# Patient Record
Sex: Female | Born: 1955
Health system: Southern US, Community
[De-identification: ages and names within clinical notes are randomized; demographics above are authoritative.]

## PROBLEM LIST (undated history)

## (undated) DIAGNOSIS — I509 Heart failure, unspecified: Secondary | ICD-10-CM

## (undated) DIAGNOSIS — I5030 Unspecified diastolic (congestive) heart failure: Secondary | ICD-10-CM

## (undated) DIAGNOSIS — K625 Hemorrhage of anus and rectum: Secondary | ICD-10-CM

## (undated) DIAGNOSIS — R7303 Prediabetes: Secondary | ICD-10-CM

## (undated) DIAGNOSIS — G4733 Obstructive sleep apnea (adult) (pediatric): Secondary | ICD-10-CM

## (undated) DIAGNOSIS — E669 Obesity, unspecified: Secondary | ICD-10-CM

## (undated) DIAGNOSIS — I10 Essential (primary) hypertension: Secondary | ICD-10-CM

## (undated) DIAGNOSIS — G4730 Sleep apnea, unspecified: Secondary | ICD-10-CM

## (undated) DIAGNOSIS — N179 Acute kidney failure, unspecified: Secondary | ICD-10-CM

## (undated) DIAGNOSIS — I2699 Other pulmonary embolism without acute cor pulmonale: Secondary | ICD-10-CM

## (undated) DIAGNOSIS — I82409 Acute embolism and thrombosis of unspecified deep veins of unspecified lower extremity: Secondary | ICD-10-CM

## (undated) DIAGNOSIS — M79606 Pain in leg, unspecified: Secondary | ICD-10-CM

## (undated) DIAGNOSIS — E785 Hyperlipidemia, unspecified: Secondary | ICD-10-CM

## (undated) HISTORY — DX: Essential (primary) hypertension: I10

## (undated) HISTORY — PX: APPENDECTOMY: SHX54

## (undated) HISTORY — DX: Hemorrhage of anus and rectum: K62.5

## (undated) HISTORY — DX: Acute embolism and thrombosis of unspecified deep veins of unspecified lower extremity: I82.409

## (undated) HISTORY — DX: Acute kidney failure, unspecified: N17.9

## (undated) HISTORY — DX: Prediabetes: R73.03

## (undated) HISTORY — PX: LAMINOTOMY: SHX998

## (undated) HISTORY — PX: CHOLECYSTECTOMY: SHX55

## (undated) HISTORY — PX: BACK SURGERY: SHX140

## (undated) HISTORY — DX: Unspecified diastolic (congestive) heart failure: I50.30

## (undated) HISTORY — DX: Obesity, unspecified: E66.9

## (undated) HISTORY — DX: Obstructive sleep apnea (adult) (pediatric): G47.33

## (undated) HISTORY — PX: SPINE SURGERY: SHX786

## (undated) HISTORY — DX: Hyperlipidemia, unspecified: E78.5

## (undated) HISTORY — DX: Pain in leg, unspecified: M79.606

## (undated) HISTORY — PX: ABDOMINAL HYSTERECTOMY: SHX81

---

## 2000-01-27 ENCOUNTER — Emergency Department (HOSPITAL_COMMUNITY): Admission: EM | Admit: 2000-01-27 | Discharge: 2000-01-27 | Payer: Self-pay | Admitting: Emergency Medicine

## 2000-03-21 ENCOUNTER — Encounter: Admission: RE | Admit: 2000-03-21 | Discharge: 2000-03-21 | Payer: Self-pay | Admitting: Family Medicine

## 2000-03-21 ENCOUNTER — Encounter: Payer: Self-pay | Admitting: Family Medicine

## 2000-04-17 ENCOUNTER — Observation Stay (HOSPITAL_COMMUNITY): Admission: AD | Admit: 2000-04-17 | Discharge: 2000-04-18 | Payer: Self-pay | Admitting: *Deleted

## 2000-04-17 ENCOUNTER — Encounter: Payer: Self-pay | Admitting: *Deleted

## 2000-04-23 ENCOUNTER — Other Ambulatory Visit: Admission: RE | Admit: 2000-04-23 | Discharge: 2000-04-23 | Payer: Self-pay | Admitting: *Deleted

## 2000-06-17 ENCOUNTER — Encounter (INDEPENDENT_AMBULATORY_CARE_PROVIDER_SITE_OTHER): Payer: Self-pay

## 2000-06-17 ENCOUNTER — Inpatient Hospital Stay (HOSPITAL_COMMUNITY): Admission: RE | Admit: 2000-06-17 | Discharge: 2000-06-19 | Payer: Self-pay | Admitting: *Deleted

## 2000-06-20 ENCOUNTER — Ambulatory Visit: Admission: RE | Admit: 2000-06-20 | Discharge: 2000-06-20 | Payer: Self-pay | Admitting: *Deleted

## 2001-02-02 ENCOUNTER — Inpatient Hospital Stay (HOSPITAL_COMMUNITY): Admission: EM | Admit: 2001-02-02 | Discharge: 2001-02-03 | Payer: Self-pay | Admitting: Emergency Medicine

## 2001-02-03 ENCOUNTER — Encounter: Payer: Self-pay | Admitting: Emergency Medicine

## 2001-03-26 ENCOUNTER — Encounter: Payer: Self-pay | Admitting: *Deleted

## 2001-03-26 ENCOUNTER — Encounter: Admission: RE | Admit: 2001-03-26 | Discharge: 2001-03-26 | Payer: Self-pay | Admitting: *Deleted

## 2001-04-28 ENCOUNTER — Observation Stay (HOSPITAL_COMMUNITY): Admission: RE | Admit: 2001-04-28 | Discharge: 2001-04-29 | Payer: Self-pay | Admitting: General Surgery

## 2001-04-28 ENCOUNTER — Encounter: Payer: Self-pay | Admitting: General Surgery

## 2001-04-28 ENCOUNTER — Encounter (INDEPENDENT_AMBULATORY_CARE_PROVIDER_SITE_OTHER): Payer: Self-pay | Admitting: Specialist

## 2001-11-04 ENCOUNTER — Encounter: Admission: RE | Admit: 2001-11-04 | Discharge: 2001-11-04 | Payer: Self-pay | Admitting: *Deleted

## 2001-11-04 ENCOUNTER — Encounter: Payer: Self-pay | Admitting: *Deleted

## 2002-06-03 ENCOUNTER — Encounter (INDEPENDENT_AMBULATORY_CARE_PROVIDER_SITE_OTHER): Payer: Self-pay | Admitting: *Deleted

## 2002-06-03 ENCOUNTER — Inpatient Hospital Stay (HOSPITAL_COMMUNITY): Admission: RE | Admit: 2002-06-03 | Discharge: 2002-06-06 | Payer: Self-pay | Admitting: *Deleted

## 2003-01-28 ENCOUNTER — Encounter: Payer: Self-pay | Admitting: *Deleted

## 2003-01-28 ENCOUNTER — Encounter: Admission: RE | Admit: 2003-01-28 | Discharge: 2003-01-28 | Payer: Self-pay | Admitting: *Deleted

## 2004-04-22 ENCOUNTER — Ambulatory Visit (HOSPITAL_COMMUNITY): Admission: RE | Admit: 2004-04-22 | Discharge: 2004-04-22 | Payer: Self-pay | Admitting: *Deleted

## 2004-04-25 ENCOUNTER — Other Ambulatory Visit: Admission: RE | Admit: 2004-04-25 | Discharge: 2004-04-25 | Payer: Self-pay | Admitting: Family Medicine

## 2004-05-08 ENCOUNTER — Encounter: Admission: RE | Admit: 2004-05-08 | Discharge: 2004-05-08 | Payer: Self-pay | Admitting: Family Medicine

## 2004-07-02 ENCOUNTER — Ambulatory Visit (HOSPITAL_COMMUNITY): Admission: RE | Admit: 2004-07-02 | Discharge: 2004-07-03 | Payer: Self-pay | Admitting: Neurosurgery

## 2005-05-21 ENCOUNTER — Other Ambulatory Visit: Admission: RE | Admit: 2005-05-21 | Discharge: 2005-05-21 | Payer: Self-pay | Admitting: Family Medicine

## 2005-06-12 ENCOUNTER — Ambulatory Visit (HOSPITAL_COMMUNITY): Admission: RE | Admit: 2005-06-12 | Discharge: 2005-06-12 | Payer: Self-pay | Admitting: Family Medicine

## 2005-06-19 ENCOUNTER — Ambulatory Visit (HOSPITAL_BASED_OUTPATIENT_CLINIC_OR_DEPARTMENT_OTHER): Admission: RE | Admit: 2005-06-19 | Discharge: 2005-06-19 | Payer: Self-pay | Admitting: Family Medicine

## 2005-06-23 ENCOUNTER — Ambulatory Visit: Payer: Self-pay | Admitting: Internal Medicine

## 2005-07-10 ENCOUNTER — Encounter: Admission: RE | Admit: 2005-07-10 | Discharge: 2005-07-10 | Payer: Self-pay | Admitting: Family Medicine

## 2006-12-16 ENCOUNTER — Encounter: Admission: RE | Admit: 2006-12-16 | Discharge: 2006-12-16 | Payer: Self-pay | Admitting: Family Medicine

## 2008-01-27 ENCOUNTER — Other Ambulatory Visit: Admission: RE | Admit: 2008-01-27 | Discharge: 2008-01-27 | Payer: Self-pay | Admitting: Family Medicine

## 2008-09-09 ENCOUNTER — Inpatient Hospital Stay (HOSPITAL_COMMUNITY): Admission: EM | Admit: 2008-09-09 | Discharge: 2008-09-14 | Payer: Self-pay | Admitting: Emergency Medicine

## 2008-09-09 ENCOUNTER — Encounter: Admission: RE | Admit: 2008-09-09 | Discharge: 2008-09-09 | Payer: Self-pay | Admitting: Family Medicine

## 2008-09-11 ENCOUNTER — Ambulatory Visit: Payer: Self-pay | Admitting: Vascular Surgery

## 2008-09-11 ENCOUNTER — Encounter: Payer: Self-pay | Admitting: Infectious Disease

## 2008-09-11 ENCOUNTER — Encounter (INDEPENDENT_AMBULATORY_CARE_PROVIDER_SITE_OTHER): Payer: Self-pay | Admitting: *Deleted

## 2008-10-12 ENCOUNTER — Emergency Department (HOSPITAL_COMMUNITY): Admission: EM | Admit: 2008-10-12 | Discharge: 2008-10-13 | Payer: Self-pay | Admitting: Emergency Medicine

## 2009-01-19 ENCOUNTER — Ambulatory Visit: Payer: Self-pay | Admitting: Cardiology

## 2009-01-19 ENCOUNTER — Encounter (INDEPENDENT_AMBULATORY_CARE_PROVIDER_SITE_OTHER): Payer: Self-pay | Admitting: *Deleted

## 2009-01-19 ENCOUNTER — Observation Stay (HOSPITAL_COMMUNITY): Admission: EM | Admit: 2009-01-19 | Discharge: 2009-01-20 | Payer: Self-pay | Admitting: *Deleted

## 2009-01-25 ENCOUNTER — Telehealth (INDEPENDENT_AMBULATORY_CARE_PROVIDER_SITE_OTHER): Payer: Self-pay | Admitting: *Deleted

## 2009-01-28 ENCOUNTER — Emergency Department (HOSPITAL_COMMUNITY): Admission: EM | Admit: 2009-01-28 | Discharge: 2009-01-28 | Payer: Self-pay | Admitting: Emergency Medicine

## 2009-01-30 DIAGNOSIS — E785 Hyperlipidemia, unspecified: Secondary | ICD-10-CM

## 2009-01-30 DIAGNOSIS — Z8639 Personal history of other endocrine, nutritional and metabolic disease: Secondary | ICD-10-CM

## 2009-01-30 DIAGNOSIS — G4733 Obstructive sleep apnea (adult) (pediatric): Secondary | ICD-10-CM

## 2009-01-30 DIAGNOSIS — I10 Essential (primary) hypertension: Secondary | ICD-10-CM

## 2009-01-30 DIAGNOSIS — Z86718 Personal history of other venous thrombosis and embolism: Secondary | ICD-10-CM | POA: Insufficient documentation

## 2009-01-30 DIAGNOSIS — E669 Obesity, unspecified: Secondary | ICD-10-CM | POA: Insufficient documentation

## 2009-01-30 DIAGNOSIS — IMO0001 Reserved for inherently not codable concepts without codable children: Secondary | ICD-10-CM

## 2009-01-30 DIAGNOSIS — Z862 Personal history of diseases of the blood and blood-forming organs and certain disorders involving the immune mechanism: Secondary | ICD-10-CM

## 2009-02-06 ENCOUNTER — Telehealth (INDEPENDENT_AMBULATORY_CARE_PROVIDER_SITE_OTHER): Payer: Self-pay | Admitting: *Deleted

## 2009-02-07 ENCOUNTER — Ambulatory Visit: Payer: Self-pay

## 2009-02-07 ENCOUNTER — Encounter: Payer: Self-pay | Admitting: Cardiology

## 2009-03-29 ENCOUNTER — Other Ambulatory Visit: Admission: RE | Admit: 2009-03-29 | Discharge: 2009-03-29 | Payer: Self-pay | Admitting: Family Medicine

## 2010-07-27 ENCOUNTER — Emergency Department (HOSPITAL_COMMUNITY)
Admission: EM | Admit: 2010-07-27 | Discharge: 2010-07-27 | Payer: Self-pay | Source: Home / Self Care | Admitting: Emergency Medicine

## 2010-11-26 LAB — URINE CULTURE: Colony Count: >=100000

## 2010-11-26 LAB — COMPREHENSIVE METABOLIC PANEL
ALT: 23 U/L (ref 0–35)
AST: 24 U/L (ref 0–37)
Albumin: 3.9 g/dL (ref 3.5–5.2)
Alkaline Phosphatase: 80 U/L (ref 39–117)
BUN: 9 mg/dL (ref 6–23)
CO2: 28 mEq/L (ref 19–32)
Calcium: 9.3 mg/dL (ref 8.4–10.5)
Chloride: 105 mEq/L (ref 96–112)
Creatinine, Ser: 1.07 mg/dL (ref 0.4–1.2)
GFR calc Af Amer: >60 mL/min (ref >60)
GFR calc non Af Amer: 54 mL/min — ABNORMAL LOW (ref >60)
Glucose, Bld: 88 mg/dL (ref 70–99)
Potassium: 3.3 mEq/L — ABNORMAL LOW (ref 3.5–5.1)
Sodium: 141 mEq/L (ref 135–145)
Total Bilirubin: 0.5 mg/dL (ref 0.3–1.2)
Total Protein: 7.2 g/dL (ref 6.0–8.3)

## 2010-11-26 LAB — DRUGS OF ABUSE SCREEN W/O ALC, ROUTINE URINE
Amphetamine Screen, Ur: NEGATIVE
Barbiturate Quant, Ur: NEGATIVE
Cocaine Metabolites: NEGATIVE
Creatinine,U: 46.8 mg/dL
Propoxyphene: NEGATIVE

## 2010-11-26 LAB — DIFFERENTIAL
Basophils Absolute: 0.1 10*3/uL (ref 0.0–0.1)
Basophils Relative: 1 % (ref 0–1)
Eosinophils Absolute: 0.1 10*3/uL (ref 0.0–0.7)
Eosinophils Relative: 2 % (ref 0–5)
Lymphocytes Relative: 42 % (ref 12–46)
Lymphs Abs: 3.3 10*3/uL (ref 0.7–4.0)
Monocytes Absolute: 0.6 10*3/uL (ref 0.1–1.0)
Monocytes Relative: 8 % (ref 3–12)
Neutro Abs: 3.7 10*3/uL (ref 1.7–7.7)
Neutrophils Relative %: 47 % (ref 43–77)

## 2010-11-26 LAB — POCT CARDIAC MARKERS
CKMB, poc: <1 ng/mL — ABNORMAL LOW (ref 1.0–8.0)
CKMB, poc: <1 ng/mL — ABNORMAL LOW (ref 1.0–8.0)
Myoglobin, poc: 49.6 ng/mL (ref 12–200)
Myoglobin, poc: 77.5 ng/mL (ref 12–200)
Troponin i, poc: <0.05 ng/mL (ref 0.00–0.09)
Troponin i, poc: <0.05 ng/mL (ref 0.00–0.09)

## 2010-11-26 LAB — PROTIME-INR
INR: 1.3 (ref 0.00–1.49)
INR: 1.5 (ref 0.00–1.49)
INR: 1.8 — ABNORMAL HIGH (ref 0.00–1.49)
Prothrombin Time: 17 seconds — ABNORMAL HIGH (ref 11.6–15.2)
Prothrombin Time: 19.2 seconds — ABNORMAL HIGH (ref 11.6–15.2)
Prothrombin Time: 21.8 seconds — ABNORMAL HIGH (ref 11.6–15.2)

## 2010-11-26 LAB — URINE MICROSCOPIC-ADD ON

## 2010-11-26 LAB — CARDIAC PANEL(CRET KIN+CKTOT+MB+TROPI)
CK, MB: 1.2 ng/mL (ref 0.3–4.0)
CK, MB: 1.3 ng/mL (ref 0.3–4.0)
Relative Index: 0.8 (ref 0.0–2.5)
Relative Index: 0.9 (ref 0.0–2.5)
Total CK: 146 U/L (ref 7–177)
Total CK: 155 U/L (ref 7–177)
Troponin I: 0.01 ng/mL (ref 0.00–0.06)
Troponin I: 0.02 ng/mL (ref 0.00–0.06)

## 2010-11-26 LAB — HEMOGLOBIN A1C
Hgb A1c MFr Bld: 5.5 % (ref 4.6–6.1)
Mean Plasma Glucose: 111 mg/dL

## 2010-11-26 LAB — CBC
HCT: 40 % (ref 36.0–46.0)
Hemoglobin: 13.6 g/dL (ref 12.0–15.0)
MCHC: 34 g/dL (ref 30.0–36.0)
MCV: 82.6 fL (ref 78.0–100.0)
Platelets: 271 10*3/uL (ref 150–400)
RBC: 4.84 MIL/uL (ref 3.87–5.11)
RDW: 14.4 % (ref 11.5–15.5)
WBC: 7.8 10*3/uL (ref 4.0–10.5)

## 2010-11-26 LAB — LIPID PANEL
Cholesterol: 227 mg/dL — ABNORMAL HIGH (ref 0–200)
HDL: 42 mg/dL (ref >39)
LDL Cholesterol: 164 mg/dL — ABNORMAL HIGH (ref 0–99)
Total CHOL/HDL Ratio: 5.4 RATIO
Triglycerides: 107 mg/dL (ref <150)
VLDL: 21 mg/dL (ref 0–40)

## 2010-11-26 LAB — URINALYSIS, ROUTINE W REFLEX MICROSCOPIC
Glucose, UA: NEGATIVE mg/dL
Ketones, ur: 15 mg/dL — AB
Nitrite: POSITIVE — AB
Protein, ur: 100 mg/dL — AB

## 2010-11-26 LAB — TSH: TSH: 1.67 u[IU]/mL (ref 0.350–4.500)

## 2010-11-26 LAB — APTT: aPTT: 32 seconds (ref 24–37)

## 2010-12-03 LAB — BASIC METABOLIC PANEL
BUN: 11 mg/dL (ref 6–23)
BUN: 13 mg/dL (ref 6–23)
BUN: 13 mg/dL (ref 6–23)
CO2: 22 mEq/L (ref 19–32)
Calcium: 8.9 mg/dL (ref 8.4–10.5)
Chloride: 101 mEq/L (ref 96–112)
Chloride: 106 mEq/L (ref 96–112)
Chloride: 109 mEq/L (ref 96–112)
Chloride: 109 mEq/L (ref 96–112)
Creatinine, Ser: 1.07 mg/dL (ref 0.4–1.2)
Creatinine, Ser: 1.12 mg/dL (ref 0.4–1.2)
Creatinine, Ser: 1.12 mg/dL (ref 0.4–1.2)
GFR calc Af Amer: >60 mL/min (ref >60)
GFR calc non Af Amer: 51 mL/min — ABNORMAL LOW (ref >60)
Glucose, Bld: 100 mg/dL — ABNORMAL HIGH (ref 70–99)
Glucose, Bld: 112 mg/dL — ABNORMAL HIGH (ref 70–99)
Glucose, Bld: 91 mg/dL (ref 70–99)
Glucose, Bld: 94 mg/dL (ref 70–99)
Potassium: 3.3 mEq/L — ABNORMAL LOW (ref 3.5–5.1)
Potassium: 3.8 mEq/L (ref 3.5–5.1)
Potassium: 4.1 mEq/L (ref 3.5–5.1)
Sodium: 138 mEq/L (ref 135–145)

## 2010-12-03 LAB — CBC
HCT: 40 % (ref 36.0–46.0)
HCT: 40.8 % (ref 36.0–46.0)
HCT: 42.6 % (ref 36.0–46.0)
HCT: 43.6 % (ref 36.0–46.0)
HCT: 47.1 % — ABNORMAL HIGH (ref 36.0–46.0)
Hemoglobin: 13.2 g/dL (ref 12.0–15.0)
Hemoglobin: 15.3 g/dL — ABNORMAL HIGH (ref 12.0–15.0)
MCHC: 32.5 g/dL (ref 30.0–36.0)
MCHC: 32.5 g/dL (ref 30.0–36.0)
MCHC: 32.9 g/dL (ref 30.0–36.0)
MCHC: 33.3 g/dL (ref 30.0–36.0)
MCV: 82.1 fL (ref 78.0–100.0)
MCV: 82.8 fL (ref 78.0–100.0)
MCV: 82.9 fL (ref 78.0–100.0)
MCV: 83 fL (ref 78.0–100.0)
MCV: 83.4 fL (ref 78.0–100.0)
Platelets: 226 10*3/uL (ref 150–400)
Platelets: 228 10*3/uL (ref 150–400)
Platelets: 238 10*3/uL (ref 150–400)
RBC: 4.84 MIL/uL (ref 3.87–5.11)
RBC: 5.26 MIL/uL — ABNORMAL HIGH (ref 3.87–5.11)
RBC: 5.67 MIL/uL — ABNORMAL HIGH (ref 3.87–5.11)
RDW: 13.1 % (ref 11.5–15.5)
RDW: 13.3 % (ref 11.5–15.5)
RDW: 13.5 % (ref 11.5–15.5)
RDW: 13.6 % (ref 11.5–15.5)
RDW: 13.7 % (ref 11.5–15.5)
WBC: 8.6 10*3/uL (ref 4.0–10.5)
WBC: 9.3 10*3/uL (ref 4.0–10.5)
WBC: 9.5 10*3/uL (ref 4.0–10.5)
WBC: 9.6 10*3/uL (ref 4.0–10.5)

## 2010-12-03 LAB — COMPREHENSIVE METABOLIC PANEL
ALT: 17 U/L (ref 0–35)
Alkaline Phosphatase: 91 U/L (ref 39–117)
BUN: 15 mg/dL (ref 6–23)
CO2: 21 mEq/L (ref 19–32)
Chloride: 105 mEq/L (ref 96–112)
Glucose, Bld: 101 mg/dL — ABNORMAL HIGH (ref 70–99)
Potassium: 3.4 mEq/L — ABNORMAL LOW (ref 3.5–5.1)
Sodium: 138 mEq/L (ref 135–145)
Total Bilirubin: 0.9 mg/dL (ref 0.3–1.2)
Total Protein: 7.8 g/dL (ref 6.0–8.3)

## 2010-12-03 LAB — PHOSPHORUS: Phosphorus: 3.7 mg/dL (ref 2.3–4.6)

## 2010-12-03 LAB — DIFFERENTIAL
Basophils Absolute: 0.1 10*3/uL (ref 0.0–0.1)
Basophils Relative: 1 % (ref 0–1)
Eosinophils Absolute: 0.1 10*3/uL (ref 0.0–0.7)
Monocytes Absolute: 0.6 10*3/uL (ref 0.1–1.0)
Monocytes Relative: 6 % (ref 3–12)
Neutro Abs: 5.6 10*3/uL (ref 1.7–7.7)
Neutrophils Relative %: 59 % (ref 43–77)

## 2010-12-03 LAB — PROTIME-INR
INR: 1 (ref 0.00–1.49)
Prothrombin Time: 17.8 seconds — ABNORMAL HIGH (ref 11.6–15.2)
Prothrombin Time: 28 seconds — ABNORMAL HIGH (ref 11.6–15.2)

## 2010-12-03 LAB — HEPARIN LEVEL (UNFRACTIONATED)
Heparin Unfractionated: 0.45 IU/mL (ref 0.30–0.70)
Heparin Unfractionated: 0.5 IU/mL (ref 0.30–0.70)
Heparin Unfractionated: 0.62 IU/mL (ref 0.30–0.70)
Heparin Unfractionated: 0.76 IU/mL — ABNORMAL HIGH (ref 0.30–0.70)
Heparin Unfractionated: 0.87 IU/mL — ABNORMAL HIGH (ref 0.30–0.70)

## 2010-12-03 LAB — CK TOTAL AND CKMB (NOT AT ARMC): CK, MB: 2.7 ng/mL (ref 0.3–4.0)

## 2010-12-03 LAB — TSH: TSH: 1.486 u[IU]/mL (ref 0.350–4.500)

## 2010-12-03 LAB — FACTOR 5 LEIDEN

## 2010-12-04 LAB — PROTIME-INR: INR: 2.4 — ABNORMAL HIGH (ref 0.00–1.49)

## 2011-01-01 NOTE — Discharge Summary (Signed)
[name], [name]             ACCOUNT NO.:  [id number]   MEDICAL RECORD NO.:  [id number]          PATIENT TYPE:  INP   LOCATION:  6741                         FACILITY:  [facility]   PHYSICIAN:  Beckey Rutter, MD  DATE OF BIRTH:  Mar 31, 1956   DATE OF ADMISSION:  09/09/2008  DATE OF DISCHARGE:  09/14/2008                               DISCHARGE SUMMARY   PRIMARY CARE PHYSICIAN:  [name], MD   CHIEF COMPLAINT:  Dyspnea, dizziness, and nausea.   HISTORY OF PRESENT ILLNESS:  [name] is a 55 year old pleasant  African American female with past medical history significant for  obstructive sleep apnea, hypertension, ovarian cyst with acute onset of  dyspnea and chest pain on presentation.  The patient was found to have  pulmonary embolism and she was managed for that during her hospital  stay.   HOSPITAL PROCEDURE/TEST/IMAGING:  The patient had CT angiogram on the  day of admission on September 09, 2008.  The impression was bilateral  pulmonary emboli with right heart strain.  The patient's factor V gene  mutation is negative.  The rest of hyperanticoagulable state is not in  the computer/pending.  CBC is showing white blood count of 8.3,  hemoglobin 13.2, hematocrit is 40.5, and platelet count is 228.  Sodium  is 138, potassium 4.1, chloride is 109, bicarb is 22, glucose is 94, BUN  is 11, and creatinine is 1.1.   The patient has 2D echo with impression reading, overall left  ventricular systolic function was normal.  Left ventricular ejection  fraction was estimated to be 55%.  This study was inadequate for the  evaluation of left ventricular regional wall motion.  There was an  increased relative contribution of atrial contraction to the left  ventricular filling.   The right ventricle was moderately dilated.  Right ventricular systolic  function was mildly to moderately reduced.  Estimated peak right  ventricular systolic pressure was 50 mmHg.   Moderate pulmonary  hypertension.   There was mild-to-moderate tricuspid valvular regurgitation.   The right atrium was mildly dilated.   There is a small isolated pericardial effusion over the apical RV free  wall.   HOSPITAL COURSE BY PROBLEM:  1. Pulmonary embolism with right ventricular strain.  The patient was      started on IV heparin since admission because of intervention      anticipation since the patient has ventricular strain.  The patient      did good since then and no intervention or t-PA was required.  She      was continued since admission on heparin drip managed by our      pharmacy.  She was also started on Coumadin and INR today is      therapeutic to 2.4.  The patient did not complete 5 days on heparin      according to the VTA clerk  measure and by tomorrow, she will be      satisfied to that recommendation.  She will be stable for discharge      tomorrow.  I advised her to follow up  with Dr. [name] within 2      days for further adjustment to the Coumadin dose as per the INR.      She is aware and agreeable to this plan.  2. Obstructive sleep apnea.  The patient had her CPAP brought from      home and she remained on CPAP nightly and during sleep.  3. Hypertension secondary to hypokalemia.  The patient's      hydrochlorothiazide was discontinued.  Nevertheless, on discharge,      I will continue the patient on Diovan with hydrochlorothiazide with      potassium chloride supplementation and potassium rich vegetables      as discussed with her.  I also advised her to follow up with Dr.      Parke Simmers for further management of her hypertension and her      hypokalemia.  4. The patient was taking Premarin for postmenopausal hot flashes.  I      advised the patient to discontinue the Premarin.   DISCHARGE DIAGNOSES:  1. Bilateral pulmonary embolism, right ventricular strain.  2. Obstructive sleep apnea, on CPAP.  3. Hypertension.  4. Hypokalemia, repleted.  5.  Overweight/obesity.   DISCHARGE MEDICATIONS:  1. Diovan HCT 25/160 one tablet daily.  2. K-Dur 20 mEq p.o. daily.  3. Albuterol metered-dose inhaler.  4. Aspirin 81 mg p.o. daily.  5. Coumadin 5 mg p.o. daily, dose would be adjusted in 2-3 days      according to desired therapeutic INR between 2 and 3.   DISCHARGE PLAN:  The patient should follow up with Dr. [name] as  discussed with her.  She is aware and agreeable to discharge plan.      Beckey Rutter, MD  Electronically Signed     EME/MEDQ  D:  09/13/2008  T:  09/14/2008  Job:  [id number]   cc:   [name], M.D.

## 2011-01-01 NOTE — Consult Note (Signed)
NAME:  MEA, OZGA             ACCOUNT NO.:  [id number]   MEDICAL RECORD NO.:  [id number]          PATIENT TYPE:  OBV   LOCATION:                               FACILITY:  [facility]   PHYSICIAN:  Thomas C. Wall, MD, FACCDATE OF BIRTH:  Mar 01, 1956   DATE OF CONSULTATION:  DATE OF DISCHARGE:                                 CONSULTATION   CARDIOLOGIST:  Thomas C. Wall, MD, [facility]   We were asked to consult Ms. Scaff a 55 year old African American  female with no history of coronary artery disease, but with risk  factors.  Ms. Hessler states she was in her usual state of health  until Wednesday around midnight when a pounding sensation in her chest  woke her from deep sleep.  She states it was associated with tightness  and pressure.  She complained of being diaphoretic nausea and dizzy.  With the chest discomfort, she came to the ER where she received  nitroglycerin, sublingual morphine with relief, although the patient  states her chest was tender with palpation.  She has a sedentary  lifestyle.  She was seen back in January 2010 and diagnosed with  bilateral PEs.  She states her chest discomfort now was different from  pain associated with her pulmonary emboli.  She continues to have some  chest pain while here with negative cardiac enzymes and no acute EKG  changes.  She also had a 2-D echocardiogram checked in January showed an  EF of 55% with moderate pulmonary hypertension, mild-to-moderate  tricuspid regurgitation, right ventricle moderately dilated.   PAST MEDICAL HISTORY:  1. Uterine fibroids post total abdominal hysterectomy.  2. History of endometriosis.  3. Status post laparoscopic cholecystectomy and appendectomy.  4. Back pain secondary to L4-L5 lumbar disk herniation, status post      surgery.  5. Seizure activity 2006 with a negative EEG.  6. Moderate obstructive sleep apnea with compliance to CPAP.  7. Hypertension.  8. Bilateral pulmonary emboli in January  2010, diagnosed while on      hormone therapy.   FAMILY HISTORY:  Positive for diabetes in her mother and hypertension.  Father with known coronary artery disease, deceased in his 71s.  Sister  had bypass at age 40.   SOCIAL HISTORY:  The patient lives in [place] with her spouse.  She  denies any tobacco, EtOH, or drug use.  She has a sedentary lifestyle.   REVIEW OF SYSTEMS:  As described in history of present illness.  All  other systems reviewed and negative.   ALLERGIES:  No known drug allergies.   MEDICATIONS AT HOME:  Diovan, hydrochlorothiazide, KCl, Coumadin, and  aspirin.  Here she is also on Protonix, heparin, Zocor, and normal  saline at 125.   PHYSICAL EXAMINATION:  VITAL SIGNS:  Temperature 97.3, heart rate 83,  respirations 20, blood pressure 118/79, sat 99% on 2 liters.  GENERAL:  No acute distress.  HEENT:  Unremarkable.  NECK:  Supple without lymphadenopathy.  No bruits or JVD.  LUNGS:  Clear to auscultation bilaterally.  Cardiovascular:  S1 and S2, regular rate and rhythm.  ABDOMEN:  Soft, obese, nontender, positive bowel sounds.  EXTREMITIES:  Lower extremities with +1 pitting edema bilaterally.  NEUROLOGIC:  Alert and oriented x3.  SKIN:  Warm and dry.   DIAGNOSTICS:  CT angiogram negative for pulmonary emboli.  No residual  thrombus identified.  Chest x-ray showing atelectasis, otherwise  negative.  Lab work showing a PT of 19.2, INR 1.5.  Cardiac enzymes  negative x3.  Total cholesterol 227, triglycerides 107, HDL 42, LDL 164.  Other labs pending.   IMPRESSION:  1. Chest pain, somewhat atypical with negative cardiac enzymes and EKG      without acute findings.  2. Subtherapeutic INR with recent bilateral pulmonary emboli.  3. Hypercholesteremia.  4. Obstructive sleep apnea.  5. Hypertension.   Plan is to proceed with outpatient stress Myoview and follow up with Dr.  Daleen Squibb, attending physician to manage subtherapeutic INR.  Risk factor  reduction  has been discussed with the patient including diet, weight  reduction, statin and exercise, compliance with CPAP and hypertension  management.  Dr. Juanito Doom has been in to examine and assess the patient,  and agrees with plan of care.  I have scheduled the patient for a stress  Myoview at our office on January 26, 2009 at 7:15, follow up with Dr. Daleen Squibb  on March 04, 2009 at 9:15.      Dorian Pod, ACNP      Jesse Sans. Daleen Squibb, MD, [facility]  Electronically Signed    MB/MEDQ  D:  01/24/2009  T:  01/24/2009  Job:  [id number]

## 2011-01-01 NOTE — Discharge Summary (Signed)
NAMERONALD, VINSANT             ACCOUNT NO.:  [id number]   MEDICAL RECORD NO.:  [id number]          PATIENT TYPE:  INP   LOCATION:  5506                         FACILITY:  [facility]   PHYSICIAN:  Charlestine Massed, MDDATE OF BIRTH:  [birth year]   DATE OF ADMISSION:  01/19/2009  DATE OF DISCHARGE:  01/20/2009                               DISCHARGE SUMMARY   PRIMARY CARE PHYSICIAN:  Dr. [name].   CARDIOLOGY:  Dr. Valera Castle.   REASON FOR ADMISSION:  Chest pain.   DISCHARGE DIAGNOSES:  1. Chest pain, atypical origin, acute myocardial infarction ruled out.  2. Hypertension, currently controlled.  3. History of pulmonary embolism, on Coumadin therapy, currently being      discharged on Lovenox and Coumadin.  4. History of obstructive sleep apnea, on CPAP at home.  5. Dyslipidemia, started on medications, currently stable.   DISCHARGE MEDICATIONS:  1. Lovenox 90 mg every 12 hours subcutaneously for the next 3 days, a      total of 6 doses have been dispensed.  2. Coumadin 7.5 mg p.o. x1 dose on January 21, 2009, and January 22, 2009, and      then continue at 5 mg per day for the next 3 days.  Patient is to      see Dr. Parke Simmers on January 23, 2009, for dosing adjustment and INR check.  3. Zocor 20 mg p.o. q.h.s.  4. Diovan/hydrochlorothiazide 160/25 mg p.o. daily.  5. Potassium chloride 20 mEq p.o. daily.  6. Enteric-coated aspirin 81 mg p.o. daily.   HOSPITAL COURSE:  1. Chest pain to rule out acute MI.  Patient was admitted with a      diagnosis of chest pain.  She explained that the chest pain was      retrosternal but there was a component of increase in the chest      pain during deep inspiration.  In view of the fact that patient has      history of pulmonary embolism, a CT angiogram was done at the time      of admission to rule out pulmonary embolism which ruled out any      evidence of pulmonary embolism and no residual thrombus was      identified.  In view of that, patient  was on Coumadin already and      was continued on Coumadin.  INR check yesterday was 1.3 and today      is 1.5 so patient has been given Lovenox here in the hospital and      we continued Lovenox as outpatient for the next 3 days and with      concomitant Coumadin administration and will be seen on Monday by      Dr. [name] for further Coumadin dosing.  Patient was seen by      [facility], Dr. Valera Castle, in view of the fact that      patient has strong family history for coronary artery disease as      her sister had CABG done at the age of 64 and died of MI at the  age      of 86.  They have suggested as patient is ruled out for acute      coronary syndrome will be getting a stress test as outpatient on      January 26, 2009, and will see Dr. Valera Castle on January 31, 2009, for      further followup.  So patient is being discharged on aspirin alone      at this time.  2. Hypertension, currently stable.  We will continue      Diovan/hydrochlorothiazide at this time with potassium      supplementation.  3. Dyslipidemia.  Her LDL level is 164 so she has been started on      Zocor.  Patient needs her liver function test checked in another 2      weeks' time for any reaction to statins and then can be continued      if no complications.  4. Sleep apnea.  Continue CPAP at 12 cm of water at night as has been      advised by Pulmonology.  No further management needed.   DISPOSITION:  Patient discharged home.   FOLLOWUP:  1. Follow up with Dr. [name] on Monday, January 23, 2009, at 10 a.m.      for INR check.  I have personally spoken to Dr. Parke Simmers and she has      agreed to see her on the morning of January 23, 2009, for INR check and      further Coumadin dosing.  2. Stress test on January 26, 2009, at [facility] on [address].  Call the number mentioned on the discharge summary before      going there.  She has to be there at 7:15 a.m., go on empty      stomach.   3. Follow up with Dr. Valera Castle on January 31, 2009, for Cardiology      followup.   A total of 40 minutes was spent on this discharge.      Charlestine Massed, MD  Electronically Signed     UT/MEDQ  D:  01/20/2009  T:  01/20/2009  Job:  [id number]   cc:   [name], M.D.  Thomas C. Wall, MD, [facility]

## 2011-01-01 NOTE — H&P (Signed)
Natalie, Carey             ACCOUNT NO.:  [id number]   MEDICAL RECORD NO.:  [id number]          PATIENT TYPE:  INP   LOCATION:  5506                         FACILITY:  [facility]   PHYSICIAN:  Manus Gunning, MD      DATE OF BIRTH:  [birth year]   DATE OF ADMISSION:  01/19/2009  DATE OF DISCHARGE:                              HISTORY & PHYSICAL   ADMITTING SERVICE:  Hospitalist Service.   CHIEF COMPLAINT:  Chest pain.   HISTORY OF PRESENT ILLNESS:  [name] is a 55 year old African  American lady who presents with chest pain starting at 12 midnight  today.  She describes the pain as a sharp throbbing pressure sensation  located substernally associated with palpitations, nausea and chills.  She was awoken from sleep at its onset and claims that it is worse with  exertion, relieved with rest and relieved in the emergency room with the  use of morphine.  It does have a pleuritic component to it and claims  that the palpation does reproduce the chest pain, though a much milder  nature and not as severe.  She denies any recent travel history, denies  any strenuous activity, denies any falls, denies any trauma, denies PND,  denies orthopnea.  No shortness of breath, dyspnea on exertion, cough or  expectoration.  Denies syncope or presyncope.  Positive headache.  No  loss of consciousness, no tinnitus, no odynophagia, no dysphagia, no  neck fullness, no abdominal pain.  Also nausea and no vomiting.  No  history of polyuria or hematuria.  No constipation or diarrhea.  No  bright red blood per rectum, no melenic stools.  The patient has a  history of lower back pain.   PAST MEDICAL/SURGICAL HISTORY:  1. Hypertension.  2. Back surgery.  3. Dyslipidemia.  4. Hysterectomy.  5. Cholecystectomy.  6. History of pulmonary embolus, on Coumadin.   ALLERGIES:  NO KNOWN DRUG ALLERGIES.   FAMILY HISTORY:  Mother has hypertension.  Father had MI at the age of  68 and is deceased.  Sister  had a CABG at age of 63.   SOCIAL HISTORY:  Denies tobacco, illicits or alcohol.   HOME MEDICINES:  1. Coumadin 5 mg p.o. daily.  2. Diovan/hydrochlorothiazide 160/25 p.o. daily.  3. Aspirin 81 mg p.o. daily.  4. Potassium chloride 20 mEq p.o. daily.   REVIEW OF SYSTEMS:  Essentially a 14-point review of systems was  performed with pertinent positives and negatives as described above.   PHYSICAL EXAMINATION:  VITAL SIGNS:  At the time of presentation,  temperature 98.3, heart rate 72, respiratory rate 20, blood pressure  142/90.  O2 saturation 98%.  GENERAL:  A well-developed, well-nourished African American lady lying  in bed comfortably in no apparent distress.  HEENT:  Normocephalic, atraumatic.  Moist oral mucosa.  No thrush,  erythema or postnasal drip.  Eyes anicteric.  Extraocular muscles  intact.  Pupils are equal and react to light and accommodation.  CARDIOVASCULAR:  S1-S2 normal.  Regular rate and rhythm.  No murmurs,  rubs or gallops.  RS:  Air entry is bilaterally equal.  No rales, rhonchi or wheezes  appreciated.  ABDOMEN:  Soft, nontender, nondistended.  Positive bowel sounds.  No  organomegaly.  EXTREMITIES:  No clubbing, cyanosis or edema.  Positive bilateral  dorsalis pedis.  CNS:  Alert and oriented x3.  Cranial nerves II through XII grossly  intact.  Power, sensation and reflexes bilaterally symmetrical.  SKIN:  No skin breakdown, swelling, ulcerations or masses.  HEM/ONC:  No palpable lymphadenopathy, ecchymosis, bruising or  petechiae.  NECK:  Supple.  Good range of motion.  No thyromegaly, no carotid  bruits.  Neck veins appear within normal limits.   LABORATORY TESTS:  EKG reviewed by __________short PR interval with  normal sinus rhythm.  No signs of any arrhythmias __________ on the EKG  review.  Axis appears to be normal axis.  No signs of left ventricular  hypertrophy or left atrial enlargement.  White blood cell count 7800,  hemoglobin 13.6,  hematocrit 40, platelet count 271, polymorphs 47.  Troponin-I less than 0.5.  Myoglobin 77.5, CK-MB less than 1.  PT 17,  INR 1.3.  Sodium 141, potassium 3.3, chloride 105, CO2 of 28, glucose  88, BUN 9.  Creatinine 1.07.  T bili 0.5.  Alk phos 80, AST 24, ALT 23.  Total protein 7.2, albumin 3.9, calcium 9.3.  Repeat cardiac enzymes;  troponin-I less than 0.05, CK-MB less than 1, myoglobin 49.6,  CT  angiogram of the chest demonstrates no evidence of acute pulmonary  embolus, no __________thrombus identified.  No acute findings identified  on the chest.   DIAGNOSTIC TESTS:  Demonstrates atelectasis, otherwise no acute  cardiopulmonary abnormality.   ASSESSMENT/PLAN:  1. Chest pain, etiology of this is uncertain.  She does appear to have      a musculoskeletal component though.  The patient claims that this      is not as severe as the chest pain she experienced earlier today.      However, she does have a strong family history of premature      coronary artery disease and is hypertension and has dyslipidemia.      She denies any history of diabetes.   At this time obtain serial cardiac enzymes q.6 h., for a total of three  sets over 18 hours.  She had her first set of cardiac enzymes today at  2:40 a.m.  Once she has been observed for 18 hours total with serial  cardiac enzymes if stable, I believe the patient could be possibly  discharged.  The patient does fall into a lower risk with atypical chest  pain.  At this time, start nitroglycerin 0.4 mg sublingual q.5 minutes  p.r.n. for chest pain, max three doses per episode.  If the pain is  uncontrolled with this, use morphine 2 mg IV q.6 h., p.r.n. pain.  I  have increased aspirin to 325 mg p.o. daily and keep O2 saturation  greater than 98%.  1. Gastrointestinal and deep venous thrombosis prophylaxis.  Start      heparin 5000 units subcu q.8, Protonix 40 mg p.o. daily.  2. Hypertension.  Currently grade I.  Continue       Diovan/hydrochlorothiazide and monitor.  Also obtain a 2-D      echocardiogram to assess left ventricular systolic function, as      well as diastolic function.  At this time, the patient is      hemodynamically stable, low risk.  We will admit for monitoring.      Manus Gunning, MD  SP/MEDQ  D:  01/19/2009  T:  01/19/2009  Job:  [id number]

## 2011-01-01 NOTE — H&P (Signed)
[name], [name]             ACCOUNT NO.:  [id number]   MEDICAL RECORD NO.:  [id number]          PATIENT TYPE:  INP   LOCATION:  2924                         FACILITY:  [facility]   PHYSICIAN:  Acey Lav, MD  DATE OF BIRTH:  September 16, 1955   DATE OF ADMISSION:  09/09/2008  DATE OF DISCHARGE:                              HISTORY & PHYSICAL   PRIMARY CARE PHYSICIAN:  [name], MD   CHIEF COMPLAINT:  Dyspnea, dizziness, and nausea.   HISTORY OF PRESENT ILLNESS:  [name] is a 55 year old African  American lady with past medical history significant for obstructive  sleep apnea, hypertension, and ovarian cyst who presents with acute  onset of dyspnea, chest pain, nausea, and dizziness which began last  Sunday.  She went to bed Sunday morning after having these symptoms and  slept throughout the day.  She then on Monday again woke once more with  chest pain in the middle of her chest.  She describes as a stabbing pain  rated at 10/10 in severity, made worse with exertion.  Additionally, she  had nausea accompanying as well as dizziness.  Dizziness also made worse  with walking.  She went her primary care doctor on Monday, seen by Dr.  Parke Simmers, apparently had EKG, blood work done and was given some nebulizers  and nonsteroidals and discharged back to home.  She continue to feel  poorly and upon checkup today on Friday, September 09, 2008, she continue  to have severe dyspnea on exertion, chest pain, nausea.  She had a CT  angio performed at [facility] this showed bilateral pulmonary  emboli with a right heart strain.  I was called to admit this patient to  [facility].  The patient was brought to the emergency department and is  currently now on room 11.  Labs were still pending at the time of this  dictation.  On exam, the patient is hemodynamically stable, but her  pulse ox is about 92%.   Review of systems is pertinent for what I have described in the history  of  present illness.  She has had no recent travel.  She has no history  of blood clots previously.  She is on Premarin for hormone replacement  therapy.  She has no family history of deep venous thrombosis or  hypercoagulable states.   PAST MEDICAL HISTORY:  1. Hypertension.  2. Obstructive sleep apnea.  3. Ovarian cysts, status post total abdominal hysterectomy with      appendectomy, June 03, 2002.  Additionally, she has had lumbar      disk herniation and underwent a L4-L5 lumbar laminectomy with      microdiskectomy in July 02, 2004.  She has had a      cholecystectomy in 2002.   SOCIAL HISTORY:  She lives at home with her husband.  She does not  smoke.  She does not drink.  She does not use any recreational drugs.   FAMILY HISTORY:  Father died of coronary artery disease in his 10s, but  did not have earlier coronary artery disease.  No immediate family  in  histories with early coronary artery disease or strokes or  hypercoagulable states.   MEDICATIONS:  1. The patient is on two baby aspirin daily.  2. She is on Diovan/hydrochlorothiazide unknown dosage.  3. She is on Premarin unknown dosage as well as albuterol metered-dose      inhaler, which was prescribed at her primary care physician visit.   ALLERGIES:  No known drug allergies.   PHYSICAL EXAMINATION:  GENERAL:  Obese lady who appears uncomfortable,  but is hemodynamically stable.  HEENT: She is normocephalic and atraumatic.  Pupils are equal, round and  reactive to light.  Sclerae anicteric.  Oropharynx was clear.  CARDIOVASCULAR:  Distant heart sounds, but irregular rate and rhythm  without murmurs, gallops, or rubs.  LUNGS:  Clear to auscultation bilaterally without wheezes, rhonchi, or  rales.  ABDOMEN:  Soft, nondistended, and nontender.  Positive bowel sounds.  EXTREMITIES: Without edema.   LABORATORY DATA:  All pending at time of this dictation.  CT angiogram  as described above.   ASSESSMENT AND  PLAN:  This is a 55 year old African American lady who is  on hormone replacement therapy with Premarin who presents with bilateral  massive of pulmonary emboli.  She has had symptoms since last Sunday,  but has not received heparin yet.  Pulmonary emboli was just diagnosed  today.  1. Pulmonary emboli, massive with right ventricular strain:  I talked      to Dr. Molli Knock from [facility] and he preferred that the      patient be on intravenous heparin in case the patient should need      thrombolytics due to hypotension or severe deterioration of the      patient.  I am placing her intravenous heparin dose by pharmacy and      we will add Coumadin at the appropriate time.  We will check      prothrombin  mutation, factor V Leiden as well as anticardiolipin      antibodies.  Protein Cand S level testing will need to be deferred      until she finishes her anticoagulant therapy.  2. Obstructive sleep apnea.  Need to check with the patient what type      of device she uses to sleep at night.  We will place her CPAP      versus BiPAP.  3. Hypertension.  I will continue her on her home medication.  4. Hormone replacement.  I am holding her Premarin.  5. Prophylaxis.  The patient is fully anticoagulated.  I will place on      proton pump inhibitor due to fact that she is anticoagulated.  6. Code status.  Full code.      Acey Lav, MD  Electronically Signed     CV/MEDQ  D:  09/09/2008  T:  09/10/2008  Job:  [id number]   cc:   [name], M.D.

## 2011-01-04 NOTE — Discharge Summary (Signed)
NAME:  [name], BOZARTH                       ACCOUNT NO.:  [id number]   MEDICAL RECORD NO.:  [id number]                   PATIENT TYPE:  INP   LOCATION:  9321                                 FACILITY:  [facility]   PHYSICIAN:  [place] B. Earlene Plater, M.D.               DATE OF BIRTH:  [birth year]   DATE OF ADMISSION:  06/03/2002  DATE OF DISCHARGE:  06/06/2002                                 DISCHARGE SUMMARY   ADMISSION DIAGNOSES:  1. Left ovarian cyst.  2. Left lateral quadrant pain.  3. History of endometriosis.   DISCHARGE DIAGNOSES:  1. Left ovarian cyst.  2. Left lateral quadrant pain.  3. History of endometriosis.  4. Appendiceal mass.   PROCEDURES:  Exploratory laparotomy with lysis of adhesions, left salpingo-  oophorectomy and appendectomy.  Co-surgeons Dr. Earlene Plater and Dr. Abbey Chatters.   HISTORY OF PRESENT ILLNESS:  For complete details, please see the History  and Physical in the chart.  The patient presented as a 55 year old, African-  American female, G4, P2, A2 with history of endometriosis, status post  TAH/RSO in October 2001, with persistent left lower quadrant pain.  This has  not responded to medical management and she presents for definitive surgical  care.  Previous laparoscopic visualization at the time of laparoscopic  cholecystectomy showed densities from the left ovary to the sigmoid colon  and ovarian appearance consistent with endometrioma.   HOSPITAL COURSE:  On the day of admission, the patient underwent exploratory  laparotomy, lysis of adhesions and left salpingo-oophorectomy and  appendectomy.  Findings at the time of surgery included dense adhesions from  the left ovary to the sigmoid colon posteriorly and the bladder anteriorly.  Also, the appendix was incidentally noted to have a 1.5 cm distal  appendiceal mass and no other abnormalities were noted at the time of  surgery.   Postoperatively, the patient was able to ambulate, void and tolerate a  regular  diet.  She was discharged home on postop day #3, in satisfactory  condition.   DISCHARGE MEDICATIONS:  1. Tylox one to two tablets every four to six hours p.r.n. pain.  2. Climara patch 0.05 mg daily, change weekly.   FOLLOW UP:  Follow up at [facility] with Dr. Earlene Plater for staple removal  and in four weeks for postop visit.   CONDITION ON DISCHARGE:  Satisfactory condition.    ACTIVITY:  No heavy lifting with nothing in the vagina x6 weeks.  No driving  x2 weeks.   SPECIAL INSTRUCTIONS:  Call with fever, wound problems, nausea, vomiting or  other issues.                                               Gerri Spore B. Earlene Plater, M.D.    WBD/MEDQ  D:  06/06/2002  T:  06/07/2002  Job:  [id number]

## 2011-01-04 NOTE — Consult Note (Signed)
[facility]  Patient:    [name], [name]                            MRN: [id number] Adm. Date:  [id number] Attending:  Ardeen Fillers CC:         Marina Gravel, M.D.             Geraldo Pitter, M.D.                          Consultation Report  REFERRING PHYSICIAN:          Marina Gravel, M.D.  REASON FOR CONSULTATION:      Right lower quadrant pain.  HISTORY OF PRESENT ILLNESS:   [name] is a 55 year old female who had the onset of her menstrual cycle on Saturday.  Along with this, she had some significant crampy low abdominal pain and heavy flow.  The pain was relatively constant and persisted and worsened last night, leading her to go to the emergency department at [facility].  An evaluation was performed, and she was told to seek attention from a gynecologist.  She saw Dr. Earlene Plater in the office today.  Most of the pain and physical findings were in the right lower quadrant-right pelvis area.  An ultrasound was performed in his office, which showed a few small ovarian cysts but no free fluid.  No dominant cyst was seen.  She subsequently underwent a CT scan at [facility].  This demonstrated a normal-appearing appendix with no inflammatory changes.  No free fluid.  There were bilateral small ovarian cysts present.  There was no inflammatory process throughout the entire abdomen noted.  She also had a urinalysis in Dr. Earlene Plater office, which was within normal limits, and a white blood cell count, which was normal at 7100.  PAST MEDICAL HISTORY:         Chronic sinusitis.  PAST SURGICAL HISTORY:        No previous abdominal surgeries.  ALLERGIES:                    None known.  MEDICATIONS:                  Premarin, oxycodone p.r.n., and Darvocet p.r.n.  REVIEW OF SYSTEMS:            GASTROINTESTINAL:  She denies peptic ulcer disease, diverticulitis, hepatitis, or yellow jaundice.  GENITOURINARY:  She denies any kidney stones or recent  urinary tract infection.  PHYSICAL EXAMINATION:  GENERAL:                      A mildly obese female, in no acute distress, pleasant and cooperative.  VITAL SIGNS:                  She is afebrile.  HEENT:                        Her sclerae are clear.  NECK:                         Supple without palpable masses.  RESPIRATORY:                  Lungs are clear to auscultation.  ABDOMEN:  Soft and obese.  There is mild to moderate tenderness in the right lower quadrant-right pelvic area.  This is inferomedial to McBurneys point area.  No masses are palpated.  There are no peritoneal signs present.  Active bowel sounds are heard.  IMPRESSION:                   Right lower quadrant/right pelvic pain. Findings are inconsistent with acute appendicitis at this time.  I would have expected a significant leukocytosis and some sort of inflammatory reaction based on a thin-cut CT scan.  Pain onset did coincide with initiation of her menstrual cycle and may be related to that.  PLAN:                         I agree with admission and observation.  I would recheck her white blood cell count on April 18, 2000, and I will recheck her while she is in the hospital. DD:  04/17/00 TD:  04/18/00 Job: [id number]

## 2011-01-04 NOTE — Op Note (Signed)
NAME:  [name], [name]                       ACCOUNT NO.:  [id number]   MEDICAL RECORD NO.:  [id number]                   PATIENT TYPE:  INP   LOCATION:  9321                                 FACILITY:  [facility]   PHYSICIAN:  [place] B. Earlene Plater, M.D.               DATE OF BIRTH:  Jan 02, 1956   DATE OF PROCEDURE:  06/03/2002  DATE OF DISCHARGE:                                 OPERATIVE REPORT   PREOPERATIVE DIAGNOSES:  1. Persistent left ovarian cyst.  2. Left lower quadrant pain.  3. Pelvic adhesions.   POSTOPERATIVE DIAGNOSES:  1. Persistent left ovarian cyst.  2. Left lower quadrant pain.  3. Pelvic adhesions.  4. Appendiceal mass.   PROCEDURE:  1. Exploratory laparotomy.  2. Lysis of adhesions.  3. Left salpingo-oophorectomy.  4. Appendectomy.   SPECIMENS:  Left tube and ovary and appendix.   SURGEON:  Chester Holstein. Earlene Plater, M.D., Adolph Pollack, M.D.   ANESTHESIA:  General.   FINDINGS:  Cystic left ovary approximately 4 cm in diameter consistent with  endometrioma.  Appendiceal mass and adhesions from the left adnexa to the  bladder and sigmoid colon requiring sharp dissection.   ESTIMATED BLOOD LOSS:  100 cc.   URINE:  300.   FLUIDS:  1500.   COMPLICATIONS:  None.   DRAINS:  Foley.   INDICATIONS:  The patient with persistent symptomatic left ovarian mass and  known history of endometriosis.  Clinical suspicion for endometrioma.   PROCEDURE:  The patient was taken to the operating room and general  anesthesia obtained.  She was prepped and draped in a standard fashion and  Foley catheter inserted into the bladder.   A vertical midline incision was made from the symphysis just below the  umbilicus.  The dissection was carried sharply to underlying fascia.  The  fascia was divided sharply and this also afforded entry into the abdomen.   The pelvis was inspected with the above findings noted.  The bowel was  packed superiorly with moist packs.  Left adnexa was  inspected and was found  to be densely adherent to the bladder anteriorly and to the sigmoid colon  posteriorly.  Dr. Abbey Chatters freed up the ovaries from the anterior surface  of the sigmoid colon sharply and together we dissected away the adhesions  from the ovaries to the bladder anteriorly.   Next, the retroperitoneal space on the left side was entered by entering the  peritoneum over the psoas muscle.  The peritoneal incision was extended  superiorly and adhesions from the superior portion of the sigmoid colon were  dissected off the peritoneum sharply.  Retroperitoneal space was developed  bluntly and the course of the ureter identified.  The left infundibulopelvic  ligament was isolated away from the ureter, doubly clamped, divided, and  suture ligated x2 with 0 Vicryl with hemostasis obtained.  The ureter was  noted to course near the area of  the left adnexa more inferiorly.  Therefore, a tonsil was used to bluntly dissect the ureter away from this  area.  The left adnexa was then free minus the residual left uterine ovarian  stump.  This was clamped with a curved Heaney clamp, divided sharply, and  suture ligated x2 with 0 Vicryl with hemostasis obtained.  The pelvis was  irrigated and inspected.  There was one bleeder at the previous uterine  ovarian ligament stump on the distal side which was cauterized with Bovie  with hemostasis obtained.   Subsequently, Dr. Abbey Chatters reinspected the colon and performed  appendectomy.  Please see his dictation for full details.   Subsequently, the pelvis was reirrigated and inspected and all lines of  dissection were hemostatic.  Therefore, the procedure was terminated.  The  packs were removed.  Self retaining retractor removed, and the bowel  returned to anatomical position.  The fascia was closed with running double  stranded 0 PDS suture.  Subcutaneous tissue was irrigated and made  hemostatic with the Bovie.  The skin was closed with  staples.   The patient tolerated procedure well.  There were no complications.  She was  taken to the recovery room awake, alert, in stable condition.  All counts  correct per the operating room staff.                                               Gerri Spore B. Earlene Plater, M.D.    WBD/MEDQ  D:  06/03/2002  T:  06/03/2002  Job:  [id number]

## 2011-01-04 NOTE — H&P (Signed)
[facility]  Patient:    [name], [name]                            MRN: [id number] Adm. Date:  [id number] Disc. Date: [id number] Attending:  Ardeen Fillers                         History and Physical  PREOPERATIVE DIAGNOSES 1. Dysmenorrhea and menorrhagia. 2. Multiple uterine fibroids, possible endometrial polyp on saline ultrasound.  HISTORY OF PRESENT ILLNESS:  [name] is a 55 year old African-American, gravida 4, para 2, A2, who I have been caring for over the last five to six weeks, with a history of lower abdominal pain and heavy vaginal bleeding.  She was initially seen on April 17, 2000 in the office with severe abdominal pain and irregular bleeding.  She had previously been treated by her primary physician, Dr. Parke Simmers, with Premarin to attempt to control her abnormal bleeding.  On the day of our initial contact, patient was examined in the office and was noted to have right lower quadrant pain.  Ultrasound was performed which confirmed multiple uterine fibroids, overall uterus size 12 x 5 x 6 cm.  She was admitted to [facility] for observation and rule out appendicitis. CT scan of abdomen and pelvis was performed which revealed multiple fibroids but no other abnormality.  In addition, she was seen in consultation by Dr. Adolph Pollack of general surgery, who did not feel that her pain was GI in origin.  Her pain resolved spontaneously and patient has been managed as an outpatient since that time.  She has been seen on numerous occasions with continued attempts to manage her irregular bleeding and pain.  She has been treated with narcotic analgesics and nonsteroidal analgesics, including oxycodone, Naproxen and Celebrex, without relief of her pain; in addition, she has been tried on Premarin and Aygestin to stop her bleeding.  Thus far, no success.  She has had a normal TSH, FSH and prolactin and a negative pregnancy test during  this evaluation.  I attempted to perform endometrial biopsy in the office, however, was unsuccessful in passing the cannula into the uterine cancer, presumably due to distortion by uterine fibroids.  Saline ultrasound was performed which did show findings consistent with a possible endometrial polyp, and again, multiple fibroids noted.  Given that we have been unsuccessful in managing the patients pain, she presents for definitive management with abdominal hysterectomy.  I informed the patient that she may indeed have an endometrial polyp which could be contributing to her bleeding; however, it is exceedingly unlikely that this is the source of her pain.  PAST MEDICAL HISTORY:  None.  PAST SURGICAL HISTORY:  None.  CURRENT MEDICATIONS:  Aygestin and Celebrex.  ALLERGIES:  None.  SOCIAL HISTORY:  No alcohol, tobacco or other drugs.  FAMILY HISTORY:  Mother with diabetes.  Father:  History of MI.  Other members with hypertension.  REVIEW OF SYSTEMS:  CONSTITUTIONAL:  Patient has reported intermittent dizzy spells and visual disturbances.  ENT:  Chronic nose and sinus trouble. CARDIOVASCULAR:  No chest pain or irregular heart beat.  RESPIRATORY:  No cough or shortness of breath.  GASTROINTESTINAL:  Has had intermittent nausea and vomiting and abdominal pain.  GENITOURINARY:  Abnormal uterine bleeding as outlined above.  MUSCULOSKELETAL:  No joint or muscle pain.  SKIN:  No recent lesions.  BREASTS:  No new  masses.  NEUROLOGICAL:  Patient has problems with headaches.  PSYCHIATRIC:  No workup of any problems.  No history of sexual assault or domestic violence.  ENDOCRINE:  No hot flushes, thyroid disease or diabetes.  HEMATOLOGIC:  No history of easy bruising.  PHYSICAL EXAMINATION  VITAL SIGNS:  Blood pressure 130/86, temperature 98.6, weight 175 pounds, height 5 feet 2 inches.  GENERAL:  The patient is alert and oriented, in no acute distress.  HEART:  Regular rate and  rhythm.  LUNGS:  Clear to auscultation.  ABDOMEN:  Mildly obese.  Active bowel sounds.  She does have mild right lower quadrant tenderness.  No acute signs.  PELVIC:  Normal external female genitalia.  Vagina and cervix normal.  Uterus: Difficult to estimate size due to body habitus.  No cervical motion tenderness.  No adnexal tenderness; however, she is tender in the right lower quadrant.  LABORATORY AND X-RAY FINDINGS:  Ultrasound has been performed, as outlined above.  Last Pap smear was April 24, 2000 and was within normal limits. Endometrial biopsy has been attempted; however, no tissue returned.  ASSESSMENT:  Menorrhea and dysmenorrhea most likely due to uterine fibroids. Patient understands there is no guarantee that hysterectomy will completely resolve her pain; however, it will completely resolve her abnormal uterine bleeding.  I do believe her pain is related to fibroids and hope is that with hysterectomy, this will also resolve.  Plan is for total abdominal hysterectomy.  Patient desires to retain her ovaries if they appear normal.  Operative risks including infection, bleeding, damage to bowel, bladder or surrounding organs were discussed.  All questions answered and no guarantees given.  Plan is to proceed as outlined above on August 30th at [facility]. DD:  05/27/00 TD:  05/27/00 Job: [id number]

## 2011-01-04 NOTE — Discharge Summary (Signed)
[facility]  Patient:    [name], [name]                            MRN: [id number] Adm. Date:  [id number] Disc. Date: [id number] Attending:  Ardeen Fillers                           Discharge Summary  ADMISSION DIAGNOSIS:          Severe abdominal pain.  DISCHARGE DIAGNOSIS:          Abdominal pain resolved.  PROCEDURES:                   CT scan of abdomen and pelvis.  HISTORY OF PRESENT ILLNESS:   For complete details, please see the previously dictated History & Physical.  [name] was admitted for overnight observation for new onset of five-day history of increasing abdominal pain.  On the day of admission, it had become severe and localized to the right lower quadrant. She also had nausea and decreased appetite.  She was afebrile with a normal white count.  HOSPITAL COURSE:              The patient was admitted to [facility] for observation.  CT scan of abdomen and pelvis with contrast showed normal appendix and no other significant findings.  A few small ovarian cysts were noted which were compatible with previously noted cyst on pelvic ultrasound.  The patient was placed on a liquid diet and was offered pain medicine as needed.  However, she did not require medication for pain control overnight, and her pain resolved spontaneously.  She remained afebrile.  Repeat white count in the morning was stable at 7.3.  The patient was discharged home in satisfactory condition.  Dr. Arne Cleveland recommendations included a bland diet for a few days and followup with him as needed.  FOLLOWUP:                     The patient will follow up with me next week in the office on September 5.  DISCHARGE INSTRUCTIONS:       Notify us immediately with increasing pain, fever, nausea, vomiting.  DISCHARGE MEDICATIONS:        Prescriptions for Phenergan and Darvocet were already obtained by the patient prior to admission.  She will use these as needed and  notify us should they not be effective. DD:  04/18/00 TD:  04/19/00 Job: [id number]

## 2011-01-04 NOTE — Discharge Summary (Signed)
[facility]  Patient:    [name], [name]                    MRN: [id number] Adm. Date:  [id number] Disc. Date: 06/19/00 Attending:  Marina Gravel B                           Discharge Summary  ADMISSION DIAGNOSES: 1. Dysmenorrhea. 2. Menorrhagia.  DISCHARGE DIAGNOSES: 1. Dysmenorrhea. 2. Menorrhagia. 3. Endometriosis.  HISTORY OF PRESENT ILLNESS:  For complete details, please see the History and Physical in the chart; however, in brief Ms. Long is a 55 year old African-American female who presented with dysmenorrhea and menorrhagia which was not responding to medical management.  She presented for definitive surgical therapy.  HOSPITAL COURSE:  On the day of admission the patient underwent total abdominal hysterectomy and right salpingo-oophorectomy.  Operative findings included fibroid uterus and findings consistent with endometriosis on the right ovary and uterus.  There were no complications, and the patient tolerated procedure well.  Postoperatively, the patient had a rapid return to ability to ambulate, void, and tolerate a regular diet.  She stayed afebrile with stable vital signs throughout her stay.  Her postop hemoglobin was 11.9.  She was discharged to home in satisfactory condition.  DISCHARGE INSTRUCTIONS:  Standard preprinted instructions were given to the patient prior to discharge.  DISCHARGE MEDICATIONS:  Tylox one to two tablets q.4-6h. p.r.n. pain.  FOLLOWUP:  The patient instructed to return to office tomorrow for removal of staples and in four to six weeks for routine postop visit.  FINAL PATHOLOGY:  Revealed 2011 g uterus with adenomyosis and benign leiomyoma.  Also, uterine serosal and right ovarian endometriosis. DD:  06/19/00 TD:  06/19/00 Job: [id number]

## 2011-01-04 NOTE — Procedures (Signed)
NAME:  [name], [name]             ACCOUNT NO.:  [id number]   MEDICAL RECORD NO.:  [id number]          PATIENT TYPE:  OUT   LOCATION:  SLEEP CENTER                 FACILITY:  [facility]   PHYSICIAN:  Clinton D. Maple Hudson, M.D. DATE OF BIRTH:  [birth year]   DATE OF STUDY:                              NOCTURNAL POLYSOMNOGRAM   REFERRING PHYSICIAN:  Dr. [name].   DATE OF STUDY:  June 19, 2005.   INDICATION FOR STUDY:  Hypersomnia with sleep apnea.   EPWORTH SLEEPINESS SCORE:  11/24.   BMI:  35.   WEIGHT:  198 pounds.   Home medications Benicar and potassium.   SLEEP ARCHITECTURE:  Total sleep time 369 minutes with sleep efficiency 85%.  Stage I was 5%, stage II 67%, stages III and IV 6%, REM 22% of total sleep  time. Sleep latency 19 minutes, REM latency 125 minutes, awake after sleep  onset 46 minutes, arousal index 8. No bedtime medication was reported.   RESPIRATORY DATA:  Split study protocol. Apnea/hypopnea index (AHI, RDI)  24.9 obstructive events per hour indicating moderate obstructive sleep  apnea/hypopnea syndrome before C-PAP. There are 28 obstructive apneas and 29  hypopneas before C-PAP. Events were not positional. REM AHI 17.2 per hour. C-  PAP was successfully titrated to 12 CWP, AHI 1 per hour. A small ComfortGel  mask was used with heated humidifier.   OXYGEN DATA:  Moderate snoring with oxygen desaturation to a nadir of 78%.  Mean saturation on C-PAP was 96-98% on room air.   CARDIAC DATA:  Normal sinus rhythm.   MOVEMENT/PARASOMNIA:  Occasional leg jerk, insignificant.   IMPRESSION/RECOMMENDATIONS:  1.  Moderate obstructive sleep apnea/hypopnea syndrome, AHI 24.9 per hour      with moderate snoring and oxygen desaturation to a nadir of 78%.  2.  Successful C-PAP titration to 12 CWP, AHI 1 per hour. A small ComfortGel      mask was used with heated humidifier.      Clinton D. Maple Hudson, M.D.  Diplomate, Biomedical engineer of Sleep Medicine  Electronically  Signed     CDY/MEDQ  D:  06/23/2005 16:17:50  T:  06/24/2005 01:40:29  Job:  [id number]

## 2011-01-04 NOTE — H&P (Signed)
NAME:  [name], [name]                       ACCOUNT NO.:  [id number]   MEDICAL RECORD NO.:  [id number]                   PATIENT TYPE:  INP   LOCATION:  NA                                   FACILITY:  [facility]   PHYSICIAN:  Mingo B. Earlene Plater, M.D.               DATE OF BIRTH:  [birth year]   DATE OF ADMISSION:  06/03/2002  DATE OF DISCHARGE:                                HISTORY & PHYSICAL   PREOPERATIVE DIAGNOSIS:  Persistent left adnexal mass and left lower  quadrant pain with a history of endometriosis status post total abdominal  hysterectomy/right salpingo-oophorectomy in the past.   INTENDED PROCEDURE:  Exploratory laparotomy/left salpingo-  oophorectomy/prophylactic appendectomy.   HISTORY OF PRESENT ILLNESS:  The patient is a 55 year old African-American  female gravida 4, para 2, A2, status post TAH/RSO October 2001 for pelvic  pain, menorrhagia and dysmenorrhea.  The pain was right lower quadrant at  that time.  Operative findings showed a 12-week uterus with fibroids and  lesions around the right ovary consistent with endometriosis.  At that time  the left tube and ovary appeared normal.   Since that time the patient has developed chronic persistent left lower  quadrant pain not responding to narcotic pain medications.  This has been  visualized at subsequent laparoscopic cholecystectomy by Dr. Abbey Chatters  where I visualized left ovary to appear most consistent with endometrioma,  at that time there were dense adhesions noted from the left ovary to the  sigmoid colon and it was felt that it was not feasible to do the procedure  laparoscopically.  The patient therefore returns for laparotomy at this  time.   MEDICAL HISTORY:  None.   PAST SURGICAL HISTORY:  Otherwise negative.   MEDICATIONS:  Bextra and Percocet.   ALLERGIES:  None.   SOCIAL HISTORY:  No alcohol, tobacco, or drugs.   FAMILY HISTORY:  Mother has diabetes.  Father with history of heart disease.  Mother and sister with hypertension.   REVIEW OF SYSTEMS:  Otherwise negative.   PHYSICAL EXAMINATION:  VITAL SIGNS: Blood pressure 122/90, temperature 98.6,  weight 179.  GENERAL: Alert and oriented in no acute distress.  SKIN: Warm and dry; no lesions.  HEART: Regular rate and rhythm.  LUNGS: Clear to auscultation.  ABDOMEN: Previous Pfannenstiel and laparoscopic cholecystectomy scars are  noted.  Liver/spleen normal; no hernia.  PELVIC: Normal external genitalia.  Vaginal cuff normal.  Left adnexal  tenderness noted.   ASSESSMENT:  Persistent left lower quadrant pain and probably endometrioma  with associated bowel to the sigmoid colon.   PLAN:  Exploratory laparotomy, lysis of adhesions, left salpingo-  oophorectomy.  Operative risks discussed including infection, bleeding,  damage to bowel, bladder, surrounding organs.  All questions answered.  The  patient wishes to proceed.  The patient instructed specifically that the  risk of bowel injury might be slightly higher due to the dense adhesions and  she will have a bowel prep prior to surgery.                                               Gerri Spore B. Earlene Plater, M.D.    WBD/MEDQ  D:  05/31/2002  T:  05/31/2002  Job:  [id number]   cc:   Adolph Pollack, M.D.  Fax: [phone]

## 2011-01-04 NOTE — Op Note (Signed)
[facility]  Patient:    [name], [name] Visit Number: [id number] MRN: [id number]          Service Type: SUR Location: 4W 0447 01 Attending Physician:  Arlis Porta Proc. Date: 04/28/01 Admit Date:  04/28/2001   CC:         Marina Gravel, M.D.   Operative Report  PREOPERATIVE DIAGNOSIS:  Symptomatic cholecystitis.  POSTOPERATIVE DIAGNOSIS:  Chronic calculus cholecystitis.  PROCEDURE:  Laparoscopic cholecystectomy.  SURGEON:  Adolph Pollack, M.D.  ASSISTANTRiley Lam A. Magnus Ivan, M.D.  ANESTHESIA:  General.  INDICATIONS:  Ms. Reierson is a 55 year old female, who went to the emergency department at [facility] under a year ago with right lower quadrant pain.  A CT scan at that time was negative but did show some cholelithiasis.  Recently, however, she developed right upper quadrant pain, radiating through to her back and was classic for biliary colic.  Ultrasound confirmed gallstones with a normal diameter common bile duct.  Her liver functions were normal, and she presents for elective cholecystectomy. Dr. Earlene Plater has noted a left ovarian cyst, and he is going to start the operation with diagnostic laparoscopy.  TECHNIQUE:  After Dr. Earlene Plater had completed his diagnostic laparoscopy and the Hasson trocar was left in the subumbilical region, she was placed in the reverse Trendelenburg position, rotating the right side partially up.  An 11 mm trocar was placed through an epigastric incision and two 5 mm trocars placed through the right and mid abdomen.  Fairly dense omental adhesions to the gallbladder were noted, and these were taken down both bluntly and sharply.  The fundus of the gallbladder was grasped and retracted toward the right shoulder.  I continued to dissect on the gallbladder, taking down adhesions until I noted a large stone in the infundibulum.  The infundibulum was then able to be retracted laterally, and  I completely mobilized the infundibulum.  I was able to expose the cystic duct and create a window by isolating it both anterior and posterior around the window.  The cystic artery was also isolated and a window created similar to the one for the cystic duct. I clamped the cystic duct three times, staying inside, twice on the gallbladder side, and divided it.  The cystic artery was then clipped and divided.  The gallbladder was then dissected free from the liver bed using electrocautery.  The gallbladder fossa was irrigated, and I noted no bile leakage or bleeding.  I placed the gallbladder in an Endopouch bag and removed it through the subumbilical port.  Subsequently, I reinspected the abdominal cavity with Dr. Earlene Plater.  There were some omental adhesions to the lower midline which limited his visualization during the original procedure, and I was able to take these down bluntly and sharply.  However, this still only gave him limited visualization.  At this time, all of the irrigation fluid was evacuated.  I subsequently removed all of the trocars and released the pneumoperitoneum.  The subumbilical fascial defect was closed by tightening up and tying down the pursestring suture.  The skin incisions were closed with 4-0 Monocryl subcuticular stitches followed by Steri-Strips and sterile dressings.  She tolerated the procedure well without any apparent complications and was taken to the recovery room in satisfactory condition. Attending Physician:  Arlis Porta DD:  04/28/01 TD:  04/28/01 Job: [id number]

## 2011-01-04 NOTE — Op Note (Signed)
[facility]  Patient:    [name], [name]                    MRN: [id number] Proc. Date: 06/17/00 Adm. Date:  [id number] Attending:  Marina Gravel B                           Operative Report  PREOPERATIVE DIAGNOSIS:  Menorrhagia and dysmenorrhea (pain primarily in the right lower quadrant with dysmenorrhea).  POSTOPERATIVE DIAGNOSIS:  Menorrhagia and dysmenorrhea (pain primary in the right lower quadrant with dysmenorrhea).  PROCEDURE:  Total abdominal hysterectomy, right salpingo-oophorectomy.  SURGEON:  Marina Gravel, M.D.  ASSISTANT:  Pershing Cox, M.D.  ANESTHESIA:  General.  FINDINGS:  Uterus enlarged to approximately 12 weeks size consistent with fibroids.  Endometrial cavity opened and no lesions seen.  Bladder flap and right ovary with "powder burn" lesions consistent with endometriosis.  Also, simple right ovarian cyst.  Normal left tube and ovary.  ESTIMATED BLOOD LOSS:  200 cc.  URINE OUTPUT:  200 cc.  FLUIDS:  2600 cc.  COMPLICATIONS:  None.  DESCRIPTION OF PROCEDURE:  The patient was taken to the operating room and general anesthesia obtained.  She was placed in the supine position and examined under anesthesia.  A slightly enlarged uterus was palpated.  No adnexal masses are palpable.  Rectovaginal exam confirmed the above findings. She was prepped and draped in the standard fashion and Foley catheter inserted in the bladder.  A Pfannenstiel incision was made with the knife and carried sharply to the underlying fascia.  The fascia was divided in the midline and the incision extended laterally with the Mayo scissors.  The Kocher clamp was used to elevate the superior aspect of the incision, and the underlying rectus muscles were dissected off sharply.  Repeated inferiorly in a similar fashion.  The midline of the rectus muscles was identified.  The underlying peritoneum elevated with hemostats and entered sharply with  Metzenbaum scissors. Extended superiorly and inferiorly sharply with adequate visualization of the surrounding organs.  The patient was placed in the Trendelenburg position and a Balfour self-retaining retractor was placed into the abdomen.  The bowel was packed superiorly with moist packs.  The pelvis was inspected with the above findings noted.  Given the patients right lower quadrant pain and ovarian findings consistent with endometriosis, the decision was made to proceed with right salpingo-oophorectomy in addition to total abdominal hysterectomy.  The left round ligament was placed on traction and suture ligated.  It was divided with Bovie cautery and the posterior leaf of the broad ligament incised sharply.  The retroperitoneal space was developed bluntly and the ureter identified.  The bladder flap was created sharply.  The left utero-ovarian ligament was isolated, clamped, divided, and suture ligated with 0 Vicryl x 2 with hemostasis obtained.  On the right side, the round ligament was identified, placed on traction, and suture ligated, and divided.  The retroperitoneal space was developed in a similar fashion and the ureter identified.  The right infundibulopelvic ligament was isolated, doubly clamped, divided, and suture ligated x 2 with 0 Vicryl, and hemostasis obtained.  The bladder flap was then created with sharp technique.  The uterine arteries were then skeletonized, clamped on each side, divided, and suture ligated with 0 Vicryl with hemostasis obtained.  The cardinal ligaments were then serially clamped with straight Heaney clamp, divided, and suture ligated with 0 Vicryl, and hemostasis obtained.  This was continued to the level of the external cervical os.  The bladder was kept mobilized 2 cm distal to this area at all times.  The rectovaginal space was entered sharply and this allowed better mobilization of the distal portion of the cervix as the cervix was fairly  long.  Curved Heaney clamps were then used at the external cervical os and the vagina entered sharply.  The remainder of the vaginal incision was continued with heavy scissors.  The angled sutures were closed in a figure-of-eight stitch with 0 Vicryl.  The remainder of the cuff was closed in a running stitch of 0 Vicryl with reapproximation of the posterior peritoneum included in this closure.  The pelvis was irrigated and the vaginal cuff inspected.  It was hemostatic. The bladder flap was also hemostatic.  All packs were removed from the abdomen and the Balfour retractor removed as well.  The subfascial space was inspected and was hemostatic.  The fascia was then closed in a running stitch with 0 Vicryl.  The subcutaneous tissue was irrigated and made hemostatic with the Bovie.  The skin was closed with staples.  The patient tolerated the procedure well.  There were no complications.  She was taken to the recovery room awake, alert, and in stable condition.  All counts were correct per the operating room staff. DD:  06/17/00 TD:  06/17/00 Job: [id number]

## 2011-01-04 NOTE — Op Note (Signed)
[facility]  Patient:    [name], [name] Visit Number: [id number] MRN: [id number]          Service Type: SUR Location: 4W 0447 01 Attending Physician:  Arlis Porta Proc. Date: 04/28/01 Admit Date:  04/28/2001                             Operative Report  PREOPERATIVE DIAGNOSES:  Left adnexal mass, history of endometriosis.  POSTOPERATIVE DIAGNOSES:  Left adnexal mass, history of endometriosis.  PROCEDURE:  Diagnostic laparoscopy.  SURGEON:  Marina Gravel, M.D.  ANESTHESIA:  General.  FINDINGS:  Apparent left endometrioma adhesed to the sigmoid colon posteriorly and to the pelvic sidewall laterally to the left.  Also a 2 mm powder burn lesion in the right gutter consistent with endometriosis.  All omentum adhesed to the anterior abdominal wall.  DESCRIPTION OF PROCEDURE:  The patient was taken to the operating room, and general anesthesia obtained.  She was placed in the ski position and examined under anesthesia.  A left adnexal mass was palpable.  The patient was prepped and draped in a standard fashion, and a Foley catheter inserted into the bladder.  A sponge stick was then inserted into the vagina.  Attention was then turned to the abdomen.  A 10 mm vertical infraumbilical skin fold incision was made with the knife.  This was carried sharply to the underlying fascia.  The fascia was divided sharply and elevated with Kocher clamps.  The underlying peritoneum and posterior sheath were divided sharply with the knife.  Omentum was visualized at that point.  A pursestring suture of 0 Vicryl was then placed around the fascial defect and the Hasson cannula inserted and secured with the pursestring suture.  An intra-abdominal placement was then confirmed with laparoscope and pneumoperitoneum obtained with CO2 gas.  The abdomen and pelvis were inspected with the above-findings noted.  It was my opinion, based on the operative  findings, that safe laparoscopic salpingo-oophorectomy was not feasible.  Therefore, Dr. Abbey Chatters was notified, and he entered the room to proceed with laparoscopic cholecystectomy.  Please see his dictation for details in that regard.  After the cholecystectomy was complete, Dr. Abbey Chatters took down the omental adhesions to the anterior abdominal wall with blunt technique.  Please see his dictation for details.  This allowed better visualization of the pelvis and again, dense adhesions were noted from the apparent endometrioma to the sigmoid colon posteriorly. Despite blunt manipulation, these adhesions were noted to be quite dense, and adequate mobilization of the ovary could not be obtained.  Therefore, the procedure was terminated.  The individual ports were removed and their sites closed per Dr. Arne Cleveland dictation.  The patient tolerated the procedure well, and there were no complications. She was in stable condition, and Dr. Abbey Chatters was closing when I exited the room. Attending Physician:  Arlis Porta DD:  04/28/01 TD:  04/28/01 Job: [id number]

## 2011-01-04 NOTE — Procedures (Signed)
HISTORY OF PRESENT ILLNESS:  This is a 55 year old patient with history of  episode of a seizure-like event in September 2006.  The patient is being  evaluated for possible seizures.   MEDICATIONS:  1.  Potassium.  2.  Benicar.   DESCRIPTION:  This is a routine EEG.  No skull defects are noted.   EEG CLASSIFICATION:  Normal awake and drowsy.   DESCRIPTION OF RECORDING:  This recording consisted of a fairly well  modulated medium amplitude alpha rhythm of 9 Hertz with reactive eye opening  and closing.  As his record progresses, the patient appears initially to be  in the awakened state.  At times, the patient will drift off to drowsiness  with some theta frequency slowing seen in a bilateral symmetric fashion.  Stage II sleep was never seen.  Photic stimulation is performed resulting in  the bilateral and symmetrical flow driving response.  Hyperventilation was  also performed resulting in a minimal buildup of background activity without  significant slowing seen.  At no time during the recording does there appear  to be evidence of spike or spike wave discharges or focal slowing.  EKG  monitor shows no evidence of cardiac rhythm abnormalities with a heart rate  of 66.   IMPRESSION:  This is a normal EEG recording in the awake and drowsy state.  No evidence of ictal or inner ictal discharges were seen.      Marlan Palau, M.D.  Electronically Signed     OAC:ZYSA  D:  06/12/2005 11:30:17  T:  06/12/2005 12:36:36  Job #:  [id number]

## 2011-01-04 NOTE — Op Note (Signed)
[name], [name]             ACCOUNT NO.:  [id number]   MEDICAL RECORD NO.:  [id number]          PATIENT TYPE:  OIB   LOCATION:  2899                         FACILITY:  [facility]   PHYSICIAN:  Hewitt Shorts, M.D.DATE OF BIRTH:  [birth year]   DATE OF PROCEDURE:  07/02/2004  DATE OF DISCHARGE:                                 OPERATIVE REPORT   PREOPERATIVE DIAGNOSIS:  Right L4-5 lumbar disk herniation, lumbar  degenerative disk disease, lumbar spondylosis and lumbar radiculopathy.   POSTOPERATIVE DIAGNOSIS:  Right L4-5 lumbar disk herniation, lumbar  degenerative disk disease, lumbar spondylosis and lumbar radiculopathy.   OPERATION PERFORMED:  Right L4-5 lumbar laminotomy and microdiskectomy.   SURGEON:  Hewitt Shorts, M.D.   ASSISTANT:  Stefani Dama, M.D.   ANESTHESIA:  General endotracheal.   INDICATIONS FOR PROCEDURE:  The patient is a 55 year old woman who presented  with a right lumbar radiculopathy and was found by MRI scan to have a right  L4-5 lumbar disk herniation.  A decision was made to proceed with elective  laminotomy and microdiskectomy.   DESCRIPTION OF PROCEDURE:  The patient was brought to the operating room and  placed under general endotracheal anesthesia.  The patient was turned to a  prone position.  Lumbar region was prepped with Betadine soap and solution  and draped in sterile fashion.  X-ray was taken and the L4-5 level was  identified.  The midline was infiltrated with local anesthetic with  epinephrine and then a midline incision was made over the L4-5 level and  dissection was carried down to the subcutaneous tissue.  Bipolar cautery and  electrocautery were used to maintain hemostasis.  Dissection was carried  down to the lumbar fascia which was incised on the right side of the midline  in the paraspinal muscles.  We dissected the spinous process and lamina in  subperiosteal fashion.  The L4-5 level was identified using x-ray  localization and then the operating microscope was draped and brought into  the field to provide additional magnification, illumination and  visualization and the remainder of the decompression was performed using  microdissection and microsurgical technique. A laminotomy was performed  using the X-Max drill and Kerrison punches.  The ligamentum flavum was  carefully resected and we were able to identify the thecal sac and exiting  right L5 nerve root.  The epidural veins in the ventral aspect of the  epidural space were coagulated and divided.  The annulus of the L4-5 disk  identified.  The disk herniation was noted extending inferiorly and  compressing the exiting right L5 nerve root.  We initiated diskectomy by  incising the annulus and removed the herniated fragment of disk and then  entered into the disk space and proceeded with thorough diskectomy removing  all loose fragments of disk material and in the end, all loose fragments of  disk material were removed from both the disk space and the epidural space  and good decompression of the thecal sac and exiting nerve root was  achieved.  Hemostasis was established with the use of bipolar cautery.  Mild  osteophytic  overgrowth from the posterior aspect of L5 was removed and once  hemostasis was established and decompression was completed, we irrigated the  wound extensively with bacitracin solution and instilled 2 mL of fentanyl  and 80 mg of Depo-Medrol into the epidural space and proceeded with closure.  The deep fascia was closed with interrupted undyed #1 Vicryl sutures, the  subcutaneous and subcuticular layer were closed with interrupted inverted 2-  0 undyed Vicryl sutures and skin edges closed with Dermabond.  The patient  tolerated the procedure well.  The estimated blood loss for this procedure  was less than 25 mL.  Sponge, needle and instrument counts were correct.  Following surgery the patient was turned back to supine  position to be  reversed from anesthetic, extubated and transferred to recovery room for  further care where she was noted to be moving all four extremities to  command.       RWN/MEDQ  D:  07/02/2004  T:  07/02/2004  Job:  [id number]

## 2011-01-04 NOTE — Op Note (Signed)
NAME:  [name], [name]                       ACCOUNT NO.:  [id number]   MEDICAL RECORD NO.:  [id number]                   PATIENT TYPE:  INP   LOCATION:  9321                                 FACILITY:  [facility]   PHYSICIAN:  Adolph Pollack, M.D.            DATE OF BIRTH:  January 17, 1956   DATE OF PROCEDURE:  06/03/2002  DATE OF DISCHARGE:                                 OPERATIVE REPORT   PREOPERATIVE DIAGNOSES:  1. Persistent left ovarian cyst with left lower quadrant pain.  2. Pelvic adhesions.   POSTOPERATIVE DIAGNOSES:  1. Persistent left ovarian cyst with left lower quadrant pain.  2. Pelvic adhesions.  3. Appendiceal mass.   PROCEDURES:  1. Exploratory laparotomy.  2. Lysis of adhesions.  3. Left salpingo-oophorectomy.  4. Appendectomy.   SURGEON:  Adolph Pollack, M.D.   Mammie LorenzoGerri Spore B. Earlene Plater, M.D.   ANESTHESIA:  General.   INDICATIONS FOR PROCEDURE:  The patient is a 55 year old female with a  persistent left ovarian cyst.  She also has left lower quadrant pain.  During her laparoscopic cholecystectomy approximately a year to a year and a  half ago, a cyst was identified but also dense adhesions.  She is now  brought to the operating room for the above procedure.   TECHNIQUE:  The patient was placed on the operating table and general  anesthetic was administered.  The abdomen was sterilely prepped and draped.  A lower midline incision was made excising the skin and subcutaneous tissue,  fascia, and peritoneum.  The bladder was identified and not injured.  Upon  entering the abdominal cavity, exploration was performed.  There was a large  ovarian cystic mass adherent to the anterior surface of the distal sigmoid  colon as well as to the bladder.  Adhesions between the colon and the ovary  were taken down sharply.  Likewise, the adhesions between the ovary and the  bladder were taken down sharply.  No defects in the colon or bladder were  made.  Dr. Earlene Plater  then appreciated with the left salpingo-oophorectomy.   Following this, I identified the appendix and noticed a distal 1.5-cm  appendiceal mass.  I mobilized the appendix and divided the mesoappendix and  ligated the vessels with silk ties then placed a pursestring suture around  the cecum.  I ligated the appendix just proximal to its takeoff from the  cecum and I then excised the appendix.  It was sent to pathology for  evaluation.   The appendiceal stump was then inverted by tightening up and tying down the  pursestring suture.   The areas were irrigated with saline solution and bleeding points controlled  with the cautery.  Sponge, instrument, and needle counts were reportedly  correct.  The midline fascia was closed with a running PDS suture.  Subcutaneous tissue was irrigated and skin closed with staples.   She tolerated the procedure well without apparent complications  and was  taken to the recovery room in satisfactory condition.  I did have some  concern about the appendiceal mass.  It could be inflammatory but also could  be carcinoid and will have to wait on the final pathology for this.                                                Adolph Pollack, M.D.    Kari Baars  D:  06/03/2002  T:  06/03/2002  Job:  [id number]   cc:   Gerri Spore B. Earlene Plater, M.D.  [address]  [place]  [place] [address]  Fax: [phone]

## 2011-01-04 NOTE — H&P (Signed)
[facility]  Patient:    [name], [name] Visit Number: [id number] MRN: [id number]          Service Type: MED Location: 1800 [id number] [id number] Attending Physician:  Arlis Porta Dictated by:   Marina Gravel, M.D. Admit Date:  02/02/2001 Discharge Date: 02/03/2001                           History and Physical  PREOPERATIVE DIAGNOSIS:  Persistent left adnexal mass and left lower quadrant pain with a history of endometriosis.  INTENDED PROCEDURE:  Diagnostic laparoscopy and possible left salpingo-oophorectomy.  HISTORY OF PRESENT ILLNESS:  Patient is a 55 year old African-American female, gravida 4, para 2, A2, status post TAH and RSO in October 2001 for persistent pelvic pain with menorrhagia and dysmenorrhea.  At that time, her pain was primarily right lower quadrant.  Operative findings at that time showed 12-week-size uterus with fibroids and powder-burn lesions around the right ovary, with normal-appearing left tube and ovary.  Since that time, patient has developed progressive left lower quadrant pain that is not responding to oral pain medications.  In addition, a left adnexal mass has been noted to be persistent on serial ultrasound for greater than two months.  Patient has upcoming laparoscopic cholecystectomy with Dr. Adolph Pollack and the plan is for a combined procedure.  PAST MEDICAL HISTORY:  None.  PAST SURGICAL HISTORY:  TAH/RSO as outlined above.  MEDICATIONS:  Bextra one p.o. daily.  ALLERGIES:  None.  SOCIAL HISTORY:  No alcohol, tobacco or drugs.  FAMILY HISTORY:  Mother has diabetes.  Father with history of heart disease. Mother and sister with hypertension.  REVIEW OF SYSTEMS:  CONSTITUTIONAL:  No unexplained weight change or fever. EYES:  Patient does have problems with vision.  ENT:  Occasional trouble with nose and sinuses.  CARDIOVASCULAR:  No chest pain or irregular heart beat. RESPIRATORY:  No cough or shortness  of breath.  GASTROINTESTINAL:  No nausea, vomiting, constipation or blood in stool.  GENITOURINARY:  No abnormal bleeding.  Patient is status post hysterectomy.  No difficulty with leaking or painful urination.  MUSCULOSKELETAL:  No joint or muscle pain.  SKIN AND BREASTS:  No lesions.  NEUROLOGIC:  History of headaches.  PSYCHIATRIC:  No work or family problems, history of domestic violence or sexual assault. ENDOCRINE:  No hot flashes, thyroid disease or diabetes.  HEMATOLOGIC:  Does have a history of bruising but no history of excessive surgical bleeding or known coagulopathy.  PHYSICAL EXAMINATION:  VITAL SIGNS:  Blood pressure 106/70, temperature 98.2.  Weight 169 pounds.  GENERAL:  Patient is a mildly obese African-American female in no acute distress.  HEART:  Regular rate and rhythm.  LUNGS:  Clear to auscultation.  ABDOMEN:  Liver and spleen normal.  No hernia.  Previously healed Pfannenstiel incision.  PELVIC:  Normal external female genitalia.  Vagina is normal.  Cervix and uterus absent.  Mild left adnexal tenderness.  No mass is palpable.  CLINICAL DATA:  Review of ultrasound performed in the office, latest on April 17, 2001, shows a persistent complex left ovarian cyst.  It is 5 cm in maximum diameter and the cyst is 3.3 cm in maximum diameter.  There is no increased blood flow with color Doppler.  In my opinion, it appears consistent with an endometrioma.  ASSESSMENT:  Persistent left lower quadrant pain and left ovarian cyst which is complex in appearance, history of endometriosis and  my clinical impression is of an endometrioma.  PLAN:  Laparoscopic left salpingo-oophorectomy.  Operative risks were discussed including infection and bleeding, damage to bowel, bladder or surrounding organs, all questions answered, patient wishes to proceed. Arrangements have been made for a combined procedure where Dr. Abbey Chatters will perform a laparoscopic cholecystectomy  after the left salpingo-oophorectomy. Dictated by:   Marina Gravel, M.D. Attending Physician:  Arlis Porta DD:  04/23/01 TD:  04/24/01 Job: [id number]

## 2011-01-15 ENCOUNTER — Other Ambulatory Visit: Payer: Self-pay | Admitting: Family Medicine

## 2011-01-15 ENCOUNTER — Ambulatory Visit
Admission: RE | Admit: 2011-01-15 | Discharge: 2011-01-15 | Disposition: A | Discharge status: Home / Self Care | Payer: PRIVATE HEALTH INSURANCE | Source: Ambulatory Visit | Attending: Family Medicine | Admitting: Family Medicine

## 2011-01-15 ENCOUNTER — Inpatient Hospital Stay (HOSPITAL_COMMUNITY)
Admission: AD | Admit: 2011-01-15 | Discharge: 2011-01-17 | DRG: 176 | Disposition: A | Discharge status: Home / Self Care | Payer: PRIVATE HEALTH INSURANCE | Source: Ambulatory Visit | Attending: Internal Medicine | Admitting: Internal Medicine

## 2011-01-15 DIAGNOSIS — I2699 Other pulmonary embolism without acute cor pulmonale: Principal | ICD-10-CM | POA: Diagnosis present

## 2011-01-15 DIAGNOSIS — E785 Hyperlipidemia, unspecified: Secondary | ICD-10-CM | POA: Diagnosis present

## 2011-01-15 DIAGNOSIS — N61 Mastitis without abscess: Secondary | ICD-10-CM | POA: Diagnosis present

## 2011-01-15 DIAGNOSIS — Z7982 Long term (current) use of aspirin: Secondary | ICD-10-CM

## 2011-01-15 DIAGNOSIS — G4733 Obstructive sleep apnea (adult) (pediatric): Secondary | ICD-10-CM | POA: Diagnosis present

## 2011-01-15 DIAGNOSIS — F411 Generalized anxiety disorder: Secondary | ICD-10-CM | POA: Diagnosis present

## 2011-01-15 DIAGNOSIS — F3289 Other specified depressive episodes: Secondary | ICD-10-CM | POA: Diagnosis present

## 2011-01-15 DIAGNOSIS — I809 Phlebitis and thrombophlebitis of unspecified site: Secondary | ICD-10-CM

## 2011-01-15 DIAGNOSIS — F329 Major depressive disorder, single episode, unspecified: Secondary | ICD-10-CM | POA: Diagnosis present

## 2011-01-15 DIAGNOSIS — I10 Essential (primary) hypertension: Secondary | ICD-10-CM | POA: Diagnosis present

## 2011-01-15 DIAGNOSIS — Z7901 Long term (current) use of anticoagulants: Secondary | ICD-10-CM

## 2011-01-15 DIAGNOSIS — I82629 Acute embolism and thrombosis of deep veins of unspecified upper extremity: Secondary | ICD-10-CM | POA: Diagnosis present

## 2011-01-15 DIAGNOSIS — Z86711 Personal history of pulmonary embolism: Secondary | ICD-10-CM

## 2011-01-15 LAB — COMPREHENSIVE METABOLIC PANEL
ALT: 26 U/L (ref 0–35)
Alkaline Phosphatase: 78 U/L (ref 39–117)
CO2: 29 mEq/L (ref 19–32)
Calcium: 9.5 mg/dL (ref 8.4–10.5)
Chloride: 103 mEq/L (ref 96–112)
GFR calc non Af Amer: >60 mL/min (ref >60)
Glucose, Bld: 92 mg/dL (ref 70–99)
Potassium: 3.8 mEq/L (ref 3.5–5.1)
Sodium: 141 mEq/L (ref 135–145)
Total Bilirubin: 0.6 mg/dL (ref 0.3–1.2)

## 2011-01-15 LAB — DIFFERENTIAL
Basophils Relative: 0 % (ref 0–1)
Monocytes Absolute: 0.6 10*3/uL (ref 0.1–1.0)
Monocytes Relative: 7 % (ref 3–12)
Neutro Abs: 5.5 10*3/uL (ref 1.7–7.7)

## 2011-01-15 LAB — CBC
Hemoglobin: 14.8 g/dL (ref 12.0–15.0)
MCH: 28.4 pg (ref 26.0–34.0)
MCHC: 33.8 g/dL (ref 30.0–36.0)

## 2011-01-15 LAB — PROTIME-INR: Prothrombin Time: 12.7 seconds (ref 11.6–15.2)

## 2011-01-15 LAB — MAGNESIUM: Magnesium: 2.3 mg/dL (ref 1.5–2.5)

## 2011-01-15 MED ORDER — IOHEXOL 300 MG/ML  SOLN
125.0000 mL | Freq: Once | INTRAMUSCULAR | Status: AC | PRN
  Administered 2011-01-15: 125 mL via INTRAVENOUS

## 2011-01-16 DIAGNOSIS — I2699 Other pulmonary embolism without acute cor pulmonale: Secondary | ICD-10-CM

## 2011-01-16 LAB — DIFFERENTIAL
Lymphocytes Relative: 31 % (ref 12–46)
Lymphs Abs: 2.3 10*3/uL (ref 0.7–4.0)
Monocytes Absolute: 0.4 10*3/uL (ref 0.1–1.0)
Monocytes Relative: 6 % (ref 3–12)
Neutro Abs: 4.4 10*3/uL (ref 1.7–7.7)

## 2011-01-16 LAB — BASIC METABOLIC PANEL
CO2: 26 mEq/L (ref 19–32)
Calcium: 8.3 mg/dL — ABNORMAL LOW (ref 8.4–10.5)
Chloride: 107 mEq/L (ref 96–112)
Creatinine, Ser: 0.82 mg/dL (ref 0.4–1.2)
Glucose, Bld: 149 mg/dL — ABNORMAL HIGH (ref 70–99)

## 2011-01-16 LAB — CBC
HCT: 37.5 % (ref 36.0–46.0)
Hemoglobin: 12.3 g/dL (ref 12.0–15.0)
MCH: 27.5 pg (ref 26.0–34.0)
MCHC: 32.8 g/dL (ref 30.0–36.0)
RBC: 4.47 MIL/uL (ref 3.87–5.11)

## 2011-01-17 ENCOUNTER — Emergency Department (HOSPITAL_COMMUNITY)
Admission: EM | Admit: 2011-01-17 | Discharge: 2011-01-18 | Disposition: A | Discharge status: Home / Self Care | Payer: PRIVATE HEALTH INSURANCE | Attending: Emergency Medicine | Admitting: Emergency Medicine

## 2011-01-17 DIAGNOSIS — Z7982 Long term (current) use of aspirin: Secondary | ICD-10-CM | POA: Insufficient documentation

## 2011-01-17 DIAGNOSIS — Z86718 Personal history of other venous thrombosis and embolism: Secondary | ICD-10-CM | POA: Insufficient documentation

## 2011-01-17 DIAGNOSIS — Z86711 Personal history of pulmonary embolism: Secondary | ICD-10-CM | POA: Insufficient documentation

## 2011-01-17 DIAGNOSIS — I10 Essential (primary) hypertension: Secondary | ICD-10-CM | POA: Insufficient documentation

## 2011-01-17 DIAGNOSIS — Z7901 Long term (current) use of anticoagulants: Secondary | ICD-10-CM | POA: Insufficient documentation

## 2011-01-17 DIAGNOSIS — Z9889 Other specified postprocedural states: Secondary | ICD-10-CM | POA: Insufficient documentation

## 2011-01-17 DIAGNOSIS — Z760 Encounter for issue of repeat prescription: Secondary | ICD-10-CM | POA: Insufficient documentation

## 2011-01-17 DIAGNOSIS — Z79899 Other long term (current) drug therapy: Secondary | ICD-10-CM | POA: Insufficient documentation

## 2011-01-17 LAB — LUPUS ANTICOAGULANT PANEL
DRVVT: 40.5 secs (ref 36.2–44.3)
Lupus Anticoagulant: NOT DETECTED

## 2011-01-17 LAB — DIFFERENTIAL
Basophils Relative: 0 % (ref 0–1)
Lymphs Abs: 2.6 10*3/uL (ref 0.7–4.0)
Monocytes Absolute: 0.8 10*3/uL (ref 0.1–1.0)
Monocytes Relative: 10 % (ref 3–12)
Neutro Abs: 4.1 10*3/uL (ref 1.7–7.7)

## 2011-01-17 LAB — PROTEIN C, TOTAL: Protein C, Total: 140 % (ref 72–160)

## 2011-01-17 LAB — BASIC METABOLIC PANEL
BUN: 10 mg/dL (ref 6–23)
CO2: 27 mEq/L (ref 19–32)
Chloride: 107 mEq/L (ref 96–112)
GFR calc non Af Amer: >60 mL/min (ref >60)
Glucose, Bld: 104 mg/dL — ABNORMAL HIGH (ref 70–99)
Potassium: 3.3 mEq/L — ABNORMAL LOW (ref 3.5–5.1)
Sodium: 143 mEq/L (ref 135–145)

## 2011-01-17 LAB — PROTEIN S ACTIVITY: Protein S Activity: 139 % — ABNORMAL HIGH (ref 69–129)

## 2011-01-17 LAB — CBC
Hemoglobin: 12.7 g/dL (ref 12.0–15.0)
MCH: 28 pg (ref 26.0–34.0)
MCHC: 33.4 g/dL (ref 30.0–36.0)
MCV: 83.7 fL (ref 78.0–100.0)

## 2011-01-17 LAB — BETA-2-GLYCOPROTEIN I ABS, IGG/M/A: Beta-2-Glycoprotein I IgM: 3 M Units (ref <20)

## 2011-01-17 LAB — CARDIOLIPIN ANTIBODIES, IGG, IGM, IGA: Anticardiolipin IgM: 2 MPL U/mL — ABNORMAL LOW (ref <11)

## 2011-01-17 LAB — PROTIME-INR: Prothrombin Time: 13.8 seconds (ref 11.6–15.2)

## 2011-01-22 LAB — CULTURE, BLOOD (ROUTINE X 2)
Culture  Setup Time: 201205300252
Culture: NO GROWTH

## 2011-01-24 ENCOUNTER — Inpatient Hospital Stay (HOSPITAL_COMMUNITY)
Admission: EM | Admit: 2011-01-24 | Discharge: 2011-01-30 | DRG: 684 | Disposition: A | Discharge status: Home / Self Care | Payer: PRIVATE HEALTH INSURANCE | Attending: Internal Medicine | Admitting: Internal Medicine

## 2011-01-24 ENCOUNTER — Emergency Department (HOSPITAL_COMMUNITY): Payer: PRIVATE HEALTH INSURANCE

## 2011-01-24 DIAGNOSIS — I10 Essential (primary) hypertension: Secondary | ICD-10-CM | POA: Diagnosis present

## 2011-01-24 DIAGNOSIS — N200 Calculus of kidney: Secondary | ICD-10-CM | POA: Diagnosis present

## 2011-01-24 DIAGNOSIS — Z7901 Long term (current) use of anticoagulants: Secondary | ICD-10-CM

## 2011-01-24 DIAGNOSIS — M545 Low back pain, unspecified: Secondary | ICD-10-CM | POA: Diagnosis present

## 2011-01-24 DIAGNOSIS — R319 Hematuria, unspecified: Secondary | ICD-10-CM | POA: Diagnosis present

## 2011-01-24 DIAGNOSIS — R791 Abnormal coagulation profile: Secondary | ICD-10-CM | POA: Diagnosis present

## 2011-01-24 DIAGNOSIS — Z86718 Personal history of other venous thrombosis and embolism: Secondary | ICD-10-CM

## 2011-01-24 DIAGNOSIS — Z7982 Long term (current) use of aspirin: Secondary | ICD-10-CM

## 2011-01-24 DIAGNOSIS — E785 Hyperlipidemia, unspecified: Secondary | ICD-10-CM | POA: Diagnosis present

## 2011-01-24 DIAGNOSIS — Z86711 Personal history of pulmonary embolism: Secondary | ICD-10-CM

## 2011-01-24 DIAGNOSIS — L988 Other specified disorders of the skin and subcutaneous tissue: Secondary | ICD-10-CM | POA: Diagnosis present

## 2011-01-24 DIAGNOSIS — I519 Heart disease, unspecified: Secondary | ICD-10-CM | POA: Diagnosis present

## 2011-01-24 DIAGNOSIS — N179 Acute kidney failure, unspecified: Principal | ICD-10-CM | POA: Diagnosis present

## 2011-01-24 DIAGNOSIS — G4733 Obstructive sleep apnea (adult) (pediatric): Secondary | ICD-10-CM | POA: Diagnosis present

## 2011-01-24 DIAGNOSIS — F3289 Other specified depressive episodes: Secondary | ICD-10-CM | POA: Diagnosis present

## 2011-01-24 DIAGNOSIS — F411 Generalized anxiety disorder: Secondary | ICD-10-CM | POA: Diagnosis present

## 2011-01-24 DIAGNOSIS — G8929 Other chronic pain: Secondary | ICD-10-CM | POA: Diagnosis present

## 2011-01-24 DIAGNOSIS — F329 Major depressive disorder, single episode, unspecified: Secondary | ICD-10-CM | POA: Diagnosis present

## 2011-01-24 LAB — POCT I-STAT, CHEM 8
BUN: 20 mg/dL (ref 6–23)
Calcium, Ion: 1.19 mmol/L (ref 1.12–1.32)
Creatinine, Ser: 2.1 mg/dL — ABNORMAL HIGH (ref 0.4–1.2)
Glucose, Bld: 101 mg/dL — ABNORMAL HIGH (ref 70–99)
TCO2: 20 mmol/L (ref 0–100)

## 2011-01-24 LAB — CBC
MCH: 27.8 pg (ref 26.0–34.0)
MCHC: 34.2 g/dL (ref 30.0–36.0)
MCV: 81.3 fL (ref 78.0–100.0)
Platelets: 209 10*3/uL (ref 150–400)

## 2011-01-24 LAB — URINALYSIS, ROUTINE W REFLEX MICROSCOPIC
Nitrite: NEGATIVE
Specific Gravity, Urine: 1.029 (ref 1.005–1.030)
Urobilinogen, UA: 0.2 mg/dL (ref 0.0–1.0)

## 2011-01-24 LAB — DIFFERENTIAL
Basophils Relative: 0 % (ref 0–1)
Eosinophils Absolute: 0.1 10*3/uL (ref 0.0–0.7)
Monocytes Absolute: 0.6 10*3/uL (ref 0.1–1.0)
Monocytes Relative: 7 % (ref 3–12)

## 2011-01-24 LAB — URINE MICROSCOPIC-ADD ON

## 2011-01-24 LAB — PROTIME-INR: Prothrombin Time: 41.8 seconds — ABNORMAL HIGH (ref 11.6–15.2)

## 2011-01-24 LAB — HEPATIC FUNCTION PANEL
AST: 26 U/L (ref 0–37)
Alkaline Phosphatase: 87 U/L (ref 39–117)
Bilirubin, Direct: <0.1 mg/dL (ref 0.0–0.3)
Total Bilirubin: 0.1 mg/dL — ABNORMAL LOW (ref 0.3–1.2)

## 2011-01-25 ENCOUNTER — Inpatient Hospital Stay (HOSPITAL_COMMUNITY): Payer: PRIVATE HEALTH INSURANCE

## 2011-01-25 LAB — PROTIME-INR
INR: 4.62 — ABNORMAL HIGH (ref 0.00–1.49)
Prothrombin Time: 43.5 seconds — ABNORMAL HIGH (ref 11.6–15.2)

## 2011-01-25 LAB — DIFFERENTIAL
Basophils Absolute: 0 10*3/uL (ref 0.0–0.1)
Basophils Relative: 0 % (ref 0–1)
Eosinophils Absolute: 0.1 10*3/uL (ref 0.0–0.7)
Monocytes Absolute: 1.5 10*3/uL — ABNORMAL HIGH (ref 0.1–1.0)
Neutro Abs: 5.8 10*3/uL (ref 1.7–7.7)
Neutrophils Relative %: 64 % (ref 43–77)

## 2011-01-25 LAB — COMPREHENSIVE METABOLIC PANEL
ALT: 45 U/L — ABNORMAL HIGH (ref 0–35)
BUN: 21 mg/dL (ref 6–23)
CO2: 23 mEq/L (ref 19–32)
Calcium: 8.9 mg/dL (ref 8.4–10.5)
Creatinine, Ser: 2.13 mg/dL — ABNORMAL HIGH (ref 0.4–1.2)
GFR calc non Af Amer: 24 mL/min — ABNORMAL LOW (ref >60)
Glucose, Bld: 99 mg/dL (ref 70–99)
Sodium: 135 mEq/L (ref 135–145)
Total Protein: 6.3 g/dL (ref 6.0–8.3)

## 2011-01-25 LAB — CBC
Hemoglobin: 13.2 g/dL (ref 12.0–15.0)
MCH: 27.6 pg (ref 26.0–34.0)
MCHC: 33 g/dL (ref 30.0–36.0)
Platelets: 210 10*3/uL (ref 150–400)

## 2011-01-25 LAB — MAGNESIUM: Magnesium: 2.3 mg/dL (ref 1.5–2.5)

## 2011-01-25 NOTE — H&P (Signed)
[name], SKOG             ACCOUNT NO.:  [id number]  MEDICAL RECORD NO.:  [id number]  LOCATION:  [facility]                         FACILITY:  [facility]  PHYSICIAN:  Talmage Nap, MD  DATE OF BIRTH:  [birth year]  DATE OF ADMISSION:  01/24/2011 DATE OF DISCHARGE:                             HISTORY & PHYSICAL   PRIMARY CARE PHYSICIAN:  [name], MD  CHIEF COMPLAINT:  Bloody urine noticed today while using the bathroom today.  The patient is a 55 year old obese African American female with a history of DVT, pulmonary embolism on anticoagulation with Coumadin who was said to be using the bathroom today and saw blood in her urine. She denied any associated dysuria.  She denied any history of abdominal pain.  She denied any fever.  She denied any chills.  She denied any rigor.  She denied any nausea or vomiting.  The patient claimed that she got scared and subsequently came to the emergency room to be evaluated.  PAST MEDICAL HISTORY:  Positive for; 1. Hypertension. 2. DVT. 3. Pulmonary embolism.  PAST SURGICAL HISTORY: 1. Appendectomy. 2. Multiple lower back surgery. 3. Cholecystectomy. 4. Hysterectomy.  PREADMISSION MEDICATIONS: 1. Warfarin 7.5 mg 1 p.o. daily. 2. Singulair (montelukast) 10 mg 1 p.o. nightly. 3. Simvastatin 40 mg 1 p.o. nightly. 4. Potassium chloride 20 mEq 1 p.o. daily. 5. Citalopram 20 mg 1 p.o. daily. 6. Benicar HCT olmesartan/hydrochlorothiazide 40/12.5 one p.o. daily. 7. Augmentin (amoxicillin)/clavulanic 875 mg 1 p.o. b.i.d. 8. Aspirin 81 mg p.o. daily.  ALLERGIES:  She has no known allergies.  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  FAMILY HISTORY:  Negative for any coronary artery disease.  REVIEW OF SYSTEMS:  The patient denies any history of headaches.  No blurred vision.  No nausea or vomiting.  No fever.  No chills.  No rigor.  No chest pain.  No  shortness of breath.  No cough.  No abdominal discomfort.  No diarrhea or hematochezia.  She complained of bloody urine.  No associated dysuria.  No swelling of the lower extremity.  No intolerance to heat or cold and no neuropsychiatric disorder.  PHYSICAL EXAMINATION:  GENERAL:  Obese lady not in any respiratory distress at present. VITAL SIGNS:  Blood pressure is 107/65, pulse 81, respiratory rate 18, temperature is 98.3. HEENT:  Pupils are reactive to light and extraocular muscles are intact. NECK:  No jugular venous distention.  No carotid bruit.  No lymphadenopathy. CHEST:  Clear to auscultation. CARDIAC:  Heart sounds are 1 and 2. ABDOMEN:  Obese, nontender.  Liver, spleen, kidney not palpable.  Bowel sounds are positive. EXTREMITIES:  No pedal edema. NEUROLOGIC:  Nonfocal. MUSCULOSKELETAL:  Unremarkable. SKIN:  Warty lesions on the face, head and neck.  LABORATORY DATA:  LFT normal.  Coagulation profile showed PT 41.8, INR 4.39.  Urine microscopy showed epithelial cells few, wbc 3-6, and too numerous to count rbcs, rare bacteria.  Urine microscopy showed small leukocyte esterase, negative nitrite, large hemoglobin.  Complete blood count with differential showed WBC of 8.7, hemoglobin of 13.4, hematocrit 39.2, MCV 81.2 with a platelet count of 209,  normal differential.  Chemistry showed sodium of 138, potassium of 5.3, chloride of 108, BUN is 20, creatinine is 2.10.  Imaging studies done include CT of the abdomen and pelvis without contrast which showed nonobstructing stone in the right kidney.  There is increased urine density on the right suggesting concentrated urine or possibly hemorrhage.  There is hypodense lesion in the lower pole of the left kidney.  Ultrasound recommend compound cystic nature and no specific abscess or inflammatory change demonstrated.  IMPRESSION: 1. Painless hematuria, most likely secondary to coagulopathy. 2. Renal insufficiency. 3.  Nonobstructing stone, right kidney (incidentaloma) 4. History of deep vein thrombosis/pulmonary embolism, on Coumadin. 5. Hypertension. 6. Obesity. 7. Chronic low back pain. 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  PLAN:  To admit the patient to general medical floor.  The patient will be slowly rehydrated with half-normal saline IV to go at a rate of 65 mL an hour.  Benicar and HCT will be on hold.  Blood pressure will be controlled with Norvasc 10 mg p.o. daily.  The patient will also be on Singulair 10 mg p.o. daily, citalopram 20 mg p.o. daily.  GI prophylaxis will be done with Protonix 40 mg p.o. daily and TED stockings or SCDs boots for DVT prophylaxis.  Further labs to be ordered on this patient will include repeating PT, PTT, INR in a.m.  CBC, CMP and magnesium will also be repeated in a.m.  The patient will be followed and evaluated on daily basis.     Talmage Nap, MD     CN/MEDQ  D:  01/24/2011  T:  01/25/2011  Job:  [id number]  Electronically Signed by Talmage Nap  on 01/25/2011 12:48:16 AM

## 2011-01-26 LAB — URINE CULTURE
Colony Count: 8000
Culture  Setup Time: 201206081820

## 2011-01-26 LAB — RENAL FUNCTION PANEL
Albumin: 2.9 g/dL — ABNORMAL LOW (ref 3.5–5.2)
CO2: 22 mEq/L (ref 19–32)
Chloride: 104 mEq/L (ref 96–112)
Creatinine, Ser: 2.19 mg/dL — ABNORMAL HIGH (ref 0.4–1.2)
GFR calc Af Amer: 28 mL/min — ABNORMAL LOW (ref >60)
GFR calc non Af Amer: 23 mL/min — ABNORMAL LOW (ref >60)
Sodium: 134 mEq/L — ABNORMAL LOW (ref 135–145)

## 2011-01-26 LAB — CBC
HCT: 32.8 % — ABNORMAL LOW (ref 36.0–46.0)
Hemoglobin: 10.7 g/dL — ABNORMAL LOW (ref 12.0–15.0)
MCHC: 32.6 g/dL (ref 30.0–36.0)
RBC: 3.96 MIL/uL (ref 3.87–5.11)
WBC: 7 10*3/uL (ref 4.0–10.5)

## 2011-01-26 LAB — PROTIME-INR
INR: 3.88 — ABNORMAL HIGH (ref 0.00–1.49)
Prothrombin Time: 38 seconds — ABNORMAL HIGH (ref 11.6–15.2)

## 2011-01-27 LAB — CBC
Platelets: 182 10*3/uL (ref 150–400)
RBC: 3.96 MIL/uL (ref 3.87–5.11)
RDW: 14.6 % (ref 11.5–15.5)
WBC: 5.9 10*3/uL (ref 4.0–10.5)

## 2011-01-27 LAB — DIFFERENTIAL
Basophils Absolute: 0 10*3/uL (ref 0.0–0.1)
Basophils Relative: 0 % (ref 0–1)
Eosinophils Absolute: 0.2 10*3/uL (ref 0.0–0.7)
Eosinophils Relative: 4 % (ref 0–5)
Lymphs Abs: 1.8 10*3/uL (ref 0.7–4.0)
Neutrophils Relative %: 53 % (ref 43–77)

## 2011-01-27 LAB — RENAL FUNCTION PANEL
Albumin: 2.7 g/dL — ABNORMAL LOW (ref 3.5–5.2)
CO2: 24 mEq/L (ref 19–32)
Chloride: 107 mEq/L (ref 96–112)
GFR calc Af Amer: 49 mL/min — ABNORMAL LOW (ref >60)
GFR calc non Af Amer: 40 mL/min — ABNORMAL LOW (ref >60)
Potassium: 4.1 mEq/L (ref 3.5–5.1)
Sodium: 139 mEq/L (ref 135–145)

## 2011-01-27 LAB — PROTIME-INR
INR: 2.33 — ABNORMAL HIGH (ref 0.00–1.49)
Prothrombin Time: 25.7 seconds — ABNORMAL HIGH (ref 11.6–15.2)

## 2011-01-28 LAB — BASIC METABOLIC PANEL
CO2: 25 mEq/L (ref 19–32)
Calcium: 8.5 mg/dL (ref 8.4–10.5)
GFR calc non Af Amer: 55 mL/min — ABNORMAL LOW (ref >60)
Glucose, Bld: 85 mg/dL (ref 70–99)
Potassium: 4.2 mEq/L (ref 3.5–5.1)
Sodium: 143 mEq/L (ref 135–145)

## 2011-01-28 LAB — CBC
Hemoglobin: 10.6 g/dL — ABNORMAL LOW (ref 12.0–15.0)
MCH: 27.5 pg (ref 26.0–34.0)
Platelets: 210 10*3/uL (ref 150–400)
RBC: 3.86 MIL/uL — ABNORMAL LOW (ref 3.87–5.11)

## 2011-01-28 LAB — PROTIME-INR
INR: 1.38 (ref 0.00–1.49)
Prothrombin Time: 17.2 seconds — ABNORMAL HIGH (ref 11.6–15.2)

## 2011-01-28 LAB — HEPARIN LEVEL (UNFRACTIONATED): Heparin Unfractionated: 1.68 IU/mL — ABNORMAL HIGH (ref 0.30–0.70)

## 2011-01-29 LAB — CBC
HCT: 31.8 % — ABNORMAL LOW (ref 36.0–46.0)
Hemoglobin: 10.8 g/dL — ABNORMAL LOW (ref 12.0–15.0)
MCH: 27.7 pg (ref 26.0–34.0)
MCHC: 34 g/dL (ref 30.0–36.0)
RDW: 13.8 % (ref 11.5–15.5)

## 2011-01-29 LAB — PROTIME-INR
INR: 1.27 (ref 0.00–1.49)
Prothrombin Time: 16.1 seconds — ABNORMAL HIGH (ref 11.6–15.2)

## 2011-01-29 NOTE — Discharge Summary (Signed)
NAME:  [name], [name]             ACCOUNT NO.:  [id number]  MEDICAL RECORD NO.:  [id number]           PATIENT TYPE:  I  LOCATION:  2013                         FACILITY:  [facility]  PHYSICIAN:  Rock Nephew, MD       DATE OF BIRTH:  01/29/1956  DATE OF ADMISSION:  01/15/2011 DATE OF DISCHARGE:                        DISCHARGE SUMMARY - REFERRING   PRIMARY CARE PHYSICIAN:  [name], M.D.  DISCHARGE DIAGNOSES: 1. Bilateral PE with left upper extremity thrombus, discontinued on     Lovenox, Coumadin. 2. Right breast streaks mastitis. 3. Hypertension. 4. Hyperlipidemia. 5. Depression and anxiety. 6. Obstructive sleep apnea on CPAP. 7. Grade 1 diastolic dysfunction. 8. Questionable history of seizures with negative EEGs. 9. History of headache. 10.History of endometriosis.  DISCHARGE MEDICATIONS: 1. Augmentin 875 mg by mouth twice daily. 2. Enoxaparin 90 mg subcutaneously twice daily. 3. Warfarin 7.5 mg p.o. daily. 4. Aspirin 81 mg p.o. daily. 5. Benicar/hydrochlorothiazide 40/12.5 mg p.o. daily. 6. Citalopram 20 mg p.o. daily. 7. Klor-Con 20 mEq p.o. daily. 8. Simvastatin 40 mg p.o. q. evening. 9. Singulair 10 mg p.o. q. evening.  DISPOSITION:  The patient is discharged home.  The patient's diet should be heart-healthy with 2 liters fluid restrictions.  PROCEDURES PERFORMED:  The patient had CT angiogram of the chest which showed new bilateral acute pulmonary and thromboemboli overall clot burden is moderate, no acute pulmonary parenchymal abnormality.  The patient's left upper extremity Doppler showed thrombus in left upper arm brachial vein extending below the elbow and the left radial vein.  The patient also had lower extremity Dopplers which were negative for DVTs. The patient had 2-D echocardiogram which showed left ventricle ejection fraction of 60%-65%, grade 1 diastolic dysfunction.  No heart strain.  DISPOSITION:  The patient is discharged home.  DIET:  The  patient's diet is heart-healthy with 2 liters fluid restrictions.  PROCEDURES PERFORMED:  CT angiogram of the chest showed bilateral PE, left upper extremity Doppler showed thrombosed in left upper arm brachial vein extending below the elbow in the left radial vein.  The patient's lower extremity Dopplers were negative, 2-D echocardiogram again showed a left ventricle ejection fraction of 60%-65%, wall motion was normal, grade 1 diastolic dysfunction.  CONSULTATIONS:  On this case none.  FOLLOWUP:  The patient should follow up with Dr. Renaye Carey in 1 week. The patient should also obtain an outpatient mammogram.  The patient should also have PT/INR checked with Dr. Parke Simmers on January 19, 2011 at 11:15 a.m. this was all explained to the patient.  BRIEF HISTORY OF PRESENT ILLNESS:  Chief complaint PE and cellulitis.  [name] is a 55 year old African American female with history of bilateral PE in 2010, currently off anticoagulation, history of hypertension, dyslipidemia, status post hysterectomy, presenting as a direct admit from the PCP's office with bilateral PEs and left upper extremity thrombosis.  She also had some looks mastitis going and streaks and swelling in the right breast.  HOSPITAL COURSE: 1. Possible bilateral PE left upper extremity thrombus.  The patient     was placed on heparin drip, warfarin was started.  Later the  patient was transitioned over to Lovenox and Coumadin.  The patient     was hemodynamically stable and saturating well on room air.  No     discomfort.  She will be sent home on 5 days bridge of Lovenox 1     mg/kg subcu b.i.d. also warfarin 7.5 mg p.o. daily.  Again, the     patient has a follow-up with Dr. Renaye Carey on January 19, 2011 to     check a PT/INR.  The patient was explained in detail not to     discontinue the Lovenox and continue that for at least 5 days till     Dr. Parke Simmers states it is okayed to stop. 2. Right breast streaks  mastitis.  The patient received Unasyn during     the hospitalization.  The patient had blood cultures ordered which     are still pending at this time.  The patient's mastitis looks to be     improved.  The patient was explained that the patient needs an     outpatient mammogram. 3. Hypertension.  The patient's blood pressure was controlled. 4. Hyperlipidemia.  The patient was on Zocor. 5. Depression, anxiety.  Celexa was continued. 6. Obstructive sleep apnea.  The patient uses CPAP at night during the     hospitalization. 7. Grade 1 diastolic dysfunction, stable.  The patient's blood     pressure should be controlled and the patient should be on 2 liters     fluid restriction.     Rock Nephew, MD     NH/MEDQ  D:  01/17/2011  T:  01/17/2011  Job:  [id number]  cc:   [name], M.D.  Electronically Signed by Rock Nephew MD on 01/29/2011 12:33:04 PM

## 2011-01-29 NOTE — Group Therapy Note (Signed)
  NAMETOI, STELLY             ACCOUNT NO.:  [id number]  MEDICAL RECORD NO.:  [id number]           PATIENT TYPE:  I  LOCATION:  2013                         FACILITY:  [facility]  PHYSICIAN:  Rock Nephew, MD       DATE OF BIRTH:  Dec 23, 1955                                PROGRESS NOTE   The patient's discharge diagnosis mainly was bilateral pulmonary embolism with left upper extremity thrombus.  Discharged on Lovenox and Coumadin.  It was informed to me when I walked into the hospital on January 18, 2011, at 7 a.m. that the [employer] had not approved the Lovenox and the patient had to come to emergency department to receive a Lovenox shot.  I called the patient.  I explained to the patient the importance of getting Lovenox and I told the patient that the patient under all circumstances has to get this Lovenox shot.  If there is somehow the patient was not able to obtain a Lovenox shot the patient would need to come into the emergency department to receive a shot.  I explained this in great detail to the patient.  I also explained to the patient that if the patient does not get the Lovenox shot death could resolve.  Also I called the patient's insurance company and I filled out a prior authorization form and I faxed the prior authorization form to the [employer].  The insurance company called me back and told me that they have approved the medicine and they were going to  call the pharmacy and the pharmacy is going to call the patient.  I called the patient back and I explained this to the patient.     Rock Nephew, MD     NH/MEDQ  D:  01/18/2011  T:  01/18/2011  Job:  [id number]  cc:   [name], M.D. Fax: [phone]  Electronically Signed by Rock Nephew MD on 01/29/2011 12:33:17 PM

## 2011-01-30 LAB — CBC
HCT: 31.5 % — ABNORMAL LOW (ref 36.0–46.0)
MCHC: 34.6 g/dL (ref 30.0–36.0)
MCV: 81.2 fL (ref 78.0–100.0)
Platelets: 269 10*3/uL (ref 150–400)
RDW: 13.5 % (ref 11.5–15.5)
WBC: 7.8 10*3/uL (ref 4.0–10.5)

## 2011-01-30 NOTE — H&P (Signed)
NAMEJOCELYNNE, [name]             ACCOUNT NO.:  [id number]  MEDICAL RECORD NO.:  [id number]           PATIENT TYPE:  I  LOCATION:  2013                         FACILITY:  [facility]  PHYSICIAN:  Ramiro Harvest, MD    DATE OF BIRTH:  1956-04-23  DATE OF ADMISSION:  01/15/2011 DATE OF DISCHARGE:                             HISTORY & PHYSICAL   PRIMARY CARE PHYSICIAN:  [name], M.D.  CHIEF COMPLAINT:  PE and cellulitis.  HISTORY OF PRESENT ILLNESS:  [name] is a 55 year old African American female with history of bilateral PEs in 2010 currently off anticoagulation, history of hypertension, dyslipidemia, and status post hysterectomy presenting as a direct admit from her PCP's office with bilateral PEs and left upper extremity thrombosis.  The patient states that 3 days prior to admission developed erythema, warmth with a red streak and some tenderness to palpation in the left upper extremity with some swelling.  The patient presented to the PCP's office and placed on some doxycycline.  The patient denied any fever no chills, no nausea, no vomiting, no abdominal pain, no diarrhea, no dysuria, and no weakness. The patient does endorse some shortness of breath on the day of admission with some chest pain, which is nonradiating.  The patient is also with a red erythematous streak on the right breast, which is tender to palpation.  The patient went to see the PCP and CT angiogram of the chest was done, which did show acute bilateral new PEs and her upper extremity Dopplers, which were done were consistent with a thrombosis. The patient was sent as a direct admit for further evaluation and treatment.  ALLERGIES:  No known drug allergies.  PAST MEDICAL HISTORY: 1. History of hypertension. 2. Dyslipidemia. 3. Hysterectomy. 4. Status post cholecystectomy. 5. Status was appendectomy. 6. History of PE bilaterally in 2010, currently off Coumadin. 7. History of  depression/anxiety. 8. Questionable history of seizure with negative EEGs. 9. History of headache. 10.Obstructive sleep apnea on a CPAP machine at bedtime. 11.Fibroid status post total abdominal hysterectomy. 12.Back pain secondary to L4-L5 disk herniation status post surgery. 13.History of endometriosis.  HOME MEDICATIONS: 1. Singular 10 mg p.o. at bedtime. 2. Simvastatin 40 mg p.o. daily. 3. Citalopram 20 mg p.o. daily. 4. Benicar HCT 40/12.5 mg p.o. daily. 5. Klor-Con 20 mEq p.o. daily. 6. Aspirin 81 mg p.o. daily.  SOCIAL HISTORY:  The patient is married.  No tobacco use.  No alcohol use.  No IV drug use.  The patient is in sales.  FAMILY HISTORY:  Father deceased in his 16s from acute MI.  Mother is alive at age 80 with hypertension and CABG in December 2011.  Has a paternal aunt with cancer.  Sister with breast cancer and diabetes, also did have a sister who is status post CABG at age 34.  REVIEW OF SYSTEMS:  As per HPI, otherwise negative.  PHYSICAL EXAMINATION:  VITAL SIGNS:  Temperature 98.3, pulse of 78, respirations 18, blood pressure 152/97, and satting 95% on room air. GENERAL:  The patient is well-developed, well-nourished female in no acute cardiopulmonary stress. HEENT:  Normocephalic, atraumatic.  Pupils are equal,  round and reactive to light and accommodation.  Extraocular movements intact.  Oropharynx is clear.  No lesions.  No exudates. NECK:  Supple.  No lymphadenopathy. RESPIRATORY:  Lungs are clear to auscultation bilaterally.  No wheezes. No crackles.  No rhonchi. CARDIOVASCULAR:  Regular rate and rhythm.  No murmurs, rubs, or gallops. ABDOMEN:  Soft, nontender, nondistended.  Positive bowel sounds. EXTREMITIES:  No clubbing, cyanosis, or edema.  Left upper extremity does have a red streak, which is very tender to palpation. BREAST:  Right breast does have a red streak from the midchest area to the areolar, which is very tender to palpation.  Good  pulses bilaterally. NEUROLOGIC:  The patient is alert and oriented x3.  Cranial nerves II through XII are grossly intact with no focal deficits.  LABORATORY DATA:  CT angiogram of the chest shows new bilateral acute pulmonary thromboemboli.  Overall, clot burden is moderate.  No acute pulmonary parenchymal abnormality.  Dopplers of the upper extremity shows thrombosis in the left upper arm brachial vein extending below the elbow in the left radial vein.  ASSESSMENT AND PLAN:  [name] is a 55 year old female with prior history of bilateral pulmonary embolism in the past, off of Coumadin therapy, history of hypertension, hyperlipidemia, status post hysterectomy presenting with bilateral pulmonary embolisms and left upper extremity thrombosis.  1. Bilateral pulmonary embolism/left upper extremity thrombosis/deep     vein thrombosis, questionable etiology.  This is the patient's     second pulmonary embolism and as such will need to be on     anticoagulation for life.  The patient is also complaining of right     breast tenderness to palpation with a red streak, question     malignancy.  We will check a hypercoagulable panel.  Place on IV     heparin and Coumadin.  Check a 2-D echo to rule out RV strain.     Keep left upper extremity elevated and follow. 2. Right breast streaks, questionable etiology.  Question secondary to     mastitis versus thrombosis in the mammary vein versus questionable     malignancy.  We will place the patient empirically on IV Unasyn and     continue anticoagulation.  On followup if the patient has not had a     mammogram, then she will likely benefit from a mammogram for     further evaluation and rule out breast cancer versus an MRI.  We     will need to do this as an outpatient. 3. Hypertension.  Continue home regimen, Benicar HCT. 4. Hyperlipidemia, simvastatin. 5. Depression/anxiety.  Citalopram. 6. Obstructive sleep apnea.  CPAP at  bedtime. 7. Prophylaxis, Protonix for GI prophylaxis and heparin for DVT     prophylaxis.  It has been a pleasure taking care of [name].     Ramiro Harvest, MD     DT/MEDQ  D:  01/15/2011  T:  01/15/2011  Job:  [id number]  cc:   [name], M.D.  Electronically Signed by Ramiro Harvest MD on 01/30/2011 02:38:41 PM

## 2011-01-31 NOTE — Discharge Summary (Signed)
[name], Carey             ACCOUNT NO.:  [id number]  MEDICAL RECORD NO.:  [id number]  LOCATION:  5128                         FACILITY:  [facility]  PHYSICIAN:  [name], MD    DATE OF BIRTH:  [birth year]  DATE OF ADMISSION:  01/24/2011 DATE OF DISCHARGE:                        DISCHARGE SUMMARY - REFERRING   PRIMARY CARE PRACTITIONER:  [name], MD  PRIMARY DISCHARGE DIAGNOSES: 1. Hematuria resolving. 2. Acute renal failure now resolved. 3. Coagulopathy with supratherapeutic INR on admission.  SECONDARY DISCHARGE DIAGNOSES: 1. Recent pulmonary embolism with moderate clot burden associated with     the left upper extremity thrombus, was on Lovenox and Coumadin     prior to admission. 2. Hypertension. 3. Dyslipidemia. 4. Depression. 5. Anxiety. 6. History of obstructive sleep apnea. 7. Grade 1 diastolic dysfunction. 8. Questionable history of seizures.  DISCHARGE MEDICATIONS:  Will be dictated by the discharging physician.  CONSULTATIONS:  Dr. Heloise Purpura from Urology.  BRIEF HISTORY OF PRESENT ILLNESS:  The patient is a very pleasant 55- year-old black female with a recent history of DVT and pulmonary embolism on chronic anticoagulation with Coumadin was brought to the hospital on the 7th for hematuria.  She was also complaining of right flank pain as well.  She was then admitted to the Hospitalist Service for further evaluation and treatment.  For further details, please see the history and physical that was dictated by Dr. Beverly Gust on admission.  PERTINENT LABORATORY DATA: 1. INR on admission was 4.39. 2. Hemoglobin on admission was 13.2. 3. Creatinine on admission was 2.13. 4. Urine culture was negative. 5. INR on the day of dictation is 1.27 with a hemoglobin of 10.8. 6. Chemistries done on June 11 showed a creatinine of 1.04.  PERTINENT RADIOLOGICAL STUDIES: 1. A CT of the abdomen and pelvis without contrast showed     nonobstructing stone in  the right kidney.  Increased urine density     on the right suggesting concentrated urine or possibly hemorrhage.     Hypodense lesion in the lower pole of the left kidney.  Ultrasound     recommended to confirm cystic nature. 2. Renal ultrasound showed a 3.5 cm diameter cyst demonstrating lower     pole of the left kidney corresponding with the prior CT scan     lesion.  BRIEF HOSPITAL COURSE: 1. Hematuria.  The patient was admitted to the hospital as noted above     with hematuria and right-sided flank pain.  She had evidence of     coagulopathy with supratherapeutic INR.  It was thought that the     etiology of this hematuria was probably secondary to her     supratherapeutic INR levels and her pain was probably secondary to     clot colic.  She could have very well also passed a stone that     may have scratched the ureter causing hematuria.  In any event, she     was admitted to the hospital, empirically started on Rocephin and     hydrated aggressively.  Over the course of hospital stay, her right     flank pain has completely resolved.  Her hematuria has gotten  significantly better to the extent where the hematuria is only now     intermittent.  She has been seen by Urology and they have also     cleared this patient to be started on full dose anticoagulation and     to be okay with therapeutic anticoagulation levels.  She will     follow up with Urology as an outpatient for further workup     including a cystoscopy.  An appointment apparently has been     scheduled for the 27th of this month with [facility].     Current plans are to observe her for another 24 hours as her last     hematuria was yesterday afternoon and then discharge her in the     morning if she continues to do well on overlapping Lovenox and     Coumadin. 2. Acute renal failure.  This is likely prerenal and this resolved     with hydration. 3. Hypertension.  Prior to this patient being admitted, she  was on     hydrochlorothiazide and ACE inhibitor.  However, that was     discontinued because of renal failure.  She has been maintained on     amlodipine with well controlled blood pressures. 4. Recent pulmonary embolism with moderate clot burden along with a     left upper extremity DVT.  As noted above the patient did present     to the hospital with supratherapeutic INR levels and ongoing     hematuria.  As a result, her Coumadin was placed on hold and over     the course of her hospital stay, her INR did track down slowly and     when it was 2.33, her Coumadin was restarted again.  However, her     INR continue to drop and the patient was started on a heparin     infusion yesterday.  As noted above, her overall hematuria is     significantly better and is only now intermittent in nature.  She     also has had no further hematuria since yesterday afternoon.     Current plans are to observe her in the hospital for today on     heparin and overlapping Coumadin and if she continues to do well to     switch her over to Lovenox and Coumadin and then discharge her     home.  Care management worker is currently arranging for an     outpatient followup with her primary care practitioner this coming     Friday for an INR check and further optimization of her Coumadin     levels. 5. Depression with anxiety.  This is stable.  She is maintained on     Celexa.  DISPOSITION:  The patient will probably be discharged home on June 13.  FOLLOWUP INSTRUCTIONS:  The patient will need to follow up with her primary care practitioner for an INR check in the next few days upon discharge.  The patient will follow up with [facility] on June 27 for further workup of hematuria.  TOTAL TIME SPENT COORDINATING DISCHARGE:  45 minutes.  If there are further changes to the patient's hospital course, this along with her discharge medications will be dictated by the discharging physician.     [name], MD     SG/MEDQ  D:  01/29/2011  T:  01/29/2011  Job:  [id number]  cc:   Heloise Purpura, MD [name],  M.D.  Electronically Signed by [name]  on 01/31/2011 07:41:30 PM

## 2011-02-07 NOTE — Consult Note (Signed)
[name], [name]             ACCOUNT NO.:  [id number]  MEDICAL RECORD NO.:  [id number]  LOCATION:  5128                         FACILITY:  [facility]  PHYSICIAN:  Heloise Purpura, MD      DATE OF BIRTH:  15-Aug-1956  DATE OF CONSULTATION:  01/28/2011 DATE OF DISCHARGE:                                CONSULTATION   REASON FOR CONSULTATION:  Hematuria.  HISTORY OF PRESENT ILLNESS:  This is a 55 year old female with past medical history significant for PE and left forearm DVT requiring anticoagulation.  She began noticing blood and clot material within her urine approximately 4 days ago.  She did complain of flank pain with radiation to right abdomen at that time now resolved.  She denies any fever, chills, nausea, vomiting or diarrhea at that time.  She does have a history of hematuria one-time years ago and was treated for urinary tract infection.  She denies any recent weight loss or anorexia.  During hospitalization, CT of abdomen and pelvis was performed.  It showed no hydronephrosis, no renal, ureteral or bladder mass.  There was one right renal calculus nonobstructing.  Hematuria has since resolved and has been seen intermittent with small amount of clot material.  She denies any complaints of urinary urgency, frequency or dysuria.  She does complain of urge incontinence.  PAST MEDICAL HISTORY: 1. Hypertension. 2. DVT. 3. Pulmonary embolism.  PAST SURGICAL HISTORY: 1. Appendectomy. 2. Multiple lower back surgeries. 3. Cholecystectomy. 4. Hysterectomy.  ALLERGIES:  She has no known drug allergies.  MEDICATIONS: 1. Norvasc. 2. Celexa 3. Heparin. 4. Singulair. 5. Protonix. 6. Tylenol. 7. Dilaudid p.r.n.  REVIEW OF SYSTEMS:  As stated per HPI.  FAMILY HISTORY:  She has a family history of hypertension in her mother and father.  Her father is deceased of MI.  She denies any family history of kidney cancer, bladder cancer, or prostate cancer.  SOCIAL HISTORY:   She denies any alcohol or tobacco use.  She lives in [place].  She works in an [employer] investment [employer].  PHYSICAL EXAMINATION:  VITAL SIGNS:  Temperature 98.3, pulse 87, respiration 18, blood pressure 128/70. CONSTITUTIONAL:  She is a well-developed, well-nourished white female in no acute distress, smiling and pleasant. HEENT:  Normocephalic, atraumatic.  Oropharynx is clear. ABDOMEN:  Soft, round, nontender, nondistended.  No CVA tenderness. EXTREMITIES:  Nontender.  No atrophy.  Slight left forearm edema. SKIN:  Warm, dry, intact. NEURO:  Remote and recent memory are intact.  LABORATORY DATA:  Sodium is 143, potassium 4.2, chloride 111, CO2 25, BUN 9, creatinine 1.04, glucose 85. WBC 5.9, hemoglobin 10.6, hematocrit 31.4, platelets 210, INR is 1.38.  RADIOLOGY: 1. Renal ultrasound shows 3.5 cm lower pole left renal cyst. 2. CT of abdomen and pelvis shows:     a.     Nonobstructing right renal stone.     b.     Left lower pole renal cyst.  IMPRESSION/PLAN:  Resolving/intermittent hematuria.  She does not require any urological intervention while hospitalized.  It is okay to resume further anticoagulation until therapeutic.  We will have her follow up on an outpatient basis for further evaluation including cystoscopy and further imaging study (  hematuria protocol) to rule out any renal pathology causing hematuria.  Dr. Laverle Patter will see this young lady later this afternoon.     Delia Chimes, NP   I have seen patient and agree with above findings. ______________________________ Heloise Purpura, MD    MA/MEDQ  D:  01/28/2011  T:  01/29/2011  Job:  [id number]  Electronically Signed by Delia Chimes NP on 01/29/2011 01:32:54 PM Electronically Signed by Heloise Purpura MD on 02/07/2011 11:35:28 PM

## 2011-02-14 ENCOUNTER — Telehealth: Payer: Self-pay | Admitting: Cardiology

## 2011-02-14 NOTE — Telephone Encounter (Signed)
Faxed Stress & EKG to [facility] ([phone]).

## 2011-02-14 NOTE — Telephone Encounter (Signed)
ERROR

## 2011-02-15 ENCOUNTER — Encounter: Payer: PRIVATE HEALTH INSURANCE | Admitting: Oncology

## 2011-02-18 ENCOUNTER — Encounter (HOSPITAL_BASED_OUTPATIENT_CLINIC_OR_DEPARTMENT_OTHER): Payer: PRIVATE HEALTH INSURANCE | Admitting: Oncology

## 2011-02-18 ENCOUNTER — Other Ambulatory Visit: Payer: Self-pay | Admitting: Oncology

## 2011-02-18 DIAGNOSIS — I2782 Chronic pulmonary embolism: Secondary | ICD-10-CM

## 2011-02-18 LAB — CBC WITH DIFFERENTIAL/PLATELET
BASO%: 0.3 % (ref 0.0–2.0)
Basophils Absolute: 0 10*3/uL (ref 0.0–0.1)
EOS%: 2.7 % (ref 0.0–7.0)
HCT: 36 % (ref 34.8–46.6)
HGB: 11.9 g/dL (ref 11.6–15.9)
LYMPH%: 24.4 % (ref 14.0–49.7)
MCH: 27.5 pg (ref 25.1–34.0)
MCHC: 33.1 g/dL (ref 31.5–36.0)
MCV: 83 fL (ref 79.5–101.0)
MONO%: 9.4 % (ref 0.0–14.0)
NEUT%: 63.2 % (ref 38.4–76.8)
Platelets: 291 10*3/uL (ref 145–400)
lymph#: 2 10*3/uL (ref 0.9–3.3)

## 2011-02-21 ENCOUNTER — Other Ambulatory Visit: Payer: Self-pay | Admitting: Urology

## 2011-02-21 ENCOUNTER — Encounter (HOSPITAL_COMMUNITY): Payer: PRIVATE HEALTH INSURANCE

## 2011-02-21 LAB — BASIC METABOLIC PANEL
BUN: 11 mg/dL (ref 6–23)
Creatinine, Ser: 0.87 mg/dL (ref 0.50–1.10)
GFR calc non Af Amer: >60 mL/min (ref >60)
Glucose, Bld: 90 mg/dL (ref 70–99)
Potassium: 3.8 mEq/L (ref 3.5–5.1)

## 2011-02-21 LAB — CBC
Hemoglobin: 11.9 g/dL — ABNORMAL LOW (ref 12.0–15.0)
MCH: 26.6 pg (ref 26.0–34.0)
MCHC: 32.4 g/dL (ref 30.0–36.0)
RDW: 14.1 % (ref 11.5–15.5)

## 2011-02-21 LAB — PROTIME-INR: Prothrombin Time: 20.5 seconds — ABNORMAL HIGH (ref 11.6–15.2)

## 2011-02-22 LAB — PROTHROMBIN GENE MUTATION

## 2011-02-28 ENCOUNTER — Other Ambulatory Visit: Payer: Self-pay | Admitting: Urology

## 2011-02-28 ENCOUNTER — Ambulatory Visit (HOSPITAL_COMMUNITY)
Admission: RE | Admit: 2011-02-28 | Discharge: 2011-02-28 | Disposition: A | Discharge status: Home / Self Care | Payer: PRIVATE HEALTH INSURANCE | Source: Ambulatory Visit | Attending: Urology | Admitting: Urology

## 2011-02-28 DIAGNOSIS — Z01812 Encounter for preprocedural laboratory examination: Secondary | ICD-10-CM | POA: Insufficient documentation

## 2011-02-28 DIAGNOSIS — E7800 Pure hypercholesterolemia, unspecified: Secondary | ICD-10-CM | POA: Insufficient documentation

## 2011-02-28 DIAGNOSIS — Z7982 Long term (current) use of aspirin: Secondary | ICD-10-CM | POA: Insufficient documentation

## 2011-02-28 DIAGNOSIS — I10 Essential (primary) hypertension: Secondary | ICD-10-CM | POA: Insufficient documentation

## 2011-02-28 DIAGNOSIS — R310 Gross hematuria: Secondary | ICD-10-CM | POA: Insufficient documentation

## 2011-02-28 DIAGNOSIS — Z86718 Personal history of other venous thrombosis and embolism: Secondary | ICD-10-CM | POA: Insufficient documentation

## 2011-02-28 DIAGNOSIS — Z79899 Other long term (current) drug therapy: Secondary | ICD-10-CM | POA: Insufficient documentation

## 2011-02-28 DIAGNOSIS — Z01810 Encounter for preprocedural cardiovascular examination: Secondary | ICD-10-CM | POA: Insufficient documentation

## 2011-02-28 DIAGNOSIS — R319 Hematuria, unspecified: Secondary | ICD-10-CM | POA: Insufficient documentation

## 2011-02-28 DIAGNOSIS — G4733 Obstructive sleep apnea (adult) (pediatric): Secondary | ICD-10-CM | POA: Insufficient documentation

## 2011-02-28 DIAGNOSIS — Z7901 Long term (current) use of anticoagulants: Secondary | ICD-10-CM | POA: Insufficient documentation

## 2011-02-28 LAB — PROTIME-INR: Prothrombin Time: 24.6 seconds — ABNORMAL HIGH (ref 11.6–15.2)

## 2011-02-28 LAB — APTT: aPTT: 39 seconds — ABNORMAL HIGH (ref 24–37)

## 2011-03-06 NOTE — Op Note (Signed)
[name], [name]             ACCOUNT NO.:  [id number]  MEDICAL RECORD NO.:  [id number]  LOCATION:  DAYL                         FACILITY:  [facility]  PHYSICIAN:  Heloise Purpura, MD      DATE OF BIRTH:  December 06, 1955  DATE OF PROCEDURE:  02/28/2011 DATE OF DISCHARGE:                              OPERATIVE REPORT   PREOPERATIVE DIAGNOSES: 1. Gross hematuria. 2. Right upper tract lateralizing hematuria.  POSTOPERATIVE DIAGNOSES: 1. Gross hematuria. 2. Right upper tract lateralizing hematuria.  PROCEDURES PERFORMED: 1. Cystoscopy. 2. Right retrograde pyelography with interpretation. 3. Right ureteroscopy. 4. Right renal pelvic washing for cytology. 5. Right ureteral stent placement (6 x 24).  SURGEON:  Heloise Purpura, MD.  ANESTHESIA:  General.  INTRAOPERATIVE FINDINGS:  Retrograde pyelography revealed a normal caliber ureter without evidence of filling defects.  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  SPECIMENS: 1. Right renal pelvic washing for cytology. 2. Disposition of specimen to pathology.  INDICATIONS FOR PROCEDURE:  [name] is a 55 year old female who was seen as a hospital consultation for gross hematuria.  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  During our hospitalization, she did have a noncontrast CT scan which revealed hyperdense material within the right renal pelvis, possibly concerning for a mass or blood.  She followed up as an outpatient for further evaluation and underwent cystoscopy which revealed an inflamed and erythematous right ureteral orifice without other abnormalities.  Bladder cytology was negative. She also underwent a hematuria protocol contrasted CT scan which did not demonstrate any significant abnormalities of the upper urinary tracts bilaterally.  She presents today for further  evaluation of her cystoscopic findings and what appears to be right lateralizing hematuria.  The potential risks, complications, and alternative treatment options associated with the above procedures were discussed in detail and informed consent obtained.  DESCRIPTION OF PROCEDURE:  The patient was taken to the operating room and general anesthetic was administered.  She was given preoperative antibiotics, placed in the dorsal lithotomy position, and prepped and draped in usual sterile fashion.  Next, a preoperative time-out was performed.  Cystourethroscopy was then performed with 12 and 70 degree lenses.  This revealed no evidence of any bladder tumors, stones, or urethral abnormalities.  The left ureteral orifice was noted to be in its normal anatomic position and effluxing clear urine.  The right ureteral orifice was noted to be erythematous and edematous without any definite mass or other abnormality.  A 6-French ureteral catheter was then used to intubate the right ureteral orifice and contrast was injected.  Retrograde pyelography demonstrated no ureteral or renal pelvic filling defects.  Due to the concern about the inflamed and erythematous right ureteral orifice, it was decided to perform distal right ureteroscopy prior to looking up into the renal pelvis. Therefore, a 0.38 sensor guidewire was advanced up into the right renal pelvis under fluoroscopic guidance.  The 6-French semirigid ureteroscope was then advanced next to the wire and the distal ureter was unremarkable.  The semirigid ureteroscope was able to be advanced up into the proximal ureter just below the  level of the ureteropelvic junction again without any abnormalities or tumor identified.  The semirigid ureteroscope was then removed and 12/14 short ACMI access sheath was advanced over the wire into the proximal ureter.  The flexible digital ureteroscope was then advanced through the access sheath and up into the  renal pelvis.  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  Although, no renal pelvic or calyceal filling defects or tumors are identified during her evaluation, she was noted to have diffuse erythema along the urothelium particularly in the upper pole moiety.  This appeared to be most consistent with inflammatory changes, although possible was not particularly concerning for malignancy or carcinoma in situ.  Based on the fact that she was on Coumadin for her recent pulmonary embolus, it was decided not to proceed with any biopsies.  However, a renal pelvic washing was obtained for cytology. The digital ureteroscope was removed and 0.38 sensor guidewire was replaced back up into the right renal pelvis and ureteral access sheath was removed.  The wire was back loaded on the cystoscope and 6 x 24 double-J ureteral stent with a string was placed over the wire under Seldinger technique and positioned appropriately under fluoroscopic and cystoscopic guidance.  The wire was removed with colometer and renal pelvis as well in the bladder.  The patient's bladder was emptied.  She tolerated the procedure without complications.  She was able to be awaken to transferred to the recovery unit in satisfactory condition.     Heloise Purpura, MD     LB/MEDQ  D:  02/28/2011  T:  02/28/2011  Job:  [id number]  Electronically Signed by Heloise Purpura MD on 03/06/2011 10:48:44 PM

## 2011-03-31 ENCOUNTER — Inpatient Hospital Stay (INDEPENDENT_AMBULATORY_CARE_PROVIDER_SITE_OTHER)
Admission: RE | Admit: 2011-03-31 | Discharge: 2011-03-31 | Disposition: A | Discharge status: Home / Self Care | Payer: PRIVATE HEALTH INSURANCE | Source: Ambulatory Visit | Attending: Family Medicine | Admitting: Family Medicine

## 2011-03-31 ENCOUNTER — Ambulatory Visit (INDEPENDENT_AMBULATORY_CARE_PROVIDER_SITE_OTHER): Payer: PRIVATE HEALTH INSURANCE

## 2011-03-31 DIAGNOSIS — R6889 Other general symptoms and signs: Secondary | ICD-10-CM

## 2011-06-05 ENCOUNTER — Other Ambulatory Visit (HOSPITAL_COMMUNITY): Payer: Self-pay | Admitting: Orthopedic Surgery

## 2011-06-05 ENCOUNTER — Ambulatory Visit (HOSPITAL_COMMUNITY)
Admission: RE | Admit: 2011-06-05 | Discharge: 2011-06-05 | Disposition: A | Discharge status: Home / Self Care | Payer: PRIVATE HEALTH INSURANCE | Source: Ambulatory Visit | Attending: Orthopedic Surgery | Admitting: Orthopedic Surgery

## 2011-06-05 ENCOUNTER — Encounter (HOSPITAL_COMMUNITY)
Admission: RE | Admit: 2011-06-05 | Discharge: 2011-06-05 | Disposition: A | Discharge status: Home / Self Care | Payer: PRIVATE HEALTH INSURANCE | Source: Ambulatory Visit | Attending: Orthopedic Surgery | Admitting: Orthopedic Surgery

## 2011-06-05 DIAGNOSIS — Z01812 Encounter for preprocedural laboratory examination: Secondary | ICD-10-CM | POA: Insufficient documentation

## 2011-06-05 DIAGNOSIS — Z01818 Encounter for other preprocedural examination: Secondary | ICD-10-CM | POA: Insufficient documentation

## 2011-06-05 DIAGNOSIS — M5126 Other intervertebral disc displacement, lumbar region: Secondary | ICD-10-CM

## 2011-06-05 LAB — URINALYSIS, ROUTINE W REFLEX MICROSCOPIC
Leukocytes, UA: NEGATIVE
Protein, ur: NEGATIVE mg/dL
Specific Gravity, Urine: 1.02 (ref 1.005–1.030)
Urobilinogen, UA: 0.2 mg/dL (ref 0.0–1.0)

## 2011-06-05 LAB — COMPREHENSIVE METABOLIC PANEL
ALT: 17 U/L (ref 0–35)
AST: 19 U/L (ref 0–37)
Calcium: 9.8 mg/dL (ref 8.4–10.5)
GFR calc Af Amer: 84 mL/min — ABNORMAL LOW (ref >90)
Glucose, Bld: 99 mg/dL (ref 70–99)
Sodium: 142 mEq/L (ref 135–145)
Total Protein: 6.9 g/dL (ref 6.0–8.3)

## 2011-06-05 LAB — CBC
Platelets: 257 10*3/uL (ref 150–400)
RBC: 5.1 MIL/uL (ref 3.87–5.11)
WBC: 8 10*3/uL (ref 4.0–10.5)

## 2011-06-05 LAB — PROTIME-INR
INR: 0.95 (ref 0.00–1.49)
Prothrombin Time: 12.9 seconds (ref 11.6–15.2)

## 2011-06-05 LAB — DIFFERENTIAL
Basophils Absolute: 0 10*3/uL (ref 0.0–0.1)
Basophils Relative: 0 % (ref 0–1)
Eosinophils Absolute: 0.1 10*3/uL (ref 0.0–0.7)
Neutrophils Relative %: 63 % (ref 43–77)

## 2011-06-05 LAB — APTT: aPTT: 25 seconds (ref 24–37)

## 2011-06-07 ENCOUNTER — Encounter: Payer: Self-pay | Admitting: Vascular Surgery

## 2011-06-10 ENCOUNTER — Encounter: Payer: Self-pay | Admitting: Vascular Surgery

## 2011-06-11 ENCOUNTER — Ambulatory Visit (INDEPENDENT_AMBULATORY_CARE_PROVIDER_SITE_OTHER): Payer: PRIVATE HEALTH INSURANCE | Admitting: Vascular Surgery

## 2011-06-11 ENCOUNTER — Encounter: Payer: Self-pay | Admitting: Vascular Surgery

## 2011-06-11 VITALS — BP 163/97 | HR 92 | Resp 20 | Ht 63.0 in | Wt 194.5 lb

## 2011-06-11 DIAGNOSIS — IMO0002 Reserved for concepts with insufficient information to code with codable children: Secondary | ICD-10-CM

## 2011-06-11 DIAGNOSIS — I82409 Acute embolism and thrombosis of unspecified deep veins of unspecified lower extremity: Secondary | ICD-10-CM

## 2011-06-11 NOTE — Progress Notes (Signed)
The patient presents today to discuss vena cava filter placement prior to lumbar disc surgery. She is here today with her family. I reviewed her medical records as provided by Dr. Yevette Edwards. She has progressive difficulty in her right leg related to degenerative disc disease. He is scheduled for L4-5 decompression and fusion. She does have a history of DVT. She reports initial incidence of this was in 2010 and was felt to be related to hormone replacement therapy. She was treated with a six-month course of Coumadin. 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 She has stopped her Coumadin therapy in preparation for disc surgery. She does not have any history of pulmonary embolus.  Past Medical History  Diagnosis Date  . Leg pain     right  . Hyperlipidemia   . Hypertension   . DVT (deep venous thrombosis)     History  Substance Use Topics  . Smoking status: Never Smoker   . Smokeless tobacco: Not on file  . Alcohol Use: No    Family History  Problem Relation Age of Onset  . Hypertension Other   . Heart disease Other   . Diabetes Other   . Coronary artery disease Other     Allergies  Allergen Reactions  . Ciprofloxacin     Current outpatient prescriptions:aspirin EC 81 MG tablet, Take 81 mg by mouth daily.  , Disp: , Rfl: ;  Cholecalciferol (VITAMIN D PO), Take 400 Units by mouth daily. , Disp: , Rfl: ;  CITALOPRAM HYDROBROMIDE PO, Take 20 mg by mouth daily. , Disp: , Rfl: ;  Hydrocodone-Acetaminophen (NORCO PO), Take by mouth. 7.5/200mg  tablet; take 2 tabs every 8 hrs prn, Disp: , Rfl: ;  Montelukast Sodium (SINGULAIR PO), Take 10 mg by mouth daily. , Disp: , Rfl:  Olmesartan Medoxomil (BENICAR PO), Take by mouth. 40/12.5 mg tablet; take one tab daily, Disp: , Rfl: ;  Potassium Chloride (KLOR-CON PO), Take 20 mEq by mouth daily. , Disp: , Rfl: ;  SIMVASTATIN PO, Take 40 mg by mouth daily. , Disp: , Rfl: ;  warfarin (COUMADIN)  7.5 MG tablet, Take 7.5 mg by mouth daily.  , Disp: , Rfl:   BP 163/97  Pulse 92  Resp 20  Ht 5\' 3"  (1.6 m)  Wt 194 lb 8 oz (88.225 kg)  BMI 34.45 kg/m2  Body mass index is 34.45 kg/(m^2).       Review of systems: Vascular DVT. GI constipation. Hematologic clotting disorder. Musculoskeletal joint pain. Psychiatric anxiety.  Physical exam well-developed well-nourished black female appearing her stated age in no acute distress. HEENT normal. Carotid arteries without bruits bilaterally. She has 2+ radial and 2+ dorsalis pedis pulses bilaterally. Heart regular rate and rhythm. Chest clear bilaterally. She does not have any significant edema or swelling in both lower extremities.  Impression and plan: 55 year old female with 2 prior episodes of lower cavity DVT. I discussed the risk and benefit of vena cava filter placement. I feel that she is at increased risk for DVT and potential pulmonary embolus while she is off her Coumadin with extensive back surgery. I agree with the indication for vena cava filter placement. I did discuss the potential risk with filter placement and also the lifelong risk of potential caval occlusion was filter. I explained that this most likely would be from a large embolus that would be life-threatening. All questions were answered and she wishes to proceed  with vena cava filter placement tomorrow. If this will be done in the same anesthesia of the back surgery. I explained that I would proceed first with filter placement and then Dr. Yevette Edwards would perform a fusion.

## 2011-06-12 ENCOUNTER — Inpatient Hospital Stay (HOSPITAL_COMMUNITY): Payer: PRIVATE HEALTH INSURANCE

## 2011-06-12 ENCOUNTER — Inpatient Hospital Stay (HOSPITAL_COMMUNITY)
Admission: RE | Admit: 2011-06-12 | Discharge: 2011-06-15 | DRG: 460 | Disposition: A | Discharge status: Rehabilitation Facility | Payer: PRIVATE HEALTH INSURANCE | Source: Ambulatory Visit | Attending: Orthopedic Surgery | Admitting: Orthopedic Surgery

## 2011-06-12 DIAGNOSIS — Z86711 Personal history of pulmonary embolism: Secondary | ICD-10-CM

## 2011-06-12 DIAGNOSIS — R51 Headache: Secondary | ICD-10-CM | POA: Diagnosis not present

## 2011-06-12 DIAGNOSIS — Z86718 Personal history of other venous thrombosis and embolism: Secondary | ICD-10-CM

## 2011-06-12 DIAGNOSIS — Z7982 Long term (current) use of aspirin: Secondary | ICD-10-CM

## 2011-06-12 DIAGNOSIS — Z7901 Long term (current) use of anticoagulants: Secondary | ICD-10-CM

## 2011-06-12 DIAGNOSIS — I10 Essential (primary) hypertension: Secondary | ICD-10-CM | POA: Diagnosis present

## 2011-06-12 DIAGNOSIS — M5126 Other intervertebral disc displacement, lumbar region: Principal | ICD-10-CM | POA: Diagnosis present

## 2011-06-12 DIAGNOSIS — G4733 Obstructive sleep apnea (adult) (pediatric): Secondary | ICD-10-CM | POA: Diagnosis present

## 2011-06-12 DIAGNOSIS — F329 Major depressive disorder, single episode, unspecified: Secondary | ICD-10-CM | POA: Diagnosis present

## 2011-06-12 DIAGNOSIS — I8010 Phlebitis and thrombophlebitis of unspecified femoral vein: Secondary | ICD-10-CM

## 2011-06-12 DIAGNOSIS — F3289 Other specified depressive episodes: Secondary | ICD-10-CM | POA: Diagnosis present

## 2011-06-12 DIAGNOSIS — Z79899 Other long term (current) drug therapy: Secondary | ICD-10-CM

## 2011-06-12 LAB — TYPE AND SCREEN
ABO/RH(D): A POS
Antibody Screen: NEGATIVE

## 2011-06-13 DIAGNOSIS — IMO0002 Reserved for concepts with insufficient information to code with codable children: Secondary | ICD-10-CM

## 2011-06-13 DIAGNOSIS — M48061 Spinal stenosis, lumbar region without neurogenic claudication: Secondary | ICD-10-CM

## 2011-06-14 NOTE — Op Note (Signed)
[name], [name]             ACCOUNT NO.:  [id number]  MEDICAL RECORD NO.:  [id number]  LOCATION:  5008                         FACILITY:  [facility]  PHYSICIAN:  Estill Bamberg, MD      DATE OF BIRTH:  03-07-1956  DATE OF PROCEDURE:  06/12/2011 DATE OF DISCHARGE:                              OPERATIVE REPORT   PREOPERATIVE DIAGNOSES: 1. Severe right leg pain secondary to severe right-sided L5     radiculopathy. 2. Recurrent large right-sided L4-5 disk herniation, status post L4-5     decompression 7 years ago.  POSTOPERATIVE DIAGNOSES: 1. Severe right leg pain secondary to severe right-sided L5     radiculopathy. 2. Recurrent large right-sided L4-5 disk herniation, status post L4-5     decompression 7 years ago.  PROCEDURE: 1. Right-sided transforaminal lumbar interbody fusion, L4-5. 2. Left-sided posterolateral fusion L4-5. 3. Placement of posterior instrumentation L4-L5. 4. Insertion of L4-5 interbody device (8 mm parallel Concorde bulleted     cage, 27 mm in length). 5. Use of local autograft. 6. Intraoperative bone marrow aspiration from a separate incision from     the patient's left iliac crest. 7. Intraoperative use of fluoroscopy.  SURGEON:  Estill Bamberg, MD  ASSISTANT:  Janace Litten, OPA  ANESTHESIA:  General endotracheal anesthesia.  COMPLICATIONS:  None.  DISPOSITION:  Stable.  ESTIMATED BLOOD LOSS:  300 mL.  INDICATIONS FOR PROCEDURE:  Briefly, [name] is an extremely pleasant 55 year old female who presented to my office with severe debilitating pain in her right leg.  I did review an MRI which was notable for a recurrent right-sided L4-5 disk herniation.  Of note, the patient had symptoms for approximately 1 year.  She did go forward with extensive conservative care including epidural injections and physical therapy as well as an anti-inflammatories and pain medications.  Given her unremitting pain, we did have a discussion regarding  going forward with a revision decompression at the L4-5 level with an instrumented transforaminal and posterolateral fusion as noted above.  The patient fully understood the risks and limitations of the procedure as outlined in my preoperative note.  Of note, the patient does have a history of DVTs x2 and the patient was evaluated by Dr. Tawanna Cooler Early prior to her procedure for placement of an inferior vena cava filter.  This was placed uneventfully immediately prior to the patient's procedure by me.  OPERATIVE DETAILS:  On June 12, 2011, the patient was brought to surgery and general endotracheal anesthesia was administered.  The patient was placed prone on a well-padded Jean Rosenthal table with Wilson frame.  The frame was positioned so as to optimize the patient's degree of kyphosis to help optimize the exposure.  I then prepped and draped the back in the usual sterile fashion.  Neurologic monitoring was used throughout the surgery.  SCDs were placed and antibiotics were given.  I then prepped the back in the usual fashion.  Two 18-gauge spinal needles were placed over the midline to help optimize the location of the incision.  A lateral intraoperative radiograph was obtained.  I then made an incision from approximately spinous process of L3 to approximately spinous process of L5.  The fascia was  incised in the midline.  The lamina of L4 and L5 was identified.  I did bring in lateral fluoroscopy to help confirm the appropriate level.  I then subperiosteally exposed the lamina of L4 and L5 and the transverse processes of L4 and L5.  Of particular note, there was noted to be an extensive amount of scar overlying the L4-5 interspace on the right side.  I did meticulously used a significant amount of care to ensure that this area was handled meticulously and delicately.  I then turned my attention towards the patient's posterolateral gutters.  I did use a 4 mm bur to gain access to the L4  pedicle on the right side.  A curved Lenke gearshift probe was used to cannulate the pedicle.  A ball-tip probe was then used to confirm that there is no cortical violation and a 6 mm tap was utilized.  Again, a ball-tipped probe was utilized to confirm no cortical violation and the pedicle hole was sealed using bone wax.  I then cannulated the L5 pedicle on the right side and then the L4 and L5 pedicles on the left side in the manner described previously.  I did place pedicle markers on the right side which did help with the trajectory of the screws on the left using lateral and AP fluoroscopy. All the pedicle holes were sealed using bone wax.  I then turned my attention towards the decompression.  Of note, this is a very meticulous and part of the procedure and did take an extensive period of time, given the extensive scar tissue noted.  I was ultimately able to remove the right-sided lamina of L4 in addition to the facet joint at the L4-5 level.  Of particular note, the L4-5 intervertebral disk herniation was readily noted and was noted to be extensively adherent to the dura.  Of note, the patient did have symptoms for about a year and it was clear that this was a chronic appearing disk herniation which is very intimately adherent to the dural sac.  I did spend approximately 60 minutes carefully teasing away the intervertebral disk from the overlying dura.  Of note, again, this was a very meticulous part of the procedure and did take approximately 60 minutes, where as normally this takes approximately 5 minutes.  I was, however, able to develop a safe plane between the intervertebral disk herniation and the traversing L5 nerve.  With an assistant holding medial retraction of the exiting L5 nerve, I did use a #15 blade knife to perform an annulotomy.  I then used a 7 and an 8 mm scraper to prepare the endplates.  I then used series of curettes to prepare the endplates of L4 and L5.  At  this point of the procedure, I did confirm that there was no compression on the traversing L5 nerve.  I then placed a series of trials and did feel that an 8 mm interbody trial would be the most appropriate fit.  This was checked under lateral fluoroscopy.  I then removed the retractors and copiously irrigated the wound.  I then turned my attention towards the patient's left iliac crest.  A stab incision was made overlying the iliac crest on the left side.  I did use a Jamshidi to aspirate approximately 8 mL of bone marrow aspirate from the patient's left iliac crest and this was mixed with 10 mL of Vitoss BA.  Autograft obtained from removal of the facet joint and lamina was mixed with the  Vitoss BA. This mixture was packed into the interbody space.  I then packed the interbody device with the Vitoss/bone marrow aspirate/autograft mixture and interbody device was tamped into position in the usual fashion.  I did note an excellent press fit.  AP and lateral fluoroscopy did confirm appropriate positioning of the interbody device and an excellent press fit was perceived.  I then placed 7 x 45 mm screws at L4 and L5 on the right side.  I did use triggered EMG to test the screws and the L4 and L5 screws each tested at 28 milliamps.  At this point in time, the Wilson frame was repositioned so as to optimize the degree of the patient's lordosis.  A 35 mm rod was then placed and caps were placed. Compression was placed across the rod and a final locking procedure was performed at L4 and L5.  I then turned my attention towards the patient's left side.  At this point, the retractors were again removed and the wound was copiously irrigated using approximately 1 L of normal saline.  I then used a 4 mm high-speed bur to thoroughly decorticate the lamina of L4 and L5 in addition to the L4-5 facet joint and the transverse processes of L4 and L5.  The remainder of the bone graft mixture was placed into  the posterolateral gutter and across the posterior elements.  A 7 x 45 mm screws were placed at L4 and L5. Triggered EMG was then used to test the screws and the L4 and L5 screws tested at 29 and 30 milliamps respectively.  A 35 mm rod was placed and caps were placed followed by a final locking procedure.  I then turned my attention towards the epidural space to ensure that all epidural bleeding was meticulously controlled.  I then performed a Valsalva maneuver up to 30 mm of water and there was no extravasation of cerebrospinal fluid noted.  The fascia was then closed using #1 Vicryl. The subcutaneous layer was closed using 2-0 Vicryl and the skin was closed using 4-0 Monocryl.  Benzoin and Steri-Strips were applied.  All instrument counts were correct at the termination of the procedure.  Of note, Janace Litten was my assistant throughout the procedure and aided in essential retraction and suctioning and placement of the hardware needed throughout the surgery.     Estill Bamberg, MD     MD/MEDQ  D:  06/12/2011  T:  06/12/2011  Job:  [id number]  Electronically Signed by Estill Bamberg  on 06/14/2011 05:33:56 PM

## 2011-06-15 ENCOUNTER — Inpatient Hospital Stay (HOSPITAL_COMMUNITY)
Admission: RE | Admit: 2011-06-15 | Discharge: 2011-06-22 | DRG: 946 | Disposition: A | Discharge status: Home Health | Payer: PRIVATE HEALTH INSURANCE | Source: Ambulatory Visit | Attending: Physical Medicine & Rehabilitation | Admitting: Physical Medicine & Rehabilitation

## 2011-06-15 DIAGNOSIS — Z5189 Encounter for other specified aftercare: Principal | ICD-10-CM

## 2011-06-15 DIAGNOSIS — M5126 Other intervertebral disc displacement, lumbar region: Secondary | ICD-10-CM

## 2011-06-15 DIAGNOSIS — G4733 Obstructive sleep apnea (adult) (pediatric): Secondary | ICD-10-CM

## 2011-06-15 DIAGNOSIS — Z79899 Other long term (current) drug therapy: Secondary | ICD-10-CM

## 2011-06-15 DIAGNOSIS — Z86718 Personal history of other venous thrombosis and embolism: Secondary | ICD-10-CM

## 2011-06-15 DIAGNOSIS — I10 Essential (primary) hypertension: Secondary | ICD-10-CM

## 2011-06-15 DIAGNOSIS — F329 Major depressive disorder, single episode, unspecified: Secondary | ICD-10-CM

## 2011-06-15 DIAGNOSIS — Z86711 Personal history of pulmonary embolism: Secondary | ICD-10-CM

## 2011-06-15 DIAGNOSIS — F3289 Other specified depressive episodes: Secondary | ICD-10-CM

## 2011-06-15 DIAGNOSIS — Z981 Arthrodesis status: Secondary | ICD-10-CM

## 2011-06-15 DIAGNOSIS — E785 Hyperlipidemia, unspecified: Secondary | ICD-10-CM

## 2011-06-15 DIAGNOSIS — IMO0002 Reserved for concepts with insufficient information to code with codable children: Secondary | ICD-10-CM

## 2011-06-15 DIAGNOSIS — M48061 Spinal stenosis, lumbar region without neurogenic claudication: Secondary | ICD-10-CM

## 2011-06-16 DIAGNOSIS — IMO0002 Reserved for concepts with insufficient information to code with codable children: Secondary | ICD-10-CM

## 2011-06-16 DIAGNOSIS — M48061 Spinal stenosis, lumbar region without neurogenic claudication: Secondary | ICD-10-CM

## 2011-06-17 DIAGNOSIS — M48061 Spinal stenosis, lumbar region without neurogenic claudication: Secondary | ICD-10-CM

## 2011-06-17 DIAGNOSIS — IMO0002 Reserved for concepts with insufficient information to code with codable children: Secondary | ICD-10-CM

## 2011-06-17 LAB — COMPREHENSIVE METABOLIC PANEL
AST: 21 U/L (ref 0–37)
Albumin: 3.1 g/dL — ABNORMAL LOW (ref 3.5–5.2)
Alkaline Phosphatase: 73 U/L (ref 39–117)
Chloride: 98 mEq/L (ref 96–112)
Potassium: 3.6 mEq/L (ref 3.5–5.1)
Total Bilirubin: 0.4 mg/dL (ref 0.3–1.2)
Total Protein: 6.5 g/dL (ref 6.0–8.3)

## 2011-06-17 LAB — CBC
Platelets: 247 10*3/uL (ref 150–400)
RDW: 14.1 % (ref 11.5–15.5)
WBC: 8.3 10*3/uL (ref 4.0–10.5)

## 2011-06-17 LAB — DIFFERENTIAL
Basophils Absolute: 0 10*3/uL (ref 0.0–0.1)
Basophils Relative: 0 % (ref 0–1)
Eosinophils Absolute: 0.2 10*3/uL (ref 0.0–0.7)
Eosinophils Relative: 2 % (ref 0–5)

## 2011-06-18 DIAGNOSIS — M48061 Spinal stenosis, lumbar region without neurogenic claudication: Secondary | ICD-10-CM

## 2011-06-18 DIAGNOSIS — IMO0002 Reserved for concepts with insufficient information to code with codable children: Secondary | ICD-10-CM

## 2011-06-19 NOTE — Op Note (Signed)
  [name], [name]             ACCOUNT NO.:  [id number]  MEDICAL RECORD NO.:  [id number]  LOCATION:  2899                         FACILITY:  [facility]  PHYSICIAN:  Larina Earthly, M.D.    DATE OF BIRTH:  [birth year]  DATE OF PROCEDURE:  06/12/2011 DATE OF DISCHARGE:                              OPERATIVE REPORT   PREOPERATIVE DIAGNOSIS:  Severe lumbar disk disease with history of recurrent deep venous thrombosis.  POSTOPERATIVE DIAGNOSIS:  Severe lumbar disk disease with history of recurrent deep venous thrombosis.  PROCEDURE:  ECLIPSE vena cava filter placement.  SURGEON:  Larina Earthly, MD  ASSISTANT:  Nurse.  ANESTHESIA:  General endotracheal.  COMPLICATIONS:  None.  DISPOSITION:  Lumbar surgery per Dr. Yevette Edwards.  INDICATION FOR THE PROCEDURE:  The patient is a 55 year old female with 2 episodes of documented DVT.  She is told that she needs to be on lifelong Coumadin therapy.  She has severe degenerative disk disease. It was recommended that she undergo a spine fusion.  I was consulted for vena cava filter placement to reduce her risk for pulmonary embolus around the time of inability to have adequate anticoagulation.  I saw the patient preoperatively and discussed risks and benefits and she wished to proceed with the procedure.  PROCEDURE IN DETAIL:  The patient was taken to the operating room, placed in a supine position where the area of both groins were prepped and draped in usual sterile fashion.  Using ultrasound visualization, the right common femoral vein was accessed with an 18-gauge needle and a guidewire was passed up to the level of the vena cava and this was confirmed with fluoroscopy.  The long ECLIPSE filter sheath was positioned up to the level of L2-L3.  Hand injection of contrast revealed the location of the left renal vein, which was the lowest renal vein.  The.  The dilator was removed and the femoral head position ECLIPSE cava filter was  positioned through the long sheath and was deployed at the lower edge of L2.  There was good apposition.  The sheath was removed and pressure was held for hemostasis.  The completion of the procedure will be dictated as a separate operative note by Dr. Estill Bamberg.     Larina Earthly, M.D.     TFE/MEDQ  D:  06/12/2011  T:  06/12/2011  Job:  [id number]  cc:   Estill Bamberg, MD  Electronically Signed by Doylene Splinter M.D. on 06/19/2011 01:17:49 PM

## 2011-06-21 DIAGNOSIS — IMO0002 Reserved for concepts with insufficient information to code with codable children: Secondary | ICD-10-CM

## 2011-06-21 DIAGNOSIS — M48061 Spinal stenosis, lumbar region without neurogenic claudication: Secondary | ICD-10-CM

## 2011-06-21 MED ORDER — BISACODYL 10 MG RE SUPP: 10.0000 mg | Freq: Every evening | RECTAL | Status: DC | PRN

## 2011-06-21 MED ORDER — OXYCODONE HCL 20 MG PO TB12: 20.0000 mg | ORAL_TABLET | Freq: Two times a day (BID) | ORAL | Status: DC

## 2011-06-21 MED ORDER — OLMESARTAN MEDOXOMIL 40 MG PO TABS
40.0000 mg | ORAL_TABLET | Freq: Every day | ORAL | Status: DC
  Filled 2011-06-21 (×3): qty 1

## 2011-06-21 MED ORDER — METHOCARBAMOL 500 MG PO TABS: 500.0000 mg | ORAL_TABLET | Freq: Four times a day (QID) | ORAL | Status: DC | PRN

## 2011-06-21 MED ORDER — POTASSIUM CHLORIDE CRYS ER 20 MEQ PO TBCR
20.0000 meq | EXTENDED_RELEASE_TABLET | Freq: Every day | ORAL | Status: DC
  Filled 2011-06-21 (×4): qty 1

## 2011-06-21 MED ORDER — CITALOPRAM HYDROBROMIDE 20 MG PO TABS
20.0000 mg | ORAL_TABLET | Freq: Every day | ORAL | Status: DC
  Filled 2011-06-21 (×4): qty 1

## 2011-06-21 MED ORDER — PROMETHAZINE HCL 25 MG/ML IJ SOLN: 12.5000 mg | Freq: Four times a day (QID) | INTRAMUSCULAR | Status: DC | PRN

## 2011-06-21 MED ORDER — TRAZODONE HCL 50 MG PO TABS: 25.0000 mg | ORAL_TABLET | Freq: Every evening | ORAL | Status: DC | PRN

## 2011-06-21 MED ORDER — ACETAMINOPHEN 325 MG PO TABS: 325.0000 mg | ORAL_TABLET | ORAL | Status: DC | PRN

## 2011-06-21 MED ORDER — SENNOSIDES-DOCUSATE SODIUM 8.6-50 MG PO TABS: 2.0000 | ORAL_TABLET | Freq: Every day | ORAL | Status: DC

## 2011-06-21 MED ORDER — SIMVASTATIN 20 MG PO TABS
20.0000 mg | ORAL_TABLET | Freq: Every day | ORAL | Status: DC
  Filled 2011-06-21 (×3): qty 1

## 2011-06-21 MED ORDER — OXYCODONE HCL 5 MG PO TABS: 5.0000 mg | ORAL_TABLET | ORAL | Status: DC | PRN

## 2011-06-21 MED ORDER — METHOCARBAMOL 500 MG PO TABS: 500.0000 mg | ORAL_TABLET | Freq: Four times a day (QID) | ORAL | Status: DC

## 2011-06-21 MED ORDER — PROMETHAZINE HCL 12.5 MG PO TABS: 12.5000 mg | ORAL_TABLET | Freq: Four times a day (QID) | ORAL | Status: DC | PRN

## 2011-06-21 MED ORDER — SORBITOL 70 % SOLN: 30.0000 mL | Freq: Two times a day (BID) | Status: DC | PRN

## 2011-06-21 MED ORDER — MONTELUKAST SODIUM 10 MG PO TABS
10.0000 mg | ORAL_TABLET | Freq: Every day | ORAL | Status: DC
  Filled 2011-06-21 (×3): qty 1

## 2011-06-21 MED ORDER — HYDROCHLOROTHIAZIDE 12.5 MG PO CAPS
12.5000 mg | ORAL_CAPSULE | Freq: Every day | ORAL | Status: DC
  Filled 2011-06-21 (×3): qty 1

## 2011-06-21 MED ORDER — VITAMIN D3 25 MCG (1000 UNIT) PO TABS
1000.0000 [IU] | ORAL_TABLET | Freq: Every day | ORAL | Status: DC
  Filled 2011-06-21 (×4): qty 1

## 2011-06-21 MED ORDER — PROMETHAZINE HCL 12.5 MG RE SUPP: 12.5000 mg | Freq: Four times a day (QID) | RECTAL | Status: DC | PRN

## 2011-06-21 NOTE — Discharge Summary (Signed)
  [name], [name]             ACCOUNT NO.:  [id number]  MEDICAL RECORD NO.:  [id number]  LOCATION:                                 FACILITY:  PHYSICIAN:  Estill Bamberg, MD      DATE OF BIRTH:  [birth year]  DATE OF ADMISSION:  06/12/2011 DATE OF DISCHARGE:  06/15/2011                              DISCHARGE SUMMARY   ADMITTING PHYSICIAN:  Estill Bamberg, MD  ADMISSION HISTORY:  Briefly, Ms. Hearn is an extremely pleasant 55- year-old female who presented to my office with severe and debilitating pain in her right leg.  I did review an MRI ,which was notable for recurrent right-sided L4-5 disk herniation.  Of note, the patient is status post a decompression 1 year ago.  She did fail conservative care and was brought to surgery on June 12, 2011 for a decompression and fusion procedure at the L4-5 level.  HOSPITAL COURSE:  On June 12, 2011, the patient was brought to surgery and underwent the procedure as noted above.  The patient tolerated the procedure well and was transferred to recovery in stable condition.  The patient's postoperative course was unremarkable.  The patient did have a Foley catheter, which was removed on postoperative day #2.  She was progressively mobilized throughout her hospital stay with the Physical Therapy Team.  The patient does live alone and it was the patient's request that the rehab team be consulted.  The rehab team was contacted on postoperative day #1 and did agree to accept the patient for continued rehabilitation on postoperative day #3.  The patient was neurovascularly intact throughout her hospital stay.  She did have mild headaches throughout her hospital stay, but these headaches were not position and they did improve over the course of her stay.  She was neurovascularly intact throughout her stay.  Her dressing was noted to be clean, dry, and intact throughout.  DISCHARGE INSTRUCTIONS:  The patient will continue to take  OxyContin as well as Percocet for her pain.  She will continue to take Valium for muscle spasms.  She will continue to adhere to back precautions at all times.  She will follow up with me in approximately 1-1/2 weeks after her procedure for an evaluation of her wound and a discussion regarding her rehabilitation.     Estill Bamberg, MD     MD/MEDQ  D:  06/15/2011  T:  06/15/2011  Job:  [id number]  Electronically Signed by Estill Bamberg  on 06/21/2011 10:59:36 AM

## 2011-06-23 NOTE — Discharge Summary (Signed)
[name], [name]             ACCOUNT NO.:  [id number]  MEDICAL RECORD NO.:  [id number]  LOCATION:  4007                         FACILITY:  [facility]  PHYSICIAN:  Ranelle Oyster, M.D.DATE OF BIRTH:  [birth year]  DATE OF ADMISSION:  06/15/2011 DATE OF DISCHARGE:  06/22/2011                              DISCHARGE SUMMARY   DISCHARGE DIAGNOSES: 1. Lumbar L4-5 transforaminal lumbar interbody fusion, June 12, 2011. 2. History of pulmonary emboli and deep vein thrombosis with inferior     vena cava filter. 3. Hypertension. 4. Obstructive sleep apnea. 5. Depression. 6. Hyperlipidemia. 7. Pain management.  HISTORY:  This is a 55 year old female with bilateral pulmonary emboli, deep vein thrombosis in 2010, admitted on October 24 with low back pain, radiating to lower extremities and noted history of lumbar L4-5 laminotomy in 2005.  X-rays and imaging showed recurrent lumbar L4-5 disk herniation with radiculopathy.  She had been on Coumadin for history of pulmonary emboli until early October, discontinued and planned back surgery, underwent IVC filter placement per Dr. Arbie Cookey, October 24 followed by lumbar L4-5 TLIF, October 24 per Dr. Estill Bamberg.  Postoperative pain control.  PCA discontinued, October 26. Foley tube removed, October 25.  He would be at the discretion of Dr. Yevette Edwards when to resume Coumadin therapy.  She was moderate assist for bed mobility, total assist for ambulation.  She was admitted for comprehensive rehab program.  PAST MEDICAL HISTORY:  See discharge diagnoses.  No alcohol or tobacco.  ALLERGIES:  CIPRO.  SOCIAL HISTORY:  She is married, unemployed.  Husband works, but daughter plans to assist as needed.  One-level home, 3 steps to entry.  FUNCTIONAL HISTORY PRIOR TO ADMISSION:  Independent with a cane.  She does not drive.  FUNCTIONAL STATUS UPON ADMISSION:  To rehab services was moderate assist bed mobility and transfers, total assist to  ambulate 3 steps, moderate to max assist lower body activities of daily living.  MEDICATIONS PRIOR TO ADMISSION: 1. Benicar with hydrochlorothiazide 40/12.5 daily. 2. Coumadin 7.5 daily, discontinued October 12. 3. Celexa 10 mg daily. 4. Zocor 40 mg at bedtime. 5. Singulair at bedtime. 6. Vicodin as needed. 7. Aspirin 81 mg daily. 8. Klor-Con 20 mEq daily. 9. Vitamin D daily.  PHYSICAL EXAMINATION:  VITAL SIGNS:  Blood pressure 143/89, pulse 89, temperature 98.6, respirations 18. GENERAL:  This was an alert female, in no acute distress, oriented x3. Deep tendon reflexes 2+.  Steri-Strips in place. LUNGS:  Clear to auscultation. CARDIAC:  Regular rate and rhythm. ABDOMEN:  Soft, nontender.  Good bowel sounds.  HOSPITAL COURSE:  The patient was admitted to inpatient rehab services with therapies initiated on a 3-hour daily basis consisting of physical therapy, occupational therapy, and rehabilitation nursing.  The following issues were addressed during the patient's rehabilitation stay.  Pertaining to Ms. Montecalvo's lumbar L4-5 TLIF October 24, surgical site healing nicely.  Steri-Strips in place.  No signs of infection.  She remained afebrile.  An IVC filter was in place for history of pulmonary emboli, deep vein thrombosis.  She had been on Coumadin in the past.  This was held for upcoming surgery.  He would be at the discretion  of Dr. Estill Bamberg on when this Coumadin can be resumed.  Pain management ongoing with the use of Robaxin, OxyContin sustained release and intermediate release.  Her OxyContin sustained release was increased to 20 mg every 12 hours on October 31 with good results.  Her blood pressure is well controlled with hydrochlorothiazide and Benicar with no orthostatic changes.  She did have a history of depression, maintained on Celexa with emotional support provided.  The patient received weekly collaborative interdisciplinary team conferences to discuss  estimated length of stay, family teaching, and any barriers to discharge.  She was overall minimal assist to supervision for transfers and ambulation, close supervision for activities of daily living, minimum to moderate assist for stairs.  She was continent of bowel and bladder.  Overall, strength and endurance have greatly improved.  She was encouraged to overall progress and ultimate plan is to be discharged to home.  LATEST LABS:  Showed hemoglobin 11.2, hematocrit 34, platelet 247,000. Sodium 138, potassium 3.6, BUN 5, creatinine 0.7.  DISCHARGE MEDICATIONS AT TIME OF DICTATION: 1. Celexa 20 mg daily. 2. Senokot tablets 2 at bedtime. 3. Vitamin D 1000 units daily. 4. Singulair 10 mg at bedtime. 5. Benicar 40 mg daily. 6. Potassium chloride 20 mEq daily. 7. Zocor 20 mg daily. 8. Hydrochlorothiazide 12.5 mg at bedtime. 9. Robaxin 500 mg every 6 hours as needed for muscle spasms, dispense     of 90 tablets. 10.OxyContin sustained release 20 mg every 12 hours x1 week, then 10     mg every 12 hours x1 week and stop. 11.Oxycodone immediate release 5 mg 1 or 2 tablets every 4 hours as     needed pain, dispense of 90 tablets.  DIET:  Regular.  SPECIAL INSTRUCTIONS:  Routine back precautions.  Follow up with Dr. Estill Bamberg in relation to when Coumadin to be resumed as the patient currently had an IVC filter in place.  Follow up with Dr. Faith Rogue at the outpatient rehab service office as needed.  Dr. [name], medical management.  Home health therapies had been arranged as per [employer].     Mariam Dollar, P.A.   ______________________________ Ranelle Oyster, M.D.    DA/MEDQ  D:  06/21/2011  T:  06/21/2011  Job:  [id number]  cc:   [name], M.D. Estill Bamberg, MD Larina Earthly, M.D.  Electronically Signed by Mariam Dollar P.A. on 06/21/2011 03:19:13 PM Electronically Signed by Faith Rogue M.D. on 06/23/2011 07:58:53 PM

## 2011-06-27 ENCOUNTER — Encounter: Payer: Self-pay | Admitting: Vascular Surgery

## 2011-06-27 DIAGNOSIS — I82409 Acute embolism and thrombosis of unspecified deep veins of unspecified lower extremity: Secondary | ICD-10-CM | POA: Insufficient documentation

## 2011-11-21 ENCOUNTER — Emergency Department (HOSPITAL_COMMUNITY): Payer: PRIVATE HEALTH INSURANCE

## 2011-11-21 ENCOUNTER — Emergency Department (HOSPITAL_COMMUNITY)
Admission: EM | Admit: 2011-11-21 | Discharge: 2011-11-22 | Disposition: A | Discharge status: Home / Self Care | Payer: PRIVATE HEALTH INSURANCE | Attending: Emergency Medicine | Admitting: Emergency Medicine

## 2011-11-21 ENCOUNTER — Other Ambulatory Visit: Payer: Self-pay

## 2011-11-21 ENCOUNTER — Encounter (HOSPITAL_COMMUNITY): Payer: Self-pay | Admitting: Emergency Medicine

## 2011-11-21 DIAGNOSIS — E785 Hyperlipidemia, unspecified: Secondary | ICD-10-CM | POA: Insufficient documentation

## 2011-11-21 DIAGNOSIS — R0789 Other chest pain: Secondary | ICD-10-CM

## 2011-11-21 DIAGNOSIS — I10 Essential (primary) hypertension: Secondary | ICD-10-CM | POA: Insufficient documentation

## 2011-11-21 DIAGNOSIS — R071 Chest pain on breathing: Secondary | ICD-10-CM | POA: Insufficient documentation

## 2011-11-21 DIAGNOSIS — Z86718 Personal history of other venous thrombosis and embolism: Secondary | ICD-10-CM | POA: Insufficient documentation

## 2011-11-21 DIAGNOSIS — Z86711 Personal history of pulmonary embolism: Secondary | ICD-10-CM | POA: Insufficient documentation

## 2011-11-21 HISTORY — DX: Other pulmonary embolism without acute cor pulmonale: I26.99

## 2011-11-21 LAB — CBC
HCT: 41.3 % (ref 36.0–46.0)
MCH: 26.7 pg (ref 26.0–34.0)
MCV: 80.4 fL (ref 78.0–100.0)
Platelets: 256 10*3/uL (ref 150–400)
RBC: 5.14 MIL/uL — ABNORMAL HIGH (ref 3.87–5.11)
RDW: 15 % (ref 11.5–15.5)

## 2011-11-21 LAB — BASIC METABOLIC PANEL
Calcium: 9.4 mg/dL (ref 8.4–10.5)
Creatinine, Ser: 0.78 mg/dL (ref 0.50–1.10)
GFR calc non Af Amer: >90 mL/min (ref >90)
Glucose, Bld: 117 mg/dL — ABNORMAL HIGH (ref 70–99)
Sodium: 140 mEq/L (ref 135–145)

## 2011-11-21 LAB — DIFFERENTIAL
Eosinophils Absolute: 0.2 10*3/uL (ref 0.0–0.7)
Eosinophils Relative: 2 % (ref 0–5)
Lymphs Abs: 3.1 10*3/uL (ref 0.7–4.0)
Monocytes Absolute: 0.6 10*3/uL (ref 0.1–1.0)

## 2011-11-21 LAB — POCT I-STAT TROPONIN I

## 2011-11-21 MED ORDER — ONDANSETRON HCL 4 MG/2ML IJ SOLN
4.0000 mg | Freq: Once | INTRAMUSCULAR | Status: AC
  Administered 2011-11-21: 4 mg via INTRAVENOUS
  Filled 2011-11-21: qty 2

## 2011-11-21 MED ORDER — FENTANYL CITRATE 0.05 MG/ML IJ SOLN
100.0000 ug | Freq: Once | INTRAMUSCULAR | Status: AC
  Administered 2011-11-21: 100 ug via INTRAVENOUS
  Filled 2011-11-21: qty 2

## 2011-11-21 NOTE — ED Notes (Signed)
Pt stated that she is having CP in her left side. The pain started yesterday and has become increasingly worse. No n/v/sob. No radiation. Pt stated that it does not increase with inspiration. Pt stated this pain feels exactly like the pain when she had blood clots. Lung sounds diminished. Will continue to monitor.

## 2011-11-21 NOTE — ED Notes (Signed)
PT. REPORTS SUBSTERNAL CHEST PAIN FOR 2 DAYS WORSE THIS EVENING WITH SOB AND NAUSEA.

## 2011-11-22 ENCOUNTER — Encounter (HOSPITAL_COMMUNITY): Payer: Self-pay | Admitting: Radiology

## 2011-11-22 ENCOUNTER — Emergency Department (HOSPITAL_COMMUNITY): Payer: PRIVATE HEALTH INSURANCE

## 2011-11-22 MED ORDER — IOHEXOL 350 MG/ML SOLN
100.0000 mL | Freq: Once | INTRAVENOUS | Status: AC | PRN
  Administered 2011-11-22: 100 mL via INTRAVENOUS

## 2011-11-22 MED ORDER — HYDROCODONE-ACETAMINOPHEN 5-325 MG PO TABS: 2.0000 | ORAL_TABLET | ORAL | Status: AC | PRN

## 2011-11-22 NOTE — ED Provider Notes (Cosign Needed)
History     CSN: [id number]  Arrival date & time 11/21/11  2145   First MD Initiated Contact with Patient 11/21/11 2300      Chief Complaint  Patient presents with  . Chest Pain    (Consider location/radiation/quality/duration/timing/severity/associated sxs/prior treatment) HPI 56 year old female presents to the emergency department with complaint of chest pain. Patient reports pain for an ongoing for the last 2 days. Pain has been constant. Pain feels like her prior PE in May of last year. Patient has had some shortness of breath and nausea. Patient denies missing any doses of her blood thinner. Patient denies any new activities. Patient reports pain is worse with movement and palpation of her chest. She has had no fever or cough. She denies any leg swelling. Patient denies previous history of coronary disease. Past Medical History  Diagnosis Date  . Leg pain     right  . Hyperlipidemia   . Hypertension   . DVT (deep venous thrombosis)   . PE (pulmonary embolism)     Past Surgical History  Procedure Date  . Back surgery   . Abdominal hysterectomy   . Cholecystectomy     Gall Bladder removal  . Appendectomy   . Spine surgery     decompression surgery  . Laminotomy     right at L4-5 with a disc bulge and superimposed right lateral recess protrusion encroaching on the L5 root    Family History  Problem Relation Age of Onset  . Hypertension Other   . Heart disease Other   . Diabetes Other   . Coronary artery disease Other     History  Substance Use Topics  . Smoking status: Never Smoker   . Smokeless tobacco: Not on file  . Alcohol Use: No    OB History    Grav Para Term Preterm Abortions TAB SAB Ect Mult Living                  Review of Systems  All other systems reviewed and are negative.   other than listed in history of present illness  Allergies  Ciprofloxacin  Home Medications   Current Outpatient Rx  Name Route Sig Dispense Refill  .  CITALOPRAM HYDROBROMIDE 20 MG PO TABS Oral Take 20 mg by mouth daily.    . CYCLOBENZAPRINE HCL 5 MG PO TABS Oral Take 5 mg by mouth 2 (two) times daily with a meal.    . HYDROCODONE-ACETAMINOPHEN 5-325 MG PO TABS Oral Take 1 tablet by mouth every 4 (four) hours as needed. For pain     . MONTELUKAST SODIUM 10 MG PO TABS Oral Take 10 mg by mouth at bedtime.    Marland Kitchen OLMESARTAN MEDOXOMIL-HCTZ 40-12.5 MG PO TABS Oral Take 1 tablet by mouth daily.    Marland Kitchen KLOR-CON PO Oral Take 20 mEq by mouth daily.     Marland Kitchen RIVAROXABAN 10 MG PO TABS Oral Take 20 mg by mouth daily.     Marland Kitchen SIMVASTATIN 40 MG PO TABS Oral Take 40 mg by mouth every evening.      BP 135/87  Pulse 84  Temp(Src) 98.6 F (37 C) (Oral)  Resp 9  SpO2 94%  Physical Exam  Nursing note and vitals reviewed. Constitutional: She is oriented to person, place, and time. She appears well-developed and well-nourished.       Uncomfortable appearing  HENT:  Head: Normocephalic and atraumatic.  Right Ear: External ear normal.  Left Ear: External ear normal.  Nose:  Nose normal.  Mouth/Throat: Oropharynx is clear and moist.  Eyes: Conjunctivae and EOM are normal. Pupils are equal, round, and reactive to light.  Neck: Normal range of motion. Neck supple. No JVD present. No tracheal deviation present. No thyromegaly present.  Cardiovascular: Normal rate, regular rhythm, normal heart sounds and intact distal pulses.  Exam reveals no gallop and no friction rub.   No murmur heard. Pulmonary/Chest: Effort normal and breath sounds normal. No stridor. No respiratory distress. She has no wheezes. She has no rales. She exhibits tenderness (tenderness to palpation over sternum. Patient reports palpation of chest causes pain that brought her to the emergency department).  Abdominal: Soft. Bowel sounds are normal. She exhibits no distension and no mass. There is no tenderness. There is no rebound and no guarding.  Musculoskeletal: Normal range of motion. She exhibits no  edema and no tenderness.  Lymphadenopathy:    She has no cervical adenopathy.  Neurological: She is oriented to person, place, and time. She exhibits normal muscle tone. Coordination normal.  Skin: Skin is dry. No rash noted. No erythema. No pallor.  Psychiatric: She has a normal mood and affect. Her behavior is normal. Judgment and thought content normal.    ED Course  Procedures (including critical care time)  Labs Reviewed  CBC - Abnormal; Notable for the following:    RBC 5.14 (*)    All other components within normal limits  BASIC METABOLIC PANEL - Abnormal; Notable for the following:    Glucose, Bld 117 (*)    All other components within normal limits  DIFFERENTIAL  POCT I-STAT TROPONIN I   Dg Chest 2 View  11/21/2011  *RADIOLOGY REPORT*  Clinical Data: Chest pain, shortness of breath and cough  CHEST - 2 VIEW  Comparison: 06/12/2011  Findings: Normal heart size and pulmonary vascularity.  Lungs appear clear expanded.  No focal airspace consolidation.  No blunting of costophrenic angles.  No pneumothorax.  Mild degenerative changes in the spine.  Surgical clips in the right upper quadrant.  Note that the central venous catheter has been removed since the previous study.  IMPRESSION: No evidence of active pulmonary disease.  Original Report Authenticated By: Marlon Pel, M.D.   Ct Angio Chest W/cm &/or Wo Cm  11/22/2011  *RADIOLOGY REPORT*  Clinical Data: Chest pain.  History of prior pulmonary embolism.  CT ANGIOGRAPHY CHEST  Technique:  Multidetector CT imaging of the chest using the standard protocol during bolus administration of intravenous contrast. Multiplanar reconstructed images including MIPs were obtained and reviewed to evaluate the vascular anatomy.  Contrast:  100 ml Omnipaque-300  Comparison: Chest CT 01/15/2011.  Findings: The chest wall is unremarkable.  No breast masses, supraclavicular or axillary lymphadenopathy.  Small scattered lymph nodes are noted.  The  thyroid gland appears normal.  The bony thorax is intact.  The heart is within normal limits in size.  No pericardial effusion.  No mediastinal or hilar lymphadenopathy.  The aorta is normal in caliber.  No dissection.  The esophagus is grossly normal.  The pulmonary arterial tree is fairly well opacified.  No definite filling defects to suggest pulmonary emboli.  Examination of the lung parenchyma demonstrates dependent atelectasis bilaterally.  No infiltrates, edema or effusions.  No worrisome pulmonary mass or nodules.  The upper abdomen is unremarkable.  IMPRESSION:  1.  No CT findings for pulmonary embolism. 2.  Normal thoracic aorta. 3.  No acute pulmonary findings.  Dependent atelectasis is noted.  Original Report Authenticated  By: P. Loralie Champagne, M.D.    Date: 11/22/2011  Rate: 90  Rhythm: normal sinus rhythm  QRS Axis: normal  Intervals: normal  ST/T Wave abnormalities: nonspecific T wave changes  Conduction Disutrbances:none  Narrative Interpretation:   Old EKG Reviewed: unchanged    No diagnosis found.    MDM  56 year old female with history of DVT, PE hypertension hyperlipidemia with onset of 2 days of chest pain. Patient relates the pain is similar to her prior PE. EKG unremarkable enzymes negative. Patient has had a CT angiogram chest to rule out PE and none seen. Pain is reproducible with palpation, and patient better after pain medications. Suspect chest wall as source of pain. We'll discharge her to follow up with Dr. bland        Olivia Mackie, MD 11/22/11 [phone]

## 2011-11-22 NOTE — Discharge Instructions (Signed)
Return to the ER for worsening pain, shortness of breath, or other concerning symptoms.  Follow up with Dr Parke Simmers for recheck in 2-3 days.  Chest Pain (Nonspecific) It is often hard to give a specific diagnosis for the cause of chest pain. There is always a chance that your pain could be related to something serious, such as a heart attack or a blood clot in the lungs. You need to follow up with your caregiver for further evaluation. CAUSES   Heartburn.   Pneumonia or bronchitis.   Anxiety or stress.   Inflammation around your heart (pericarditis) or lung (pleuritis or pleurisy).   A blood clot in the lung.   A collapsed lung (pneumothorax). It can develop suddenly on its own (spontaneous pneumothorax) or from injury (trauma) to the chest.   Shingles infection (herpes zoster virus).  The chest wall is composed of bones, muscles, and cartilage. Any of these can be the source of the pain.  The bones can be bruised by injury.   The muscles or cartilage can be strained by coughing or overwork.   The cartilage can be affected by inflammation and become sore (costochondritis).  DIAGNOSIS  Lab tests or other studies, such as X-rays, electrocardiography, stress testing, or cardiac imaging, may be needed to find the cause of your pain.  TREATMENT   Treatment depends on what may be causing your chest pain. Treatment may include:   Acid blockers for heartburn.   Anti-inflammatory medicine.   Pain medicine for inflammatory conditions.   Antibiotics if an infection is present.   You may be advised to change lifestyle habits. This includes stopping smoking and avoiding alcohol, caffeine, and chocolate.   You may be advised to keep your head raised (elevated) when sleeping. This reduces the chance of acid going backward from your stomach into your esophagus.   Most of the time, nonspecific chest pain will improve within 2 to 3 days with rest and mild pain medicine.  HOME CARE  INSTRUCTIONS   If antibiotics were prescribed, take your antibiotics as directed. Finish them even if you start to feel better.   For the next few days, avoid physical activities that bring on chest pain. Continue physical activities as directed.   Do not smoke.   Avoid drinking alcohol.   Only take over-the-counter or prescription medicine for pain, discomfort, or fever as directed by your caregiver.   Follow your caregiver's suggestions for further testing if your chest pain does not go away.   Keep any follow-up appointments you made. If you do not go to an appointment, you could develop lasting (chronic) problems with pain. If there is any problem keeping an appointment, you must call to reschedule.  SEEK MEDICAL CARE IF:   You think you are having problems from the medicine you are taking. Read your medicine instructions carefully.   Your chest pain does not go away, even after treatment.   You develop a rash with blisters on your chest.  SEEK IMMEDIATE MEDICAL CARE IF:   You have increased chest pain or pain that spreads to your arm, neck, jaw, back, or abdomen.   You develop shortness of breath, an increasing cough, or you are coughing up blood.   You have severe back or abdominal pain, feel nauseous, or vomit.   You develop severe weakness, fainting, or chills.   You have a fever.  THIS IS AN EMERGENCY. Do not wait to see if the pain will go away. Get medical help  at once. Call your local emergency services (911 in [place] Do not drive yourself to the hospital. MAKE SURE YOU:   Understand these instructions.   Will watch your condition.   Will get help right away if you are not doing well or get worse.  Document Released: 05/15/2005 Document Revised: 07/25/2011 Document Reviewed: 03/10/2008 [facility] Patient Information 2012 [place], [place].

## 2011-11-22 NOTE — ED Notes (Signed)
Pt d/c at 0140

## 2012-06-02 IMAGING — CT CT ANGIO CHEST
2 of 6 series · 19 of 46 positions shown · IV contrast (APPLIED)
Comparison: Chest CT 01/15/2011.

CLINICAL DATA: Chest pain.  History of prior pulmonary embolism.

CT ANGIOGRAPHY CHEST
TECHNIQUE: Multidetector CT imaging of the chest using the
standard protocol during bolus administration of intravenous
contrast. Multiplanar reconstructed images including MIPs were
obtained and reviewed to evaluate the vascular anatomy.
Contrast:  100 ml Bmnipaque-M77

[Series 6: pulm embolism 1.0 b25f thin · axial · 0.70mm/px · z∈[-318,-63]mm · 16 of 281 slices shown]
[im 13/281  lung]
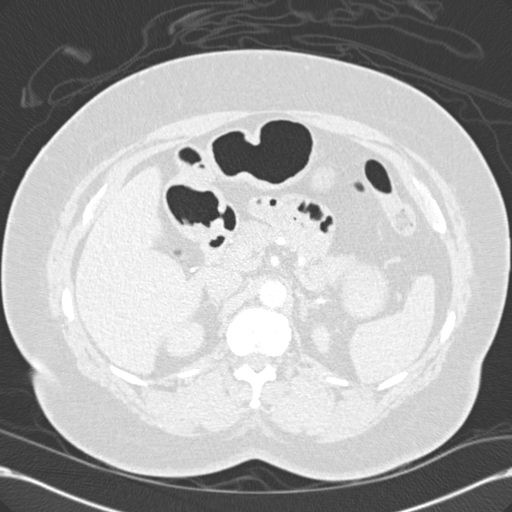
[im 37/281  soft-tissue]
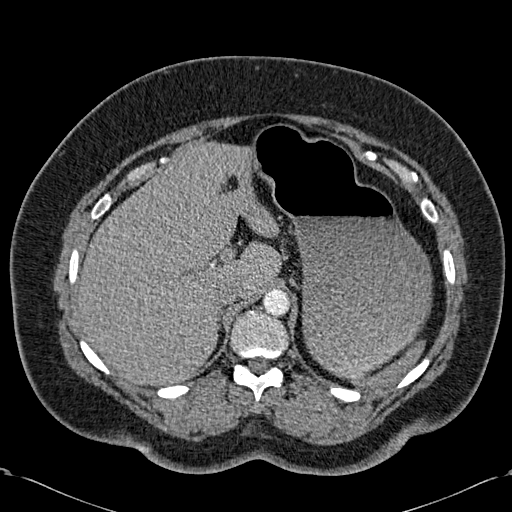
[im 49/281  lung]
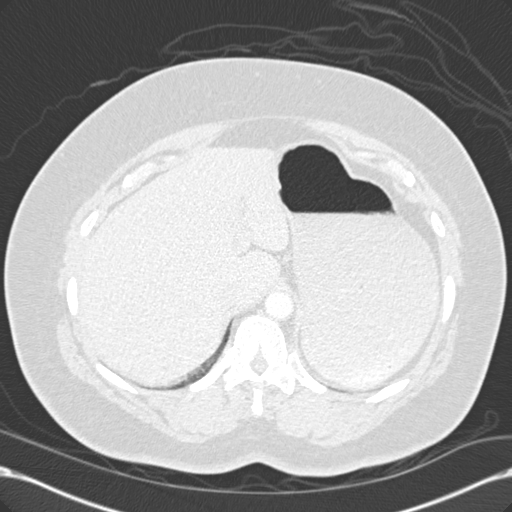
[im 61/281  soft-tissue]
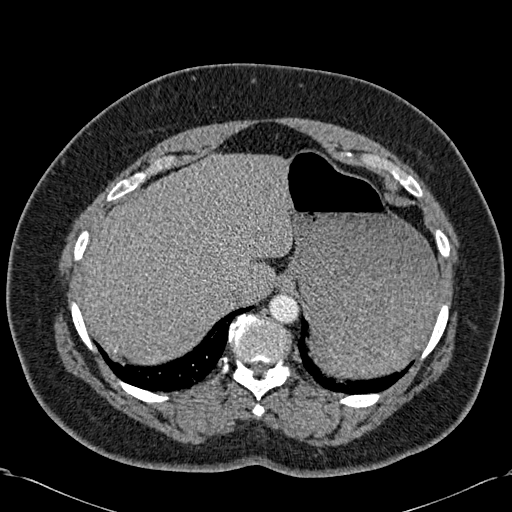
[im 86/281  lung]
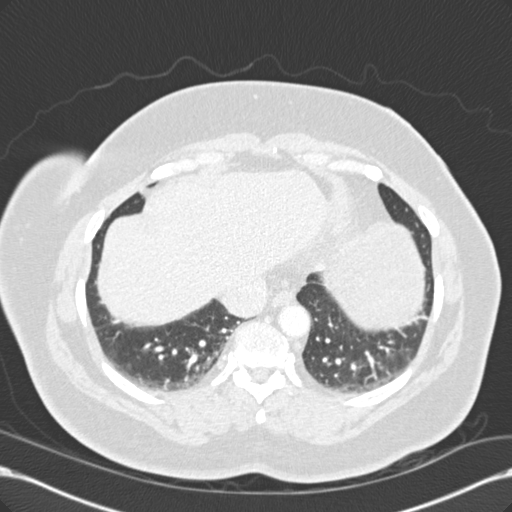
[im 98/281  soft-tissue]
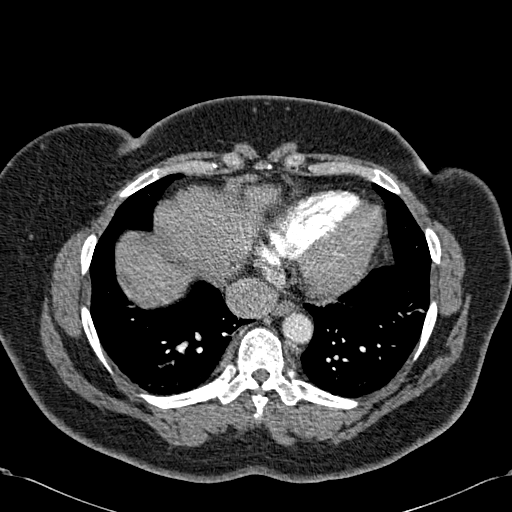
[im 110/281  lung]
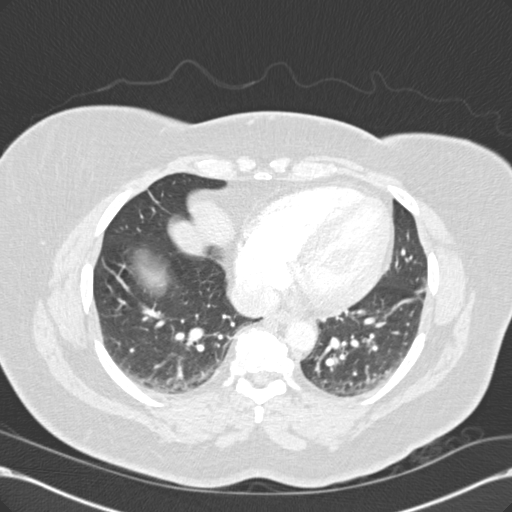
[im 134/281  soft-tissue]
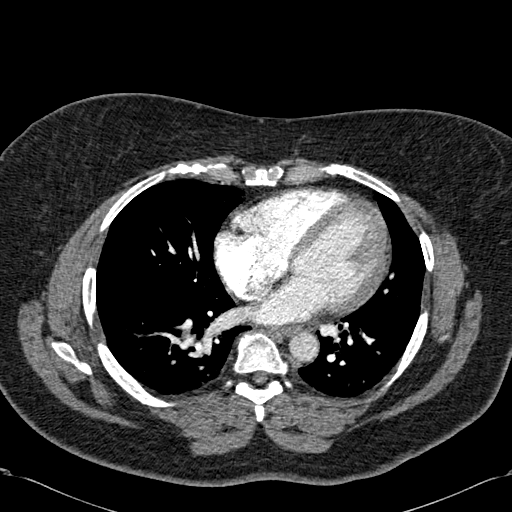
[im 147/281  lung]
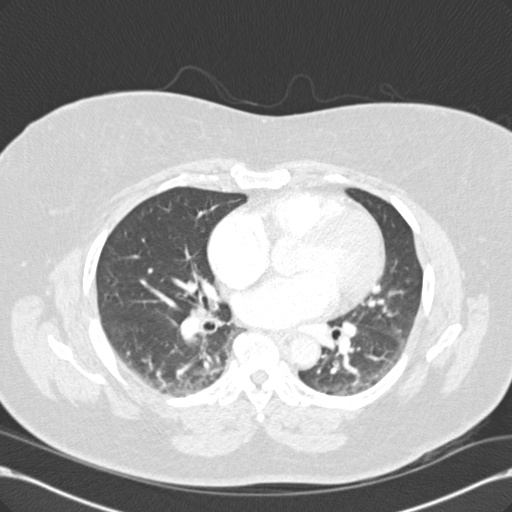
[im 171/281  soft-tissue]
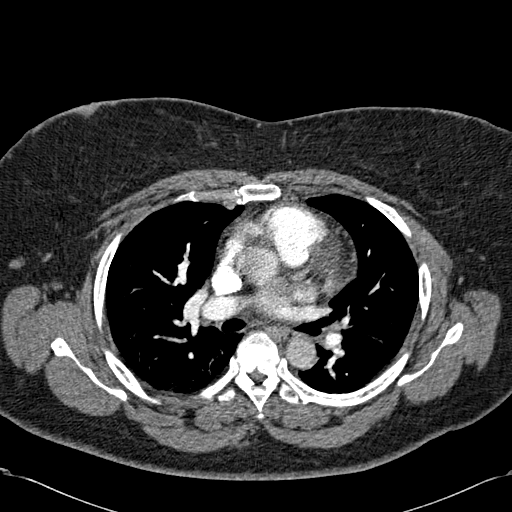
[im 183/281  lung]
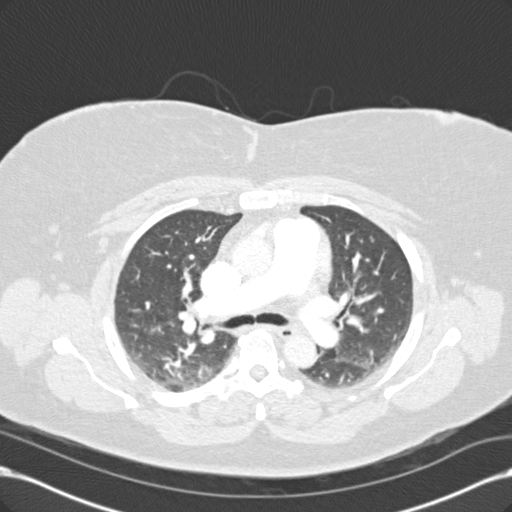
[im 195/281  soft-tissue]
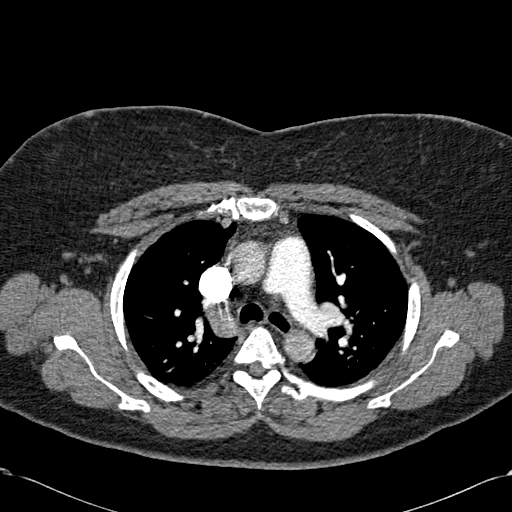
[im 220/281  lung]
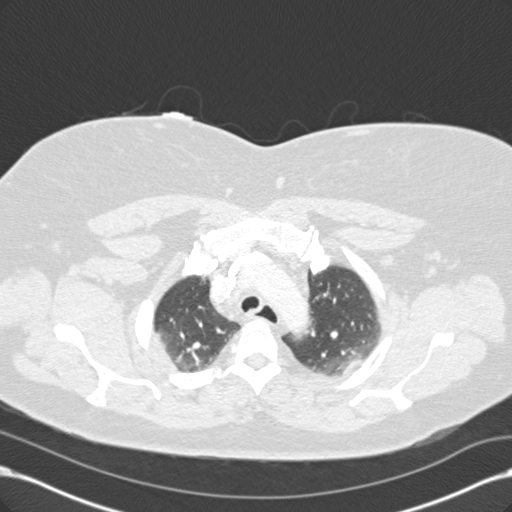
[im 232/281  soft-tissue]
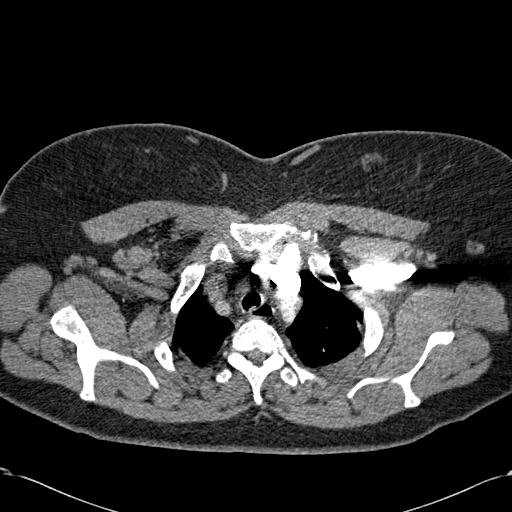
[im 244/281  lung]
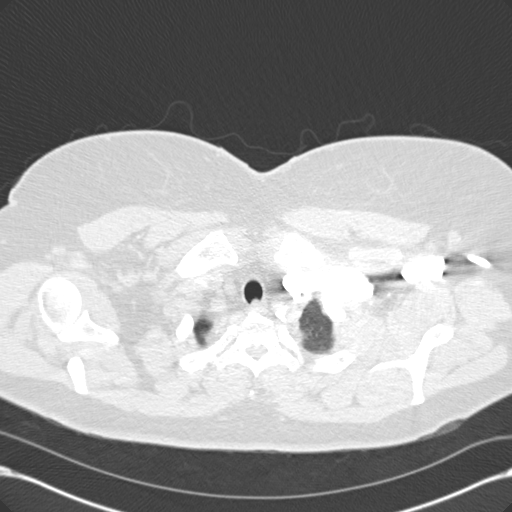
[im 268/281  soft-tissue]
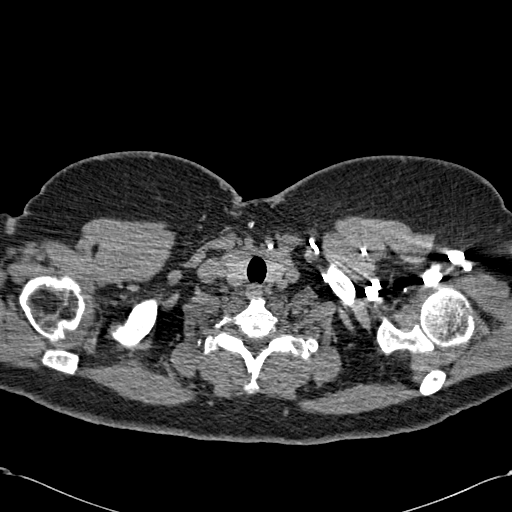

[Series 8: coronals · coronal · 0.71mm/px · 3 of 124 slices shown]
[im 31/124  soft-tissue]
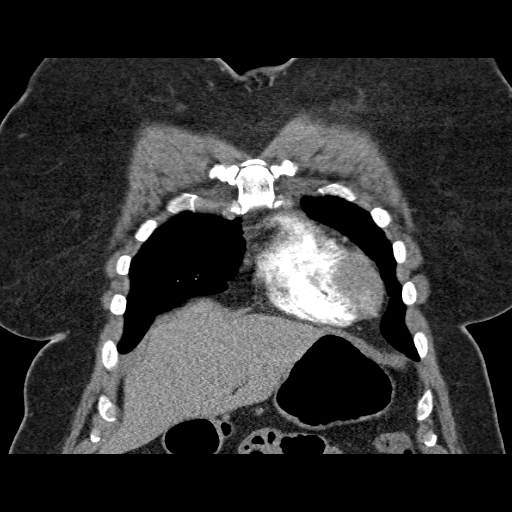
[im 62/124  soft-tissue]
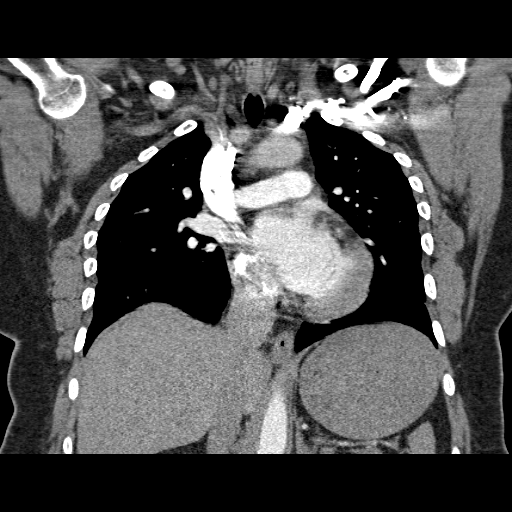
[im 93/124  soft-tissue]
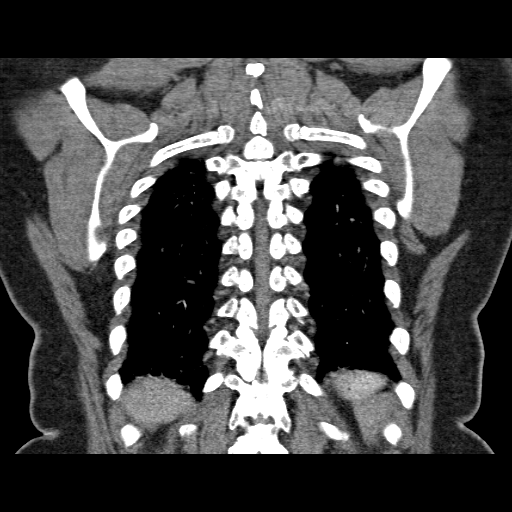

[19 of 46 positions shown; findings below may reference images not displayed]

FINDINGS: The chest wall is unremarkable.  No breast masses,
supraclavicular or axillary lymphadenopathy.  Small scattered lymph
nodes are noted.  The thyroid gland appears normal.  The bony
thorax is intact.

The heart is within normal limits in size.  No pericardial
effusion.  No mediastinal or hilar lymphadenopathy.  The aorta is
normal in caliber.  No dissection.  The esophagus is grossly
normal.

The pulmonary arterial tree is fairly well opacified.  No definite
filling defects to suggest pulmonary emboli.

Examination of the lung parenchyma demonstrates dependent
atelectasis bilaterally.  No infiltrates, edema or effusions.  No
worrisome pulmonary mass or nodules.

The upper abdomen is unremarkable.
IMPRESSION: 1.  No CT findings for pulmonary embolism.
2.  Normal thoracic aorta.
3.  No acute pulmonary findings.  Dependent atelectasis is noted.

## 2012-12-09 ENCOUNTER — Encounter (HOSPITAL_COMMUNITY): Payer: Self-pay | Admitting: *Deleted

## 2012-12-09 ENCOUNTER — Emergency Department (HOSPITAL_COMMUNITY)
Admission: EM | Admit: 2012-12-09 | Discharge: 2012-12-10 | Disposition: A | Discharge status: Home / Self Care | Payer: BC Managed Care – PPO | Attending: Emergency Medicine | Admitting: Emergency Medicine

## 2012-12-09 ENCOUNTER — Emergency Department (HOSPITAL_COMMUNITY): Payer: BC Managed Care – PPO

## 2012-12-09 DIAGNOSIS — R110 Nausea: Secondary | ICD-10-CM | POA: Insufficient documentation

## 2012-12-09 DIAGNOSIS — Z79899 Other long term (current) drug therapy: Secondary | ICD-10-CM | POA: Insufficient documentation

## 2012-12-09 DIAGNOSIS — Z86718 Personal history of other venous thrombosis and embolism: Secondary | ICD-10-CM | POA: Insufficient documentation

## 2012-12-09 DIAGNOSIS — E785 Hyperlipidemia, unspecified: Secondary | ICD-10-CM | POA: Insufficient documentation

## 2012-12-09 DIAGNOSIS — Z8739 Personal history of other diseases of the musculoskeletal system and connective tissue: Secondary | ICD-10-CM | POA: Insufficient documentation

## 2012-12-09 DIAGNOSIS — R071 Chest pain on breathing: Secondary | ICD-10-CM | POA: Insufficient documentation

## 2012-12-09 DIAGNOSIS — R0789 Other chest pain: Secondary | ICD-10-CM

## 2012-12-09 DIAGNOSIS — I10 Essential (primary) hypertension: Secondary | ICD-10-CM | POA: Insufficient documentation

## 2012-12-09 LAB — BASIC METABOLIC PANEL
BUN: 10 mg/dL (ref 6–23)
CO2: 26 mEq/L (ref 19–32)
Calcium: 9.1 mg/dL (ref 8.4–10.5)
Chloride: 102 mEq/L (ref 96–112)
Creatinine, Ser: 0.83 mg/dL (ref 0.50–1.10)
GFR calc Af Amer: >90 mL/min (ref >90)

## 2012-12-09 LAB — CBC
HCT: 38.9 % (ref 36.0–46.0)
MCH: 27 pg (ref 26.0–34.0)
MCV: 79.1 fL (ref 78.0–100.0)
RDW: 13.8 % (ref 11.5–15.5)
WBC: 7.9 10*3/uL (ref 4.0–10.5)

## 2012-12-09 NOTE — ED Notes (Signed)
Pt states she has had CP x 1 week and has gotten worse today.  States pain increases with inspiration.  Denies SOB, n/v, fever, weakness/dizziness

## 2012-12-10 LAB — POCT I-STAT TROPONIN I

## 2012-12-10 MED ORDER — ONDANSETRON HCL 4 MG/2ML IJ SOLN
4.0000 mg | Freq: Once | INTRAMUSCULAR | Status: AC
  Administered 2012-12-10: 4 mg via INTRAVENOUS
  Filled 2012-12-10: qty 2

## 2012-12-10 MED ORDER — ASPIRIN 81 MG PO CHEW
81.0000 mg | CHEWABLE_TABLET | Freq: Once | ORAL | Status: AC
  Administered 2012-12-10: 81 mg via ORAL

## 2012-12-10 MED ORDER — ASPIRIN 81 MG PO CHEW
324.0000 mg | CHEWABLE_TABLET | Freq: Once | ORAL | Status: DC
  Filled 2012-12-10: qty 4

## 2012-12-10 MED ORDER — POTASSIUM CHLORIDE CRYS ER 20 MEQ PO TBCR
20.0000 meq | EXTENDED_RELEASE_TABLET | Freq: Once | ORAL | Status: AC
  Administered 2012-12-10: 20 meq via ORAL
  Filled 2012-12-10: qty 1

## 2012-12-10 MED ORDER — NITROGLYCERIN 0.4 MG SL SUBL: 0.4000 mg | SUBLINGUAL_TABLET | SUBLINGUAL | Status: DC | PRN

## 2012-12-10 MED ORDER — MORPHINE SULFATE 4 MG/ML IJ SOLN
4.0000 mg | Freq: Once | INTRAMUSCULAR | Status: AC
  Administered 2012-12-10: 4 mg via INTRAVENOUS
  Filled 2012-12-10: qty 1

## 2012-12-10 MED ORDER — NITROGLYCERIN IN D5W 200-5 MCG/ML-% IV SOLN: 5.0000 ug/min | INTRAVENOUS | Status: DC

## 2012-12-10 MED ORDER — HYDROCODONE-ACETAMINOPHEN 5-325 MG PO TABS: 2.0000 | ORAL_TABLET | ORAL | Status: DC | PRN

## 2012-12-10 NOTE — ED Notes (Signed)
Patient given discharge instructions for chest wall pain. rx for norco. Advised to follow up with primary care with first available appointment. Patient voiced understanding of all instructions and had no further questions. Patient ambulated to lobby without difficulty.

## 2012-12-10 NOTE — ED Notes (Signed)
Pharmacy in room  

## 2012-12-10 NOTE — ED Provider Notes (Signed)
History     CSN: [id number]  Arrival date & time 12/09/12  2048   First MD Initiated Contact with Patient 12/10/12 0008      Chief Complaint  Patient presents with  . Chest Pain    (Consider location/radiation/quality/duration/timing/severity/associated sxs/prior treatment) HPI Hx per PT . L sided CP going on for the last week, now worse tonight radiates to L arm, has h/o HTN, HLD and PE on Xarelto. No SOB. Some nausea no vomiting. No diaphoresis. At worse is 8/10 and now 8/10. No cough, fever, trauma arm or leg swelling, h/o stress test years ago - was told that it was normal,. No h/o similar symptoms. Pain worse with palpation and moving her L arm. PT denies missing any doses of Xarelto and has no dyspnea like she did when diagnosed with PE.  Past Medical History  Diagnosis Date  . Leg pain     right  . Hyperlipidemia   . Hypertension   . DVT (deep venous thrombosis)   . PE (pulmonary embolism)     Past Surgical History  Procedure Laterality Date  . Back surgery    . Abdominal hysterectomy    . Cholecystectomy      Gall Bladder removal  . Appendectomy    . Spine surgery      decompression surgery  . Laminotomy      right at L4-5 with a disc bulge and superimposed right lateral recess protrusion encroaching on the L5 root    Family History  Problem Relation Age of Onset  . Hypertension Other   . Heart disease Other   . Diabetes Other   . Coronary artery disease Other     History  Substance Use Topics  . Smoking status: Never Smoker   . Smokeless tobacco: Not on file  . Alcohol Use: No    OB History   Grav Para Term Preterm Abortions TAB SAB Ect Mult Living                  Review of Systems  Constitutional: Negative for fever and chills.  HENT: Negative for neck pain and neck stiffness.   Eyes: Negative for pain.  Respiratory: Negative for shortness of breath.   Cardiovascular: Positive for chest pain. Negative for palpitations.  Gastrointestinal:  Positive for nausea. Negative for abdominal pain.  Genitourinary: Negative for dysuria.  Musculoskeletal: Negative for back pain.  Skin: Negative for rash.  Neurological: Negative for headaches.  All other systems reviewed and are negative.    Allergies  Ciprofloxacin  Home Medications   Current Outpatient Rx  Name  Route  Sig  Dispense  Refill  . citalopram (CELEXA) 20 MG tablet   Oral   Take 20 mg by mouth daily.         . cyclobenzaprine (FLEXERIL) 5 MG tablet   Oral   Take 5 mg by mouth 2 (two) times daily with a meal.         . HYDROcodone-acetaminophen (NORCO) 5-325 MG per tablet   Oral   Take 1 tablet by mouth every 4 (four) hours as needed. For pain          . montelukast (SINGULAIR) 10 MG tablet   Oral   Take 10 mg by mouth at bedtime.         Marland Kitchen olmesartan-hydrochlorothiazide (BENICAR HCT) 40-12.5 MG per tablet   Oral   Take 1 tablet by mouth daily.         . Potassium  Chloride (KLOR-CON PO)   Oral   Take 20 mEq by mouth daily.          . rivaroxaban (XARELTO) 10 MG TABS tablet   Oral   Take 20 mg by mouth daily.          . simvastatin (ZOCOR) 40 MG tablet   Oral   Take 40 mg by mouth every evening.           BP 138/75  Pulse 80  Temp(Src) 98.3 F (36.8 C) (Oral)  Resp 14  SpO2 98%  Physical Exam  Constitutional: She is oriented to person, place, and time. She appears well-developed and well-nourished.  HENT:  Head: Normocephalic and atraumatic.  Eyes: Conjunctivae and EOM are normal. Pupils are equal, round, and reactive to light.  Neck: Trachea normal. Neck supple. No thyromegaly present.  Cardiovascular: Normal rate, regular rhythm, S1 normal, S2 normal and normal pulses.     No systolic murmur is present   No diastolic murmur is present  Pulses:      Radial pulses are 2+ on the right side, and 2+ on the left side.  Pulmonary/Chest: Effort normal and breath sounds normal. She has no wheezes. She has no rhonchi. She has  no rales.  Reproducible chest wall TTP L anterior , no crepitus or rash  Abdominal: Soft. Normal appearance and bowel sounds are normal. There is no tenderness. There is no CVA tenderness and negative Murphy's sign.  Musculoskeletal: Normal range of motion. She exhibits no edema and no tenderness.  calves nontender, no cords or erythema, distal N/V intact x 4  Neurological: She is alert and oriented to person, place, and time. She has normal strength. No cranial nerve deficit or sensory deficit. GCS eye subscore is 4. GCS verbal subscore is 5. GCS motor subscore is 6.  Skin: Skin is warm and dry. No rash noted. She is not diaphoretic.  Psychiatric: Her speech is normal.  Cooperative and appropriate    ED Course  Procedures (including critical care time)  Results for orders placed during the hospital encounter of 12/09/12  CBC      Result Value Range   WBC 7.9  4.0 - 10.5 K/uL   RBC 4.92  3.87 - 5.11 MIL/uL   Hemoglobin 13.3  12.0 - 15.0 g/dL   HCT 16.1  09.6 - 04.5 %   MCV 79.1  78.0 - 100.0 fL   MCH 27.0  26.0 - 34.0 pg   MCHC 34.2  30.0 - 36.0 g/dL   RDW 40.9  81.1 - 91.4 %   Platelets 250  150 - 400 K/uL  BASIC METABOLIC PANEL      Result Value Range   Sodium 140  135 - 145 mEq/L   Potassium 3.3 (*) 3.5 - 5.1 mEq/L   Chloride 102  96 - 112 mEq/L   CO2 26  19 - 32 mEq/L   Glucose, Bld 92  70 - 99 mg/dL   BUN 10  6 - 23 mg/dL   Creatinine, Ser 7.82  0.50 - 1.10 mg/dL   Calcium 9.1  8.4 - 95.6 mg/dL   GFR calc non Af Amer 78 (*) >90 mL/min   GFR calc Af Amer >90  >90 mL/min  POCT I-STAT TROPONIN I      Result Value Range   Troponin i, poc 0.00  0.00 - 0.08 ng/mL   Comment 3           POCT I-STAT TROPONIN I  Result Value Range   Troponin i, poc 0.00  0.00 - 0.08 ng/mL   Comment 3            Dg Chest 2 View  12/09/2012  *RADIOLOGY REPORT*  Clinical Data: Chest pain and shortness of breath.  CHEST - 2 VIEW  Comparison: 11/21/2011  Findings: Lateral view degraded by  patient arm position.  Midline trachea.  Normal heart size.  The right hilum is borderline prominent, this is similar to on the prior exam and no adenopathy is seen in this area. No pleural effusion or pneumothorax. There is a minimal right midlung scarring or atelectasis, similar to on the prior exam.  The left lung is clear.  IMPRESSION: No acute cardiopulmonary disease.   Original Report Authenticated By: Jeronimo Greaves, M.D.      Date: 12/10/2012  Rate: 81  Rhythm: sinus arrhythmia  QRS Axis: normal  Intervals: QT prolonged  ST/T Wave abnormalities: nonspecific ST/T changes  Conduction Disutrbances:none  Narrative Interpretation:   Old EKG Reviewed: none available  IV morphine. PO potassium mild hypokalemia  MDM  Reproducible chest wall tenderness by palpation and L arm movement  Evaluated with serial troponins, ECG, CXR  Pain improved IV narcotics  Has some ACS risk factors but presentation does not suggest the same.   CP precautions provided, plan close outpatient follow up.       Sunnie Nielsen, MD 12/10/12 [phone]

## 2013-11-13 ENCOUNTER — Emergency Department (HOSPITAL_COMMUNITY)
Admission: EM | Admit: 2013-11-13 | Discharge: 2013-11-13 | Disposition: A | Discharge status: Home / Self Care | Payer: BC Managed Care – PPO | Attending: Emergency Medicine | Admitting: Emergency Medicine

## 2013-11-13 ENCOUNTER — Emergency Department (HOSPITAL_COMMUNITY): Payer: BC Managed Care – PPO

## 2013-11-13 DIAGNOSIS — Z79899 Other long term (current) drug therapy: Secondary | ICD-10-CM | POA: Insufficient documentation

## 2013-11-13 DIAGNOSIS — J209 Acute bronchitis, unspecified: Secondary | ICD-10-CM

## 2013-11-13 DIAGNOSIS — E785 Hyperlipidemia, unspecified: Secondary | ICD-10-CM | POA: Insufficient documentation

## 2013-11-13 DIAGNOSIS — Z86711 Personal history of pulmonary embolism: Secondary | ICD-10-CM | POA: Insufficient documentation

## 2013-11-13 DIAGNOSIS — Z86718 Personal history of other venous thrombosis and embolism: Secondary | ICD-10-CM | POA: Insufficient documentation

## 2013-11-13 DIAGNOSIS — I10 Essential (primary) hypertension: Secondary | ICD-10-CM | POA: Insufficient documentation

## 2013-11-13 DIAGNOSIS — Z7901 Long term (current) use of anticoagulants: Secondary | ICD-10-CM | POA: Insufficient documentation

## 2013-11-13 MED ORDER — PREDNISONE 20 MG PO TABS
60.0000 mg | ORAL_TABLET | Freq: Once | ORAL | Status: AC
  Administered 2013-11-13: 60 mg via ORAL
  Filled 2013-11-13: qty 3

## 2013-11-13 MED ORDER — ALBUTEROL SULFATE (2.5 MG/3ML) 0.083% IN NEBU
5.0000 mg | INHALATION_SOLUTION | Freq: Once | RESPIRATORY_TRACT | Status: AC
  Administered 2013-11-13: 5 mg via RESPIRATORY_TRACT
  Filled 2013-11-13: qty 6

## 2013-11-13 MED ORDER — PREDNISONE 50 MG PO TABS: 50.0000 mg | ORAL_TABLET | Freq: Every day | ORAL | Status: DC

## 2013-11-13 MED ORDER — ALBUTEROL SULFATE HFA 108 (90 BASE) MCG/ACT IN AERS: 2.0000 | INHALATION_SPRAY | RESPIRATORY_TRACT | Status: DC | PRN

## 2013-11-13 MED ORDER — IPRATROPIUM-ALBUTEROL 0.5-2.5 (3) MG/3ML IN SOLN
3.0000 mL | Freq: Once | RESPIRATORY_TRACT | Status: AC
  Administered 2013-11-13: 3 mL via RESPIRATORY_TRACT
  Filled 2013-11-13: qty 3

## 2013-11-13 MED ORDER — HYDROCOD POLST-CHLORPHEN POLST 10-8 MG/5ML PO LQCR: 5.0000 mL | Freq: Every evening | ORAL | Status: DC | PRN

## 2013-11-13 NOTE — ED Notes (Signed)
Non-productive cough x 7 days. Taking antibiotics? Reproducible chest wall pain - some wheezing - singulair.  vss 99% , 140/76, 18;

## 2013-11-13 NOTE — ED Notes (Signed)
VS stable. Pt is ambulatory and leaving with her husband.

## 2013-11-13 NOTE — Discharge Instructions (Signed)
Acute Bronchitis Bronchitis is inflammation of the airways that extend from the windpipe into the lungs (bronchi). The inflammation often causes mucus to develop. This leads to a cough, which is the most common symptom of bronchitis.  In acute bronchitis, the condition usually develops suddenly and goes away over time, usually in a couple weeks. Smoking, allergies, and asthma can make bronchitis worse. Repeated episodes of bronchitis may cause further lung problems.  CAUSES Acute bronchitis is most often caused by the same virus that causes a cold. The virus can spread from person to person (contagious).  SIGNS AND SYMPTOMS   Cough.   Fever.   Coughing up mucus.   Body aches.   Chest congestion.   Chills.   Shortness of breath.   Sore throat.  DIAGNOSIS  Acute bronchitis is usually diagnosed through a physical exam. Tests, such as chest X-rays, are sometimes done to rule out other conditions.  TREATMENT  Acute bronchitis usually goes away in a couple weeks. Often times, no medical treatment is necessary. Medicines are sometimes given for relief of fever or cough. Antibiotics are usually not needed but may be prescribed in certain situations. In some cases, an inhaler may be recommended to help reduce shortness of breath and control the cough. A cool mist vaporizer may also be used to help thin bronchial secretions and make it easier to clear the chest.  HOME CARE INSTRUCTIONS  Get plenty of rest.   Drink enough fluids to keep your urine clear or pale yellow (unless you have a medical condition that requires fluid restriction). Increasing fluids may help thin your secretions and will prevent dehydration.   Only take over-the-counter or prescription medicines as directed by your health care provider.   Avoid smoking and secondhand smoke. Exposure to cigarette smoke or irritating chemicals will make bronchitis worse. If you are a smoker, consider using nicotine gum or skin  patches to help control withdrawal symptoms. Quitting smoking will help your lungs heal faster.   Reduce the chances of another bout of acute bronchitis by washing your hands frequently, avoiding people with cold symptoms, and trying not to touch your hands to your mouth, nose, or eyes.   Follow up with your health care provider as directed.  SEEK MEDICAL CARE IF: Your symptoms do not improve after 1 week of treatment.  SEEK IMMEDIATE MEDICAL CARE IF:  You develop an increased fever or chills.   You have chest pain.   You have severe shortness of breath.  You have bloody sputum.   You develop dehydration.  You develop fainting.  You develop repeated vomiting.  You develop a severe headache. MAKE SURE YOU:   Understand these instructions.  Will watch your condition.  Will get help right away if you are not doing well or get worse. Document Released: 09/12/2004 Document Revised: 04/07/2013 Document Reviewed: 01/26/2013 [facility] Patient Information 2014 [employer], [place].  Albuterol inhalation aerosol What is this medicine? ALBUTEROL (al Gas[address] BiddingBYOO ter ole) is a bronchodilator. It helps open up the airways in your lungs to make it easier to breathe. This medicine is used to treat and to prevent bronchospasm. This medicine may be used for other purposes; ask your health care provider or pharmacist if you have questions. COMMON BRAND NAME(S): Proair HFA, Proventil HFA, Proventil, Respirol , Ventolin HFA, Ventolin What should I tell my health care provider before I take this medicine? They need to know if you have any of the following conditions: -diabetes -heart disease or irregular heartbeat -  high blood pressure -pheochromocytoma -seizures -thyroid disease -an unusual or allergic reaction to albuterol, levalbuterol, sulfites, other medicines, foods, dyes, or preservatives -pregnant or trying to get pregnant -breast-feeding How should I use this medicine? This medicine is  for inhalation through the mouth. Follow the directions on your prescription label. Take your medicine at regular intervals. Do not use more often than directed. Make sure that you are using your inhaler correctly. Ask you doctor or health care provider if you have any questions. Talk to your pediatrician regarding the use of this medicine in children. Special care may be needed. Overdosage: If you think you have taken too much of this medicine contact a poison control center or emergency room at once. NOTE: This medicine is only for you. Do not share this medicine with others. What if I miss a dose? If you miss a dose, use it as soon as you can. If it is almost time for your next dose, use only that dose. Do not use double or extra doses. What may interact with this medicine? -anti-infectives like chloroquine and pentamidine -caffeine -cisapride -diuretics -medicines for colds -medicines for depression or for emotional or psychotic conditions -medicines for weight loss including some herbal products -methadone -some antibiotics like clarithromycin, erythromycin, levofloxacin, and linezolid -some heart medicines -steroid hormones like dexamethasone, cortisone, hydrocortisone -theophylline -thyroid hormones This list may not describe all possible interactions. Give your health care provider a list of all the medicines, herbs, non-prescription drugs, or dietary supplements you use. Also tell them if you smoke, drink alcohol, or use illegal drugs. Some items may interact with your medicine. What should I watch for while using this medicine? Tell your doctor or health care professional if your symptoms do not improve. Do not use extra albuterol. If your asthma or bronchitis gets worse while you are using this medicine, call your doctor right away. If your mouth gets dry try chewing sugarless gum or sucking hard candy. Drink water as directed. What side effects may I notice from receiving this  medicine? Side effects that you should report to your doctor or health care professional as soon as possible: -allergic reactions like skin rash, itching or hives, swelling of the face, lips, or tongue -breathing problems -chest pain -feeling faint or lightheaded, falls -high blood pressure -irregular heartbeat -fever -muscle cramps or weakness -pain, tingling, numbness in the hands or feet -vomiting Side effects that usually do not require medical attention (report to your doctor or health care professional if they continue or are bothersome): -cough -difficulty sleeping -headache -nervousness or trembling -stomach upset -stuffy or runny nose -throat irritation -unusual taste This list may not describe all possible side effects. Call your doctor for medical advice about side effects. You may report side effects to FDA at 1-800-FDA-1088. Where should I keep my medicine? Keep out of the reach of children. Store at room temperature between 15 and 30 degrees C (59 and 86 degrees F). The contents are under pressure and may burst when exposed to heat or flame. Do not freeze. This medicine does not work as well if it is too cold. Throw away any unused medicine after the expiration date. Inhalers need to be thrown away after the labeled number of puffs have been used or by the expiration date; whichever comes first. Ventolin HFA should be thrown away 12 months after removing from foil pouch. Check the instructions that come with your medicine. NOTE: This sheet is a summary. It may not cover all possible information.  If you have questions about this medicine, talk to your doctor, pharmacist, or health care provider.  2014, [employer] Standard. (2013-01-21 10:57:17)  Prednisone tablets What is this medicine? PREDNISONE (PRED ni sone) is a corticosteroid. It is commonly used to treat inflammation of the skin, joints, lungs, and other organs. Common conditions treated include asthma,  allergies, and arthritis. It is also used for other conditions, such as blood disorders and diseases of the adrenal glands. This medicine may be used for other purposes; ask your health care provider or pharmacist if you have questions. COMMON BRAND NAME(S): Deltasone, Predone, Sterapred DS, Sterapred What should I tell my health care provider before I take this medicine? They need to know if you have any of these conditions: -Cushing's syndrome -diabetes -glaucoma -heart disease -high blood pressure -infection (especially a virus infection such as chickenpox, cold sores, or herpes) -kidney disease -liver disease -mental illness -myasthenia gravis -osteoporosis -seizures -stomach or intestine problems -thyroid disease -an unusual or allergic reaction to lactose, prednisone, other medicines, foods, dyes, or preservatives -pregnant or trying to get pregnant -breast-feeding How should I use this medicine? Take this medicine by mouth with a glass of water. Follow the directions on the prescription label. Take this medicine with food. If you are taking this medicine once a day, take it in the morning. Do not take more medicine than you are told to take. Do not suddenly stop taking your medicine because you may develop a severe reaction. Your doctor will tell you how much medicine to take. If your doctor wants you to stop the medicine, the dose may be slowly lowered over time to avoid any side effects. Talk to your pediatrician regarding the use of this medicine in children. Special care may be needed. Overdosage: If you think you have taken too much of this medicine contact a poison control center or emergency room at once. NOTE: This medicine is only for you. Do not share this medicine with others. What if I miss a dose? If you miss a dose, take it as soon as you can. If it is almost time for your next dose, talk to your doctor or health care professional. You may need to miss a dose or take  an extra dose. Do not take double or extra doses without advice. What may interact with this medicine? Do not take this medicine with any of the following medications: -metyrapone -mifepristone This medicine may also interact with the following medications: -aminoglutethimide -amphotericin B -aspirin and aspirin-like medicines -barbiturates -certain medicines for diabetes, like glipizide or glyburide -cholestyramine -cholinesterase inhibitors -cyclosporine -digoxin -diuretics -ephedrine -female hormones, like estrogens and birth control pills -isoniazid -ketoconazole -NSAIDS, medicines for pain and inflammation, like ibuprofen or naproxen -phenytoin -rifampin -toxoids -vaccines -warfarin This list may not describe all possible interactions. Give your health care provider a list of all the medicines, herbs, non-prescription drugs, or dietary supplements you use. Also tell them if you smoke, drink alcohol, or use illegal drugs. Some items may interact with your medicine. What should I watch for while using this medicine? Visit your doctor or health care professional for regular checks on your progress. If you are taking this medicine over a prolonged period, carry an identification card with your name and address, the type and dose of your medicine, and your doctor's name and address. This medicine may increase your risk of getting an infection. Tell your doctor or health care professional if you are around anyone with measles or chickenpox, or if you  develop sores or blisters that do not heal properly. If you are going to have surgery, tell your doctor or health care professional that you have taken this medicine within the last twelve months. Ask your doctor or health care professional about your diet. You may need to lower the amount of salt you eat. This medicine may affect blood sugar levels. If you have diabetes, check with your doctor or health care professional before you change  your diet or the dose of your diabetic medicine. What side effects may I notice from receiving this medicine? Side effects that you should report to your doctor or health care professional as soon as possible: -allergic reactions like skin rash, itching or hives, swelling of the face, lips, or tongue -changes in emotions or moods -changes in vision -depressed mood -eye pain -fever or chills, cough, sore throat, pain or difficulty passing urine -increased thirst -swelling of ankles, feet Side effects that usually do not require medical attention (report to your doctor or health care professional if they continue or are bothersome): -confusion, excitement, restlessness -headache -nausea, vomiting -skin problems, acne, thin and shiny skin -trouble sleeping -weight gain This list may not describe all possible side effects. Call your doctor for medical advice about side effects. You may report side effects to FDA at 1-800-FDA-1088. Where should I keep my medicine? Keep out of the reach of children. Store at room temperature between 15 and 30 degrees C (59 and 86 degrees F). Protect from light. Keep container tightly closed. Throw away any unused medicine after the expiration date. NOTE: This sheet is a summary. It may not cover all possible information. If you have questions about this medicine, talk to your doctor, pharmacist, or health care provider.  2014, Elsevier/Gold Standard. (2011-03-21 10:57:14)  Chlorpheniramine; Hydrocodone oral solution or suspension What is this medicine? CHLORPHENIRAMINE; HYDROCODONE (klor fen IR a meen; hye droe KOE done) is a combination of an antihistamine and cough suppressant. It is used to treat the symptoms of allergies and colds. This medicine may be used for other purposes; ask your health care provider or pharmacist if you have questions. COMMON BRAND NAME(S): HyTan , Novasus, S-T Forte 2, Tussionex, VITUZ What should I tell my health care provider  before I take this medicine? They need to know if you have any of these conditions: -diabetes -head injury -intestine problems -kidney disease -liver disease -other chronic illness -trouble breathing -an unusual or allergic reaction to chlorpheniramine, codeine, hydrocodone, other pain medicines, foods, dyes or preservatives -pregnant or trying to get pregnant -breast-feeding How should I use this medicine? Take this medicine by mouth with a glass of water. Follow the directions on the prescription label. Shake well before using. Do not mix your dose with other fluids or medicines before you take it. Use a specially marked spoon or container to measure your medicine. Household spoons are not accurate. Take your doses at regular intervals. Do not take your medicine more often than directed. Talk to your pediatrician regarding the use of this medicine in children. This medicine is not approved for use in children less than 50 years old. Overdosage: If you think you have taken too much of this medicine contact a poison control center or emergency room at once. NOTE: This medicine is only for you. Do not share this medicine with others. What if I miss a dose? If you miss a dose, take it as soon as you can. If it is almost time for your next dose, take  only that dose. Do not take double or extra doses. What may interact with this medicine? Do not take this medicine with any of the following medications:  -alcohol This medicine may also interact with the following medications: -antihistamines for cold or allergy -medicines for depression or other mental disturbances -muscle relaxants -narcotic medicines (opiates) for pain -some medicines for anxiety or sleep -some medicines for Parkinson's disease -some medicines for stomach problems -tramadol This list may not describe all possible interactions. Give your health care provider a list of all the medicines, herbs, non-prescription drugs, or  dietary supplements you use. Also tell them if you smoke, drink alcohol, or use illegal drugs. Some items may interact with your medicine. What should I watch for while using this medicine? Tell your doctor or health care professional if your symptoms do not improve or if they get worse. If you also have a high fever, skin rash, continuing headache, or sore throat, see your doctor. You may develop tolerance to this medicine if you take it for a long time. Tolerance means that you will get less cough relief with time. Do not suddenly stop taking your medicine because you may develop a severe reaction. Your body becomes used to the medicine. This does NOT mean you are addicted. Addiction is a behavior related to getting and using a drug for a non-medical reason. If your doctor wants you to stop the medicine, the dose will be slowly lowered over time to avoid any side effects. You may get drowsy or dizzy. Do not drive, use machinery, or do anything that needs mental alertness until you know how this medicine affects you. Do not stand or sit up quickly, especially if you are an older patient. This reduces the risk of dizzy or fainting spells. Alcohol may interfere with the effect of this medicine. Avoid alcoholic drinks. The medicine will cause constipation. Try to have a bowel movement at least every 2 to 3 days. If you do not have a bowel movement for 3 days, call your doctor or health care professional. This medicine may cause dry eyes and blurred vision. If you wear contact lenses you may feel some discomfort. Lubricating drops may help. See your eye doctor if the problem does not go away or is severe. What side effects may I notice from receiving this medicine? Side effects that you should report to your doctor or health care professional as soon as possible: -allergic reactions like skin rash, itching or hives, swelling of the face, lips, or tongue -breathing problems -changes in vision -chest  tightness -cold, clammy skin -confusion, anxiety, fear -trouble passing urine or change in the amount of urine -unusually slow heartbeat -unusually weak or tired Side effects that usually do not require medical attention (report to your doctor or health care professional if they continue or are bothersome): -constipation -dry throat -nausea, vomiting -stomach upset This list may not describe all possible side effects. Call your doctor for medical advice about side effects. You may report side effects to FDA at 1-800-FDA-1088. Where should I keep my medicine? Keep out of the reach of children. This medicine can be abused. Keep this medicine in a safe place to protect it from theft. Do not share this medicine with anyone. Selling or giving away this medicine is dangerous and is against the law. Store at room temperature between 15 and 30 degrees C (59 and 86 degrees F). Do not freeze. Keep container tightly closed. Throw away any unused medicine after the expiration  date. Discard unused medicine and used packaging carefully. Pets and children can be harmed if they find used or lost packages. NOTE: This sheet is a summary. It may not cover all possible information. If you have questions about this medicine, talk to your doctor, pharmacist, or health care provider.  2014, Elsevier/Gold Standard. (2011-04-16 11:12:39)

## 2013-11-13 NOTE — ED Provider Notes (Signed)
CSN: [id number]     Arrival date & time 11/13/13  0009 History   First MD Initiated Contact with Patient 11/13/13 0102     Chief Complaint  Patient presents with  . URI     (Consider location/radiation/quality/duration/timing/severity/associated sxs/prior Treatment) Patient is a 58 y.o. female presenting with URI. The history is provided by the patient.  URI She has had a nonproductive cough for about the last week but it got significantly worse today. There's been no fever, chills, sweats. She denies any chest pain, heaviness, tightness, pressure. There's been no nausea or vomiting or diarrhea. She's not been doing anything for it at home. In the ED, she was given an albuterol nebulizer treatment with some improvement. She denies having similar episodes in the past.  Past Medical History  Diagnosis Date  . Leg pain     right  . Hyperlipidemia   . Hypertension   . DVT (deep venous thrombosis)   . PE (pulmonary embolism)    Past Surgical History  Procedure Laterality Date  . Back surgery    . Abdominal hysterectomy    . Cholecystectomy      Gall Bladder removal  . Appendectomy    . Spine surgery      decompression surgery  . Laminotomy      right at L4-5 with a disc bulge and superimposed right lateral recess protrusion encroaching on the L5 root   Family History  Problem Relation Age of Onset  . Hypertension Other   . Heart disease Other   . Diabetes Other   . Coronary artery disease Other    History  Substance Use Topics  . Smoking status: Never Smoker   . Smokeless tobacco: Not on file  . Alcohol Use: No   OB History   Grav Para Term Preterm Abortions TAB SAB Ect Mult Living                 Review of Systems  All other systems reviewed and are negative.      Allergies  Ciprofloxacin  Home Medications   Current Outpatient Rx  Name  Route  Sig  Dispense  Refill  . amLODipine (NORVASC) 5 MG tablet   Oral   Take 5 mg by mouth daily.         .  Chlorphen-PE-Acetaminophen (NOREL AD PO)   Oral   Take 1 tablet by mouth 4 (four) times daily as needed (allergies).         . citalopram (CELEXA) 20 MG tablet   Oral   Take 20 mg by mouth daily.         Marland Kitchen HYDROcodone-acetaminophen (NORCO/VICODIN) 5-325 MG per tablet   Oral   Take 2 tablets by mouth every 4 (four) hours as needed for pain.   15 tablet   0   . montelukast (SINGULAIR) 10 MG tablet   Oral   Take 10 mg by mouth at bedtime.         Marland Kitchen olmesartan-hydrochlorothiazide (BENICAR HCT) 40-12.5 MG per tablet   Oral   Take 1 tablet by mouth daily.         . potassium chloride SA (K-DUR,KLOR-CON) 20 MEQ tablet   Oral   Take 20 mEq by mouth daily.         . rivaroxaban (XARELTO) 10 MG TABS tablet   Oral   Take 20 mg by mouth daily.          . simvastatin (ZOCOR) 40 MG tablet  Oral   Take 40 mg by mouth every evening.          BP 130/64  Pulse 88  Temp(Src) 98.3 F (36.8 C) (Oral)  Resp 18  Ht 5\' 3"  (1.6 m)  Wt 210 lb (95.255 kg)  BMI 37.21 kg/m2  SpO2 95% Physical Exam  Nursing note and vitals reviewed.  58 year old female, resting comfortably and in no acute distress. Vital signs are normal. Oxygen saturation is 95%, which is normal. Head is normocephalic and atraumatic. PERRLA, EOMI. Oropharynx is clear. Neck is nontender and supple without adenopathy or JVD. Back is nontender and there is no CVA tenderness. Lungs have a prolonged exhalation phase with a few scattered wheezes. No rales or rhonchi. Chest is nontender. Heart has regular rate and rhythm without murmur. Abdomen is soft, flat, nontender without masses or hepatosplenomegaly and peristalsis is normoactive. Extremities have no cyanosis or edema, full range of motion is present. Skin is warm and dry without rash. Neurologic: Mental status is normal, cranial nerves are intact, there are no motor or sensory deficits.  ED Course  Procedures (including critical care time) Imaging  Review Dg Chest 2 View  11/13/2013   CLINICAL DATA:  Cough and congestion  EXAM: CHEST  2 VIEW  COMPARISON:  12/09/2012  FINDINGS: There is streaky opacities in the left more than right lower lungs. No lobar consolidation, edema, effusion, or pneumothorax. Normal heart size. Unchanged mediastinal contours.  IMPRESSION: Mild atelectasis at the bases.   Electronically Signed   By: Tiburcio PeaJonathan  Watts M.D.   On: 11/13/2013 01:21     EKG Interpretation   Date/Time:  Saturday November 13 2013 00:24:26 EDT Ventricular Rate:  84 PR Interval:  142 QRS Duration: 72 QT Interval:  296 QTC Calculation: 349 R Axis:   75 Text Interpretation:  Normal sinus rhythm Nonspecific T wave abnormality  Abnormal ECG When compared with ECG of 12/09/2012, QT has shortened  Confirmed by Preston FleetingGLICK  MD, Jisell Majer ([id number]) on 11/13/2013 1:03:22 AM      MDM   Final diagnoses:  Acute bronchitis    Acute bronchitis. Chest x-ray shows no pneumonia. She had some improvement with initial albuterol by nebulizer and will be given albuterol with ipratropium via nebulizer. She's also given initial dose of prednisone. Old records are reviewed, and she has no prior similar ED visits.  File a second nebulizer treatment, she is feeling much better. She is no longer coughing and on auscultation lungs are clear. She is discharged with prescriptions for prednisone, albuterol inhaler, and hydrocodone-chlorpheniramine.  Dione Boozeavid Maree Ainley, MD 11/13/13 [phone]

## 2013-11-13 NOTE — ED Notes (Signed)
EDP Preston FleetingGlick in room with patient.

## 2013-11-13 NOTE — ED Notes (Signed)
Pt finished albuterol. Pt states she feels like she can breathe a little better.

## 2013-11-13 NOTE — ED Notes (Signed)
EDP [name] in pt room.

## 2013-11-23 ENCOUNTER — Encounter (HOSPITAL_COMMUNITY): Payer: Self-pay | Admitting: Emergency Medicine

## 2013-11-23 ENCOUNTER — Emergency Department (HOSPITAL_COMMUNITY): Payer: BC Managed Care – PPO

## 2013-11-23 ENCOUNTER — Emergency Department (HOSPITAL_COMMUNITY)
Admission: EM | Admit: 2013-11-23 | Discharge: 2013-11-23 | Disposition: A | Discharge status: Home / Self Care | Payer: BC Managed Care – PPO | Attending: Emergency Medicine | Admitting: Emergency Medicine

## 2013-11-23 DIAGNOSIS — Z79899 Other long term (current) drug therapy: Secondary | ICD-10-CM | POA: Insufficient documentation

## 2013-11-23 DIAGNOSIS — Z86711 Personal history of pulmonary embolism: Secondary | ICD-10-CM | POA: Insufficient documentation

## 2013-11-23 DIAGNOSIS — J189 Pneumonia, unspecified organism: Secondary | ICD-10-CM

## 2013-11-23 DIAGNOSIS — Z881 Allergy status to other antibiotic agents status: Secondary | ICD-10-CM | POA: Insufficient documentation

## 2013-11-23 DIAGNOSIS — R042 Hemoptysis: Secondary | ICD-10-CM

## 2013-11-23 DIAGNOSIS — I10 Essential (primary) hypertension: Secondary | ICD-10-CM | POA: Insufficient documentation

## 2013-11-23 DIAGNOSIS — Z86718 Personal history of other venous thrombosis and embolism: Secondary | ICD-10-CM | POA: Insufficient documentation

## 2013-11-23 DIAGNOSIS — E785 Hyperlipidemia, unspecified: Secondary | ICD-10-CM | POA: Insufficient documentation

## 2013-11-23 DIAGNOSIS — Z7902 Long term (current) use of antithrombotics/antiplatelets: Secondary | ICD-10-CM | POA: Insufficient documentation

## 2013-11-23 DIAGNOSIS — M79609 Pain in unspecified limb: Secondary | ICD-10-CM | POA: Insufficient documentation

## 2013-11-23 LAB — COMPREHENSIVE METABOLIC PANEL
ALT: 14 U/L (ref 0–35)
AST: 14 U/L (ref 0–37)
Albumin: 3.4 g/dL — ABNORMAL LOW (ref 3.5–5.2)
Alkaline Phosphatase: 92 U/L (ref 39–117)
BUN: 14 mg/dL (ref 6–23)
CALCIUM: 8.9 mg/dL (ref 8.4–10.5)
CHLORIDE: 104 meq/L (ref 96–112)
CO2: 26 meq/L (ref 19–32)
CREATININE: 1.01 mg/dL (ref 0.50–1.10)
GFR, EST AFRICAN AMERICAN: 71 mL/min — AB (ref >90)
GFR, EST NON AFRICAN AMERICAN: 61 mL/min — AB (ref >90)
GLUCOSE: 104 mg/dL — AB (ref 70–99)
Potassium: 3.9 mEq/L (ref 3.7–5.3)
Sodium: 143 mEq/L (ref 137–147)
Total Bilirubin: 0.4 mg/dL (ref 0.3–1.2)
Total Protein: 6.6 g/dL (ref 6.0–8.3)

## 2013-11-23 LAB — CBC WITH DIFFERENTIAL/PLATELET
Basophils Absolute: 0 10*3/uL (ref 0.0–0.1)
Basophils Relative: 0 % (ref 0–1)
EOS PCT: 2 % (ref 0–5)
Eosinophils Absolute: 0.2 10*3/uL (ref 0.0–0.7)
HCT: 39.7 % (ref 36.0–46.0)
HEMOGLOBIN: 13.1 g/dL (ref 12.0–15.0)
LYMPHS ABS: 3.3 10*3/uL (ref 0.7–4.0)
LYMPHS PCT: 33 % (ref 12–46)
MCH: 27.4 pg (ref 26.0–34.0)
MCHC: 33 g/dL (ref 30.0–36.0)
MCV: 83.1 fL (ref 78.0–100.0)
MONO ABS: 1.3 10*3/uL — AB (ref 0.1–1.0)
MONOS PCT: 13 % — AB (ref 3–12)
NEUTROS ABS: 5.4 10*3/uL (ref 1.7–7.7)
Neutrophils Relative %: 52 % (ref 43–77)
Platelets: 296 10*3/uL (ref 150–400)
RBC: 4.78 MIL/uL (ref 3.87–5.11)
RDW: 13.8 % (ref 11.5–15.5)
WBC: 10.2 10*3/uL (ref 4.0–10.5)

## 2013-11-23 LAB — PROTIME-INR
INR: 2.29 — AB (ref 0.00–1.49)
Prothrombin Time: 24.5 seconds — ABNORMAL HIGH (ref 11.6–15.2)

## 2013-11-23 MED ORDER — AZITHROMYCIN 250 MG PO TABS: ORAL_TABLET | ORAL | Status: DC

## 2013-11-23 MED ORDER — IOHEXOL 350 MG/ML SOLN
100.0000 mL | Freq: Once | INTRAVENOUS | Status: AC | PRN
  Administered 2013-11-23: 100 mL via INTRAVENOUS

## 2013-11-23 NOTE — ED Provider Notes (Signed)
CSN: [id number]     Arrival date & time 11/23/13  0248 History   First MD Initiated Contact with Patient 11/23/13 [phone]     Chief Complaint  Patient presents with  . Hemoptysis     (Consider location/radiation/quality/duration/timing/severity/associated sxs/prior Treatment) HPI  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 She presents after experiencing 3 back to back episodes of productive cough with blood mixed in sputum. She has had a cough for 7 to 10 days and was seen in this ED about a week ago for same - her CXR was unremarkable and she was prescribed a cough medicine and Albuterol HFA.   She says her cough has become more productive. She denies chest pain and fever. She did not pass any clotted blood. She says her main concern is for "internal bleeding from this Xarelto like they show on those lawyer commercials".   Past Medical History  Diagnosis Date  . Leg pain     right  . Hyperlipidemia   . Hypertension   . DVT (deep venous thrombosis)   . PE (pulmonary embolism)    Past Surgical History  Procedure Laterality Date  . Back surgery    . Abdominal hysterectomy    . Cholecystectomy      Gall Bladder removal  . Appendectomy    . Spine surgery      decompression surgery  . Laminotomy      right at L4-5 with a disc bulge and superimposed right lateral recess protrusion encroaching on the L5 root   Family History  Problem Relation Age of Onset  . Hypertension Other   . Heart disease Other   . Diabetes Other   . Coronary artery disease Other    History  Substance Use Topics  . Smoking status: Never Smoker   . Smokeless tobacco: Not on file  . Alcohol Use: No   OB History   Grav Para Term Preterm Abortions TAB SAB Ect Mult Living                 Review of Systems Ten point review of symptoms performed and is negative with the exception of symptoms noted above.    Allergies  Ciprofloxacin  Home  Medications   Current Outpatient Rx  Name  Route  Sig  Dispense  Refill  . albuterol (PROVENTIL HFA;VENTOLIN HFA) 108 (90 BASE) MCG/ACT inhaler   Inhalation   Inhale 2 puffs into the lungs every 2 (two) hours as needed for wheezing or shortness of breath (or coughing).   1 Inhaler   0   . amLODipine (NORVASC) 5 MG tablet   Oral   Take 5 mg by mouth daily.         . chlorpheniramine-HYDROcodone (TUSSIONEX PENNKINETIC ER) 10-8 MG/5ML LQCR   Oral   Take 5 mLs by mouth at bedtime as needed for cough.   30 mL   0   . citalopram (CELEXA) 20 MG tablet   Oral   Take 20 mg by mouth daily.         . fexofenadine-pseudoephedrine (ALLEGRA-D) 60-120 MG per tablet   Oral   Take 1 tablet by mouth 2 (two) times daily.         . montelukast (SINGULAIR) 10 MG tablet   Oral   Take 10 mg by mouth at bedtime.         Marland Kitchen. olmesartan-hydrochlorothiazide (BENICAR HCT) 40-12.5 MG per  tablet   Oral   Take 1 tablet by mouth daily.         . potassium chloride SA (K-DUR,KLOR-CON) 20 MEQ tablet   Oral   Take 20 mEq by mouth daily.         . predniSONE (DELTASONE) 50 MG tablet   Oral   Take 1 tablet (50 mg total) by mouth daily.   5 tablet   0   . rivaroxaban (XARELTO) 10 MG TABS tablet   Oral   Take 20 mg by mouth daily.          . simvastatin (ZOCOR) 40 MG tablet   Oral   Take 40 mg by mouth every evening.          BP 127/77  Pulse 96  Temp(Src) 98.1 F (36.7 C) (Oral)  Resp 18  SpO2 98% Physical Exam Gen: well developed and well nourished appearing Head: NCAT Eyes: PERL, EOMI Nose: no epistaixis or rhinorrhea Mouth/throat: mucosa is moist and pink Neck: supple, no stridor Lungs: CTA B, no wheezing, rhonchi or rales CV: regular rate and rythm, good distal pulses.  Abd: soft, B., notender, nondistended Back: no ttp, no cva ttp Skin: warm and dry Ext: no edema, normal to inspection Neuro: CN ii-xii grossly intact, no focal deficits Psyche; normal affect,   calm and cooperative.  ED Course  Procedures (including critical care time) Labs Review  Results for orders placed during the hospital encounter of 11/23/13 (from the past 24 hour(s))  CBC WITH DIFFERENTIAL     Status: Abnormal   Collection Time    11/23/13  2:57 AM      Result Value Ref Range   WBC 10.2  4.0 - 10.5 K/uL   RBC 4.78  3.87 - 5.11 MIL/uL   Hemoglobin 13.1  12.0 - 15.0 g/dL   HCT 16.1  09.6 - 04.5 %   MCV 83.1  78.0 - 100.0 fL   MCH 27.4  26.0 - 34.0 pg   MCHC 33.0  30.0 - 36.0 g/dL   RDW 40.9  81.1 - 91.4 %   Platelets 296  150 - 400 K/uL   Neutrophils Relative % 52  43 - 77 %   Neutro Abs 5.4  1.7 - 7.7 K/uL   Lymphocytes Relative 33  12 - 46 %   Lymphs Abs 3.3  0.7 - 4.0 K/uL   Monocytes Relative 13 (*) 3 - 12 %   Monocytes Absolute 1.3 (*) 0.1 - 1.0 K/uL   Eosinophils Relative 2  0 - 5 %   Eosinophils Absolute 0.2  0.0 - 0.7 K/uL   Basophils Relative 0  0 - 1 %   Basophils Absolute 0.0  0.0 - 0.1 K/uL  COMPREHENSIVE METABOLIC PANEL     Status: Abnormal   Collection Time    11/23/13  2:57 AM      Result Value Ref Range   Sodium 143  137 - 147 mEq/L   Potassium 3.9  3.7 - 5.3 mEq/L   Chloride 104  96 - 112 mEq/L   CO2 26  19 - 32 mEq/L   Glucose, Bld 104 (*) 70 - 99 mg/dL   BUN 14  6 - 23 mg/dL   Creatinine, Ser 7.82  0.50 - 1.10 mg/dL   Calcium 8.9  8.4 - 95.6 mg/dL   Total Protein 6.6  6.0 - 8.3 g/dL   Albumin 3.4 (*) 3.5 - 5.2 g/dL   AST 14  0 -  37 U/L   ALT 14  0 - 35 U/L   Alkaline Phosphatase 92  39 - 117 U/L   Total Bilirubin 0.4  0.3 - 1.2 mg/dL   GFR calc non Af Amer 61 (*) >90 mL/min   GFR calc Af Amer 71 (*) >90 mL/min  PROTIME-INR     Status: Abnormal   Collection Time    11/23/13  2:57 AM      Result Value Ref Range   Prothrombin Time 24.5 (*) 11.6 - 15.2 seconds   INR 2.29 (*) 0.00 - 1.49     Dg Chest 2 View  11/23/2013   CLINICAL DATA:  Hematemesis.  EXAM: CHEST  2 VIEW  COMPARISON:  Chest radiograph performed 11/13/2013   FINDINGS: The lungs are well-aerated. Mild left basilar opacity may reflect atelectasis or possibly mild pneumonia. This is not well characterized on the lateral view. There is no evidence of pleural effusion or pneumothorax.  The heart is normal in size; the mediastinal contour is within normal limits. No acute osseous abnormalities are seen.  IMPRESSION: Mild left basilar airspace opacity may reflect atelectasis or possibly mild pneumonia.   Electronically Signed   By: Roanna Raider M.D.   On: 11/23/2013 03:37   CT Angio Chest PE W/Cm &/Or Wo Cm (Final result)  Result time: 11/23/13 05:03:48    Final result by Rad Results In Interface (11/23/13 05:03:48)    Narrative:   CLINICAL DATA: Shortness of breath for several weeks. Hemoptysis. History of pulmonary embolus.  EXAM: CT ANGIOGRAPHY CHEST WITH CONTRAST  TECHNIQUE: Multidetector CT imaging of the chest was performed using the standard protocol during bolus administration of intravenous contrast. Multiplanar CT image reconstructions and MIPs were obtained to evaluate the vascular anatomy.  CONTRAST: OMNIPAQUE IOHEXOL 350 MG/ML SOLN  COMPARISON: CTA of the chest performed 11/22/2011, and chest radiograph performed earlier today at 3:31 a.m.  FINDINGS: There is no evidence of pulmonary embolus.  Mild focal hazy opacity at the left lung base raises question for mild focal pneumonia. Additional minimal hazy opacities at the right lung base may also reflect pneumonia. Underlying mild atelectasis is seen bilaterally. There is no evidence of pleural effusion or pneumothorax. No masses are identified; no abnormal focal contrast enhancement is seen.  Visualized mediastinal and hilar nodes remain normal in size. No pericardial effusion is identified. The great vessels are grossly unremarkable in appearance. No axillary lymphadenopathy is seen. The visualized portions of the thyroid gland are unremarkable in appearance.  The  visualized portions of the liver and spleen are unremarkable.  No acute osseous abnormalities are seen.  Review of the MIP images confirms the above findings.  IMPRESSION: 1. No evidence of pulmonary embolus. 2. Mild focal hazy opacity at the left lung base raises concern for mild focal pneumonia. Additional minimal hazy opacities at the right lung base may also reflect pneumonia. 3. Underlying mild atelectasis seen bilaterally.   Electronically Signed By: Roanna Raider M.D. On: 11/23/2013 05:03             DG Chest 2 View (Final result)  Result time: 11/23/13 03:37:23    Final result by Rad Results In Interface (11/23/13 03:37:23)    Narrative:   CLINICAL DATA: Hematemesis.  EXAM: CHEST 2 VIEW  COMPARISON: Chest radiograph performed 11/13/2013  FINDINGS: The lungs are well-aerated. Mild left basilar opacity may reflect atelectasis or possibly mild pneumonia. This is not well characterized on the lateral view. There is no evidence of pleural effusion  or pneumothorax.  The heart is normal in size; the mediastinal contour is within normal limits. No acute osseous abnormalities are seen.  IMPRESSION: Mild left basilar airspace opacity may reflect atelectasis or possibly mild pneumonia.   Electronically Signed By: Roanna Raider M.D. On: 11/23/2013 03:37       MDM  DDX: bronchitis, mass lesion, pulmonary embolus, pneumonia.   CTA chest has ruled out pulmonary embolus and confirmed pneumonia in the left base. The patient is well appearing and nontoxic appearing with normal VS. She is stable for discharge with plan for empiric tx with Azithromycin for CAP.    Brandt Loosen, MD 11/23/13 [phone]

## 2013-11-23 NOTE — ED Notes (Signed)
MD at bedside. 

## 2013-11-23 NOTE — ED Notes (Signed)
Pt states she began spitting up bright red blood about 2 days ago. Pt denies any injury, no visible trauma to mouth. Pt states she spit up blood 3xs prior to coming to ED, and a few times outside in the waiting room. Pt also c/o pain under both breast that started today. Pt rates pain 5/10.

## 2013-11-23 NOTE — ED Notes (Addendum)
Pt. reports spitting up blood onset 2 days ago , denies injury , respirations unlabored / alert and oriented . Pt. takes Xarelto .

## 2013-11-23 NOTE — Discharge Instructions (Signed)
Cough, Adult  A cough is a reflex. It helps you clear your throat and airways. A cough can help heal your body. A cough can last 2 or 3 weeks (acute) or may last more than 8 weeks (chronic). Some common causes of a cough can include an infection, allergy, or a cold. HOME CARE  Only take medicine as told by your doctor.  If given, take your medicines (antibiotics) as told. Finish them even if you start to feel better.  Use a cold steam vaporizer or humidier in your home. This can help loosen thick spit (secretions).  Sleep so you are almost sitting up (semi-upright). Use pillows to do this. This helps reduce coughing.  Rest as needed.  Stop smoking if you smoke. GET HELP RIGHT AWAY IF:  You have yellowish-white fluid (pus) in your thick spit.  Your cough gets worse.  Your medicine does not reduce coughing, and you are losing sleep.  You cough up blood.  You have trouble breathing.  Your pain gets worse and medicine does not help.  You have a fever. MAKE SURE YOU:   Understand these instructions.  Will watch your condition.  Will get help right away if you are not doing well or get worse. Document Released: 04/18/2011 Document Revised: 10/28/2011 Document Reviewed: 04/18/2011 [employer] Patient Information 2014 [employer].  

## 2014-07-04 ENCOUNTER — Encounter: Payer: Self-pay | Admitting: Cardiology

## 2014-09-13 ENCOUNTER — Emergency Department (HOSPITAL_COMMUNITY): Payer: PRIVATE HEALTH INSURANCE

## 2014-09-13 ENCOUNTER — Encounter (HOSPITAL_COMMUNITY): Payer: Self-pay | Admitting: *Deleted

## 2014-09-13 ENCOUNTER — Emergency Department (HOSPITAL_COMMUNITY)
Admission: EM | Admit: 2014-09-13 | Discharge: 2014-09-13 | Disposition: A | Discharge status: Home / Self Care | Payer: PRIVATE HEALTH INSURANCE | Attending: Emergency Medicine | Admitting: Emergency Medicine

## 2014-09-13 ENCOUNTER — Emergency Department (HOSPITAL_COMMUNITY): Payer: Self-pay

## 2014-09-13 DIAGNOSIS — R0600 Dyspnea, unspecified: Secondary | ICD-10-CM

## 2014-09-13 DIAGNOSIS — Z7901 Long term (current) use of anticoagulants: Secondary | ICD-10-CM | POA: Insufficient documentation

## 2014-09-13 DIAGNOSIS — Z86718 Personal history of other venous thrombosis and embolism: Secondary | ICD-10-CM | POA: Insufficient documentation

## 2014-09-13 DIAGNOSIS — Z79899 Other long term (current) drug therapy: Secondary | ICD-10-CM | POA: Insufficient documentation

## 2014-09-13 DIAGNOSIS — E785 Hyperlipidemia, unspecified: Secondary | ICD-10-CM | POA: Insufficient documentation

## 2014-09-13 DIAGNOSIS — Z86711 Personal history of pulmonary embolism: Secondary | ICD-10-CM | POA: Insufficient documentation

## 2014-09-13 DIAGNOSIS — I10 Essential (primary) hypertension: Secondary | ICD-10-CM | POA: Insufficient documentation

## 2014-09-13 LAB — I-STAT CHEM 8, ED
BUN: 17 mg/dL (ref 6–23)
CALCIUM ION: 1.15 mmol/L (ref 1.12–1.23)
CHLORIDE: 105 mmol/L (ref 96–112)
CREATININE: 0.8 mg/dL (ref 0.50–1.10)
Glucose, Bld: 99 mg/dL (ref 70–99)
HCT: 43 % (ref 36.0–46.0)
Hemoglobin: 14.6 g/dL (ref 12.0–15.0)
Potassium: 4.1 mmol/L (ref 3.5–5.1)
SODIUM: 141 mmol/L (ref 135–145)
TCO2: 23 mmol/L (ref 0–100)

## 2014-09-13 LAB — CBC
HCT: 40.3 % (ref 36.0–46.0)
HEMOGLOBIN: 13.7 g/dL (ref 12.0–15.0)
MCH: 27 pg (ref 26.0–34.0)
MCHC: 34 g/dL (ref 30.0–36.0)
MCV: 79.5 fL (ref 78.0–100.0)
PLATELETS: 259 10*3/uL (ref 150–400)
RBC: 5.07 MIL/uL (ref 3.87–5.11)
RDW: 13.3 % (ref 11.5–15.5)
WBC: 6.3 10*3/uL (ref 4.0–10.5)

## 2014-09-13 LAB — BASIC METABOLIC PANEL
Anion gap: 14 (ref 5–15)
BUN: 12 mg/dL (ref 6–23)
CHLORIDE: 110 mmol/L (ref 96–112)
CO2: 19 mmol/L (ref 19–32)
CREATININE: 0.96 mg/dL (ref 0.50–1.10)
Calcium: 9.9 mg/dL (ref 8.4–10.5)
GFR calc Af Amer: 75 mL/min — ABNORMAL LOW (ref >90)
GFR, EST NON AFRICAN AMERICAN: 65 mL/min — AB (ref >90)
GLUCOSE: 85 mg/dL (ref 70–99)
POTASSIUM: 3.5 mmol/L (ref 3.5–5.1)
Sodium: 143 mmol/L (ref 135–145)

## 2014-09-13 LAB — I-STAT TROPONIN, ED: TROPONIN I, POC: 0 ng/mL (ref 0.00–0.08)

## 2014-09-13 LAB — BRAIN NATRIURETIC PEPTIDE: B NATRIURETIC PEPTIDE 5: 17.3 pg/mL (ref 0.0–100.0)

## 2014-09-13 MED ORDER — IOHEXOL 350 MG/ML SOLN
80.0000 mL | Freq: Once | INTRAVENOUS | Status: AC | PRN
  Administered 2014-09-13: 80 mL via INTRAVENOUS

## 2014-09-13 MED ORDER — ALBUTEROL SULFATE HFA 108 (90 BASE) MCG/ACT IN AERS: 1.0000 | INHALATION_SPRAY | Freq: Four times a day (QID) | RESPIRATORY_TRACT | Status: DC | PRN

## 2014-09-13 NOTE — ED Notes (Signed)
Patient transported to CT 

## 2014-09-13 NOTE — ED Notes (Signed)
Pt reports sob that started last night, feels similar to PE in past. spo2 100% and ekg done.

## 2014-09-13 NOTE — ED Provider Notes (Signed)
CSN: [id number]     Arrival date & time 09/13/14  1037 History   First MD Initiated Contact with Patient 09/13/14 1116     Chief Complaint  Patient presents with  . Shortness of Breath     (Consider location/radiation/quality/duration/timing/severity/associated sxs/prior Treatment) HPI Comments: 59 year-old female with history of obesity, lipids, high blood pressure, pulmonary embolism, on Xarelto, patient has felt persistent shortness of breath since last night feeling similar to last pulmonary M wasn't history. Patient is been taking her medicines as directed. Patient was on Coumadin in the past and changed for convenience reasons. Patient has been on Xarelto for many months. No chest pain, no cardiac history. No recent surgery or leg swelling.  Patient is a 59 y.o. female presenting with shortness of breath. The history is provided by the patient.  Shortness of Breath Associated symptoms: no abdominal pain, no chest pain, no cough, no fever, no headaches, no neck pain, no rash and no vomiting     Past Medical History  Diagnosis Date  . Leg pain     right  . Hyperlipidemia   . Hypertension   . DVT (deep venous thrombosis)   . PE (pulmonary embolism)    Past Surgical History  Procedure Laterality Date  . Back surgery    . Abdominal hysterectomy    . Cholecystectomy      Gall Bladder removal  . Appendectomy    . Spine surgery      decompression surgery  . Laminotomy      right at L4-5 with a disc bulge and superimposed right lateral recess protrusion encroaching on the L5 root   Family History  Problem Relation Age of Onset  . Hypertension Other   . Heart disease Other   . Diabetes Other   . Coronary artery disease Other    History  Substance Use Topics  . Smoking status: Never Smoker   . Smokeless tobacco: Not on file  . Alcohol Use: No   OB History    No data available     Review of Systems  Constitutional: Negative for fever and chills.  HENT: Negative for  congestion.   Eyes: Negative for visual disturbance.  Respiratory: Positive for shortness of breath. Negative for cough.   Cardiovascular: Negative for chest pain.  Gastrointestinal: Negative for vomiting and abdominal pain.  Genitourinary: Negative for dysuria and flank pain.  Musculoskeletal: Negative for back pain, neck pain and neck stiffness.  Skin: Negative for rash.  Neurological: Negative for light-headedness and headaches.      Allergies  Ciprofloxacin  Home Medications   Prior to Admission medications   Medication Sig Start Date End Date Taking? Authorizing Provider  amLODipine (NORVASC) 5 MG tablet Take 5 mg by mouth daily.   Yes Historical Provider, MD  montelukast (SINGULAIR) 10 MG tablet Take 10 mg by mouth at bedtime.   Yes Historical Provider, MD  olmesartan-hydrochlorothiazide (BENICAR HCT) 40-12.5 MG per tablet Take 1 tablet by mouth daily.   Yes Historical Provider, MD  potassium chloride SA (K-DUR,KLOR-CON) 20 MEQ tablet Take 20 mEq by mouth daily.   Yes Historical Provider, MD  simvastatin (ZOCOR) 40 MG tablet Take 40 mg by mouth every evening.   Yes Historical Provider, MD  XARELTO 20 MG TABS tablet Take 20 mg by mouth every evening. 08/26/14  Yes Historical Provider, MD  albuterol (PROVENTIL HFA;VENTOLIN HFA) 108 (90 BASE) MCG/ACT inhaler Inhale 2 puffs into the lungs every 2 (two) hours as needed for wheezing  or shortness of breath (or coughing). 11/13/13   Dione Boozeavid Glick, MD  albuterol (PROVENTIL HFA;VENTOLIN HFA) 108 (90 BASE) MCG/ACT inhaler Inhale 1-2 puffs into the lungs every 6 (six) hours as needed for wheezing or shortness of breath. 09/13/14   Enid SkeensJoshua M Jae Skeet, MD  azithromycin (ZITHROMAX) 250 MG tablet Take two tabs on day #1 followed by 1 tab per day for the next 4 days. 11/23/13   Brandt LoosenJulie Manly, MD  rivaroxaban (XARELTO) 10 MG TABS tablet Take 10 mg by mouth daily.     Historical Provider, MD   BP 117/60 mmHg  Pulse 64  Temp(Src) 98.4 F (36.9 C) (Oral)   Resp 11  SpO2 98% Physical Exam  Constitutional: She is oriented to person, place, and time. She appears well-developed and well-nourished.  HENT:  Head: Normocephalic and atraumatic.  Eyes: Conjunctivae are normal. Right eye exhibits no discharge. Left eye exhibits no discharge.  Neck: Normal range of motion. Neck supple. No tracheal deviation present.  Cardiovascular: Normal rate and regular rhythm.   Pulmonary/Chest: Effort normal and breath sounds normal.  Abdominal: Soft. She exhibits no distension. There is no tenderness. There is no guarding.  Musculoskeletal: She exhibits no edema.  Neurological: She is alert and oriented to person, place, and time.  Skin: Skin is warm. No rash noted.  Psychiatric: She has a normal mood and affect.  Nursing note and vitals reviewed.   ED Course  Procedures (including critical care time) Labs Review Labs Reviewed  BASIC METABOLIC PANEL - Abnormal; Notable for the following:    GFR calc non Af Amer 65 (*)    GFR calc Af Amer 75 (*)    All other components within normal limits  CBC  BRAIN NATRIURETIC PEPTIDE  I-STAT TROPOININ, ED  I-STAT CHEM 8, ED    Imaging Review Dg Chest 2 View  09/13/2014   CLINICAL DATA:  Shortness of breath  EXAM: CHEST  2 VIEW  COMPARISON:  CT chest 11/23/2013  FINDINGS: The heart size and mediastinal contours are within normal limits. Both lungs are clear. The visualized skeletal structures are unremarkable.  IMPRESSION: No active cardiopulmonary disease.   Electronically Signed   By: Elige KoHetal  Patel   On: 09/13/2014 12:17   Ct Angio Chest Pe W/cm &/or Wo Cm  09/13/2014   CLINICAL DATA:  Shortness of breath and history of pulmonary embolism.  EXAM: CT ANGIOGRAPHY CHEST WITH CONTRAST  TECHNIQUE: Multidetector CT imaging of the chest was performed using the standard protocol during bolus administration of intravenous contrast. Multiplanar CT image reconstructions and MIPs were obtained to evaluate the vascular anatomy.   CONTRAST:  80mL OMNIPAQUE IOHEXOL 350 MG/ML SOLN  COMPARISON:  11/23/2013  FINDINGS: The pulmonary arteries are well opacified on the study. There is no evidence of pulmonary embolism. The thoracic aorta is also fairly well opacified and shows no evidence of aneurysmal disease or dissection.  There is no evidence of pulmonary edema, consolidation, pneumothorax, nodule or pleural fluid. Stable parenchymal scarring is present bilaterally. No evidence of lymphadenopathy. The heart size is normal and there is no evidence of pericardial fluid. No airway obstruction.  The visualized upper abdomen is unremarkable. Bony structures show osteophyte formation and mild spondylosis of the thoracic spine.  Review of the MIP images confirms the above findings.  IMPRESSION: No evidence of pulmonary embolism or other acute findings in the chest.   Electronically Signed   By: Irish LackGlenn  Yamagata M.D.   On: 09/13/2014 13:53     EKG  Interpretation   Date/Time:  Tuesday September 13 2014 10:41:26 EST Ventricular Rate:  80 PR Interval:  138 QRS Duration: 68 QT Interval:  420 QTC Calculation: 484 R Axis:   80 Text Interpretation:  Normal sinus rhythm Nonspecific T wave abnormality  Prolonged QT Abnormal ECG poor baseline overall similar Confirmed by  Nylen Creque  MD, Alois Mincer ([id number]) on 09/13/2014 11:32:17 AM      MDM   Final diagnoses:  Dyspnea    Patient presents with shortness of breath similar to poor M wasn't history. Patient is on maximal medical therapy with Xarelto and has a filter in place has been taking her medicines. Patient is vitals are normal the ER no increased work of breathing. I discussed that CT scan is unlikely to change management and last showing significant signs of heart strain. Patient wishes to proceed for CT scan knowing the risk of radiation and IV dye to clarify the reason for shortness of breath. Patient also having cardiac screen in the ER. EKG reviewed no acute findings.  CT scan results  reviewed no acute PE, no acute process. Troponin negative, patient improved on reassessment. Plan for close follow-up outpatient for further testing possibly lung function testing. Results and differential diagnosis were discussed with the patient/parent/guardian. Close follow up outpatient was discussed, comfortable with the plan.   Medications  iohexol (OMNIPAQUE) 350 MG/ML injection 80 mL (80 mLs Intravenous Contrast Given 09/13/14 1322)    Filed Vitals:   09/13/14 1400 09/13/14 1415 09/13/14 1427 09/13/14 1430  BP: 121/75 127/62 127/62 117/60  Pulse: 72 63 65 64  Temp:      TempSrc:      Resp: SpO2: 100% 100% 95% 98%    Final diagnoses:  Dyspnea       Enid Skeens, MD 09/13/14 1505

## 2014-09-13 NOTE — Discharge Instructions (Signed)
If you were given medicines take as directed.  If you are on coumadin or contraceptives realize their levels and effectiveness is altered by many different medicines.  If you have any reaction (rash, tongues swelling, other) to the medicines stop taking and see a physician.   Please follow up as directed and return to the ER or see a physician for new or worsening symptoms.  Thank you. Filed Vitals:   09/13/14 1245 09/13/14 1300 09/13/14 1345 09/13/14 1400  BP: 110/65 114/68 122/50 121/75  Pulse: 59 61 73 72  Temp:      TempSrc:      Resp: 14 15 18 14   SpO2: 99% 99% 100% 100%

## 2016-03-21 ENCOUNTER — Ambulatory Visit: Payer: PRIVATE HEALTH INSURANCE

## 2016-04-12 ENCOUNTER — Ambulatory Visit (INDEPENDENT_AMBULATORY_CARE_PROVIDER_SITE_OTHER): Payer: Self-pay | Admitting: Internal Medicine

## 2016-04-12 ENCOUNTER — Encounter: Payer: Self-pay | Admitting: Internal Medicine

## 2016-04-12 VITALS — BP 139/82 | HR 64 | Temp 97.6°F | Ht 63.0 in | Wt 217.8 lb

## 2016-04-12 DIAGNOSIS — Z79899 Other long term (current) drug therapy: Secondary | ICD-10-CM

## 2016-04-12 DIAGNOSIS — Z86711 Personal history of pulmonary embolism: Secondary | ICD-10-CM

## 2016-04-12 DIAGNOSIS — Z0000 Encounter for general adult medical examination without abnormal findings: Secondary | ICD-10-CM

## 2016-04-12 DIAGNOSIS — Z7901 Long term (current) use of anticoagulants: Secondary | ICD-10-CM

## 2016-04-12 DIAGNOSIS — J309 Allergic rhinitis, unspecified: Secondary | ICD-10-CM | POA: Insufficient documentation

## 2016-04-12 DIAGNOSIS — E785 Hyperlipidemia, unspecified: Secondary | ICD-10-CM

## 2016-04-12 DIAGNOSIS — I10 Essential (primary) hypertension: Secondary | ICD-10-CM

## 2016-04-12 MED ORDER — MONTELUKAST SODIUM 10 MG PO TABS: 10.0000 mg | ORAL_TABLET | Freq: Every day | ORAL | 3 refills | Status: DC

## 2016-04-12 MED ORDER — OLMESARTAN MEDOXOMIL-HCTZ 40-12.5 MG PO TABS: 1.0000 | ORAL_TABLET | Freq: Every day | ORAL | 3 refills | Status: DC

## 2016-04-12 MED ORDER — SIMVASTATIN 40 MG PO TABS: 40.0000 mg | ORAL_TABLET | Freq: Every day | ORAL | 3 refills | Status: DC

## 2016-04-12 MED ORDER — RIVAROXABAN 20 MG PO TABS: 20.0000 mg | ORAL_TABLET | Freq: Every day | ORAL | 3 refills | Status: DC

## 2016-04-12 MED ORDER — POTASSIUM CHLORIDE ER 20 MEQ PO TBCR: 20.0000 meq | EXTENDED_RELEASE_TABLET | Freq: Every day | ORAL | 3 refills | Status: DC

## 2016-04-12 MED ORDER — AMLODIPINE BESYLATE 5 MG PO TABS: 5.0000 mg | ORAL_TABLET | Freq: Every day | ORAL | 3 refills | Status: DC

## 2016-04-12 NOTE — Assessment & Plan Note (Signed)
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 It is unclear if these events were provoked or unprovoked. She denies a family history of blood clots or bleeding disorders. She denies a personal history of miscarriages. She is on chronic anticoagulation with Xarelto 20 mg daily and denies any bleeding issues.   Plan: -Repeat BMET and CBC -Continue Xarelto 20 mg daily

## 2016-04-12 NOTE — Assessment & Plan Note (Signed)
Last lipid panel from 2010 shows Lipid Panel     Component Value Date/Time   CHOL (H) 01/19/2009 0227    227        ATP III CLASSIFICATION:  <200     mg/dL   Desirable  132-440200-239  mg/dL   Borderline High  >=102>=240    mg/dL   High          TRIG [id number] 01/19/2009 0227   HDL 42 01/19/2009 0227   CHOLHDL 5.4 01/19/2009 0227   VLDL 21 01/19/2009 0227   LDLCALC (H) 01/19/2009 0227    164        Total Cholesterol/HDL:CHD Risk Coronary Heart Disease Risk Table                     Men   Women  1/2 Average Risk   3.4   3.3  Average Risk       5.0   4.4  2 X Average Risk   9.6   7.1  3 X Average Risk  23.4   11.0        Use the calculated Patient Ratio above and the CHD Risk Table to determine the patient's CHD Risk.        ATP III CLASSIFICATION (LDL):  <100     mg/dL   Optimal  366-440100-129  mg/dL   Near or Above                    Optimal  130-159  mg/dL   Borderline  [id number]  mg/dL   High  >956>190     mg/dL   Very High   Patient reports compliance with simvastatin 40 mg daily.   Plan: -Continue simvastatin 40 mg daily -Repeat lipid panel

## 2016-04-12 NOTE — Assessment & Plan Note (Signed)
Patient reports compliance and symptom control with Montelukast 10 mg daily.   Plan: -Continue montelukast 10 mg daily

## 2016-04-12 NOTE — Progress Notes (Signed)
    CC: HTN  HPI: [name] is a 60 y.o. female with PMHx of HTN, Recurrent DVT/PE and HLD and who presents to the clinic for establishing care and treatment of HTN and HLD.   Patient is eating and drinking well. She denies any chest pain, shortness of breath, or unexpected weight loss.  Past Medical History:  Diagnosis Date  . DVT (deep venous thrombosis) (HCC)   . Hyperlipidemia   . Hypertension   . Leg pain    right  . PE (pulmonary embolism)    Review of Systems: Please see pertinent ROS reviewed in HPI and problem based charting.   Physical Exam: Vitals:   04/12/16 1503  BP: 139/82  Pulse: 64  Temp: 97.6 F (36.4 C)  TempSrc: Oral  SpO2: 98%  Weight: 217 lb 12.8 oz (98.8 kg)  Height: 5\' 3"  (1.6 m)   General: Vital signs reviewed.  Patient is well-developed and well-nourished, in no acute distress and cooperative with exam.  Neck: Supple, trachea midline, no carotid bruit present.  Cardiovascular: RRR, S1 normal, S2 normal, no murmurs, gallops, or rubs. Pulmonary/Chest: Clear to auscultation bilaterally, no wheezes, rales, or rhonchi. Abdominal: Soft, non-tender, non-distended, BS + Extremities: 1+ non-pitting lower extremity edema bilaterally to ankles, pulses symmetric and intact bilaterally.  Skin: Multiple skin tags around neck. Psychiatric: Normal mood and affect. speech and behavior is normal. Cognition and memory are normal.   Assessment & Plan:  See encounters tab for problem based medical decision making. Patient discussed with Dr. Rogelia BogaButcher

## 2016-04-12 NOTE — Assessment & Plan Note (Addendum)
BP Readings from Last 3 Encounters:  04/12/16 139/82  09/13/14 117/60  11/23/13 115/61    Lab Results  Component Value Date   NA 143 09/13/2014   K 3.5 09/13/2014   CREATININE 0.96 09/13/2014    Assessment: Blood pressure control:  Controlled, but borderline Progress toward BP goal:   Stable Comments: Compliant with amlodipine 5 mg daily and olmesartan-HCTZ 40-12.5 mg daily.   Plan: Medications: Continue current medications Other plans: Repeat BMET. Follow up in 6 months

## 2016-04-12 NOTE — Assessment & Plan Note (Signed)
Per patient Self-Report: -Colon Cancer Screening: Colonoscopy N 2009; repeat 2019 -Pap Smear: N 09/2015; repeat 2020 -Mammogram: N 09/2015; repeat 2018 -Declined flu shot -Needs Pneumococcal vaccination

## 2016-04-12 NOTE — Patient Instructions (Signed)
CONTINUE ALL MEDICATIONS AS PRESCRIBED.  PLEASE FOLLOW UP IN 6 MONTHS.

## 2016-04-13 LAB — BMP8+ANION GAP
ANION GAP: 17 mmol/L (ref 10.0–18.0)
BUN/Creatinine Ratio: 15 (ref 9–23)
BUN: 12 mg/dL (ref 6–24)
CALCIUM: 9.5 mg/dL (ref 8.7–10.2)
CO2: 24 mmol/L (ref 18–29)
Chloride: 102 mmol/L (ref 96–106)
Creatinine, Ser: 0.8 mg/dL (ref 0.57–1.00)
GFR, EST AFRICAN AMERICAN: 94 mL/min/{1.73_m2} (ref >59)
GFR, EST NON AFRICAN AMERICAN: 82 mL/min/{1.73_m2} (ref >59)
Glucose: 86 mg/dL (ref 65–99)
Potassium: 4.3 mmol/L (ref 3.5–5.2)
Sodium: 143 mmol/L (ref 134–144)

## 2016-04-13 LAB — CBC
HEMATOCRIT: 42.4 % (ref 34.0–46.6)
HEMOGLOBIN: 13.8 g/dL (ref 11.1–15.9)
MCH: 26.6 pg (ref 26.6–33.0)
MCHC: 32.5 g/dL (ref 31.5–35.7)
MCV: 82 fL (ref 79–97)
Platelets: 283 10*3/uL (ref 150–379)
RBC: 5.18 x10E6/uL (ref 3.77–5.28)
RDW: 14.1 % (ref 12.3–15.4)
WBC: 7.3 10*3/uL (ref 3.4–10.8)

## 2016-04-13 LAB — LIPID PANEL
CHOL/HDL RATIO: 2.7 ratio (ref 0.0–4.4)
CHOLESTEROL TOTAL: 126 mg/dL (ref 100–199)
HDL: 46 mg/dL (ref >39)
LDL CALC: 66 mg/dL (ref 0–99)
Triglycerides: 68 mg/dL (ref 0–149)
VLDL Cholesterol Cal: 14 mg/dL (ref 5–40)

## 2016-04-15 ENCOUNTER — Encounter: Payer: Self-pay | Admitting: Internal Medicine

## 2016-04-15 NOTE — Progress Notes (Signed)
Internal Medicine Clinic Attending  Case discussed with Dr. Burns at the time of the visit.  We reviewed the resident's history and exam and pertinent patient test results.  I agree with the assessment, diagnosis, and plan of care documented in the resident's note.  

## 2016-04-30 ENCOUNTER — Other Ambulatory Visit: Payer: Self-pay | Admitting: Internal Medicine

## 2016-07-30 ENCOUNTER — Ambulatory Visit (INDEPENDENT_AMBULATORY_CARE_PROVIDER_SITE_OTHER): Payer: Self-pay | Admitting: Pulmonary Disease

## 2016-07-30 ENCOUNTER — Encounter (INDEPENDENT_AMBULATORY_CARE_PROVIDER_SITE_OTHER): Payer: Self-pay

## 2016-07-30 VITALS — BP 118/55 | HR 72 | Temp 98.0°F | Ht 63.0 in | Wt 213.3 lb

## 2016-07-30 DIAGNOSIS — Z111 Encounter for screening for respiratory tuberculosis: Secondary | ICD-10-CM

## 2016-07-30 DIAGNOSIS — Z90710 Acquired absence of both cervix and uterus: Secondary | ICD-10-CM

## 2016-07-30 DIAGNOSIS — Z833 Family history of diabetes mellitus: Secondary | ICD-10-CM

## 2016-07-30 DIAGNOSIS — M25511 Pain in right shoulder: Secondary | ICD-10-CM

## 2016-07-30 DIAGNOSIS — R32 Unspecified urinary incontinence: Secondary | ICD-10-CM

## 2016-07-30 DIAGNOSIS — R350 Frequency of micturition: Secondary | ICD-10-CM

## 2016-07-30 DIAGNOSIS — Z0000 Encounter for general adult medical examination without abnormal findings: Secondary | ICD-10-CM

## 2016-07-30 DIAGNOSIS — M542 Cervicalgia: Secondary | ICD-10-CM

## 2016-07-30 DIAGNOSIS — I10 Essential (primary) hypertension: Secondary | ICD-10-CM

## 2016-07-30 DIAGNOSIS — M25512 Pain in left shoulder: Secondary | ICD-10-CM

## 2016-07-30 DIAGNOSIS — S161XXA Strain of muscle, fascia and tendon at neck level, initial encounter: Secondary | ICD-10-CM

## 2016-07-30 MED ORDER — CYCLOBENZAPRINE HCL 5 MG PO TABS: 5.0000 mg | ORAL_TABLET | Freq: Three times a day (TID) | ORAL | 0 refills | Status: DC | PRN

## 2016-07-30 MED ORDER — OLMESARTAN MEDOXOMIL 40 MG PO TABS: 40.0000 mg | ORAL_TABLET | Freq: Every day | ORAL | 2 refills | Status: DC

## 2016-07-30 MED ORDER — NAPROXEN 500 MG PO TABS: 500.0000 mg | ORAL_TABLET | Freq: Two times a day (BID) | ORAL | 0 refills | Status: DC

## 2016-07-30 NOTE — Patient Instructions (Addendum)
Stop taking olmesartan-hydrochlorothiazide. Take olmesartan by itself instead. Follow up in 6-8 weeks with your PCP   Cervical muscle strain can occur when there is an injury to the muscles of the neck, causing spasm of the cervical and upper back muscles. Cervical strain may result from the physical stresses of everyday life, including poor posture, muscle tension from psychologic stress, or poor sleeping habits.  NECK PAIN TREATMENTS - In most cases, neck pain can be treated conservatively with over-the-counter pain medications, ice, heat and massage, and strengthening and/or stretching exercises at home. If pain does not improve after a few weeks of conservative treatment, further evaluation is usually recommended.  Pain relief - Taken naproxen twice a day for the next two weeks then as needed. You may keep taking over the counter Tylenol between your naproxen doses. You can also take the Flexeril as needed for the muscle spasm.   Ice - For some people, ice can reduce the severity of neck pain. It can be applied directly to the sore area of the neck. Ice can be frozen in a paper cup, and then the upper edge of the cup can be torn away. The ice should be moved continuously in strokes on the neck muscles for five to seven minutes.  To control sudden onset muscle tightness, place a bag of ice, bag of frozen peas, or a frozen towel wrapped in a dry towel, on the painful area. The ice should be left in place for 15 to 20 minutes to deeply penetrate the tissues; this can be repeated every two to four hours until symptoms improve.  The skin can become injured with excessive use of ice, especially in those with poor skin sensation. The skin should be inspected with each ice application for changes in pigmentation (eg, lighter or darker colored skin) and any changes should be reported to a healthcare provider.  Heat - Heat can help to reduce pain in the neck muscles. Moist heat can be applied for 10 to 15  minutes in a shower, hot bath, or with a moist towel warmed in a microwave. However, acute injuries should utilize ice as the initial treatment. Heat may be used initially for patients who have cold intolerance to ice.  It is important to avoid overheating the towel and potentially injuring the skin, especially in people with poor skin sensation. The skin can become pigmented (discolored) in a blotchy pattern in people who use heat frequently.  Massage - Massage can be helpful for relieving muscle spasm and can be performed after heating or icing the neck. Massage can be done with the hands by applying pressure to both sides of the neck and the upper back muscles, or with an electric hand-held vibrator. The neck muscles should be relaxed during massage by supporting the head or lying down.  Stretching exercises - The range of motion of the neck must be restored and preserved after an injury; this is done with exercises that stretch and strengthen the neck muscles. Range of motion exercises and stretching may help decrease pain from muscle injury. It is best to perform stretching exercises when the muscles are warm, such as after the application of heat, or after a few minutes of cardiovascular warm-up exercises.  Exercises can be performed in the morning to relieve stiffness and again at night before going to bed. Expect mild, achy muscle pain; sharp or electric pain in the shoulder or arm is not normal and should be reported to a healthcare provider.  Do not attempt to perform these exercises if you have a pinched nerve in the neck, especially if there is pain or numbness into the arm and hand, unless recommended by a healthcare provider. While these exercises can dramatically improve symptoms of a pinched nerve, they can actually make the problem worse if performed improperly.  The most useful stretching exercises for the neck include the following:  1. Neck bending - Tilt the head forward and try to  touch your chin to your neck. Hold for a few seconds, breathe in gradually, and exhale slowly with each exercise. Exhaling with the movement helps relax the muscles. Repeat 10 to 15 times. Relax the neck and back muscles with each neck bend.  2. Shoulder rolls - In the sitting or standing position, hold the arms at the side with the elbows bent. Try to pinch the shoulder blades together. Roll the shoulders backwards 10 to 15 times, moving in a rhythmic, rowing motion. Rest. Roll the shoulders forwards 10 to 15 times.  Other exercise may include neck rotation, neck tilting, vertical shoulder stretches, upper back stretches, and back bending.  3. Neck rotation - Slowly look to the right. Hold for a few seconds. Look to the center. Rest for a few seconds between movements. Repeat 10 to 15 times. Perform on the left side.  4. Neck tilting - Look straight forward, then tilt the top of the head to the right, trying to touch your right ear to the right shoulder (without moving the shoulder). Hold in place for a few seconds. Return the head to the center. Repeat 10 to 15 times. Repeat on the left side.  5. Vertical shoulder stretches - In the sitting or standing position, use the right hand to hold the left wrist and pull the arm (and shoulder) up and over the head, towards the right. Hold for five seconds. Keep the left shoulder and back muscles relaxed. Rest and repeat 10 to 15 times. Repeat using left hand to hold right wrist.  6. Upper back stretches - In the standing position, lean forward from the hips and rest both hands on a low counter with the elbows straight. Exhale, relax the neck and shoulders, and allow the head to fall forward as you round the upper back. This requires the shoulder blades to spread apart and mimics the motion of a cat stretching its back. Exhaling with the motion helps to relax the muscles. Return to the standing position with hands on a counter. Repeat slowly 10 times.  7.  Upper back side bends - Stand or sit up straight in a chair. Bend the trunk to the right while holding the hands together slightly behind the neck for support. Hold for five seconds, and then return to center. Repeat to the left. Repeat 10 to 15 times. Keep the lower back straight or supported against a chair.  Posture - Activities and body positions that prevent or reduce neck pain include those that emphasize a neutral neck position and minimize tension across the supporting muscles and ligaments of the neck. Extremes of range of motion, activities, and body positions that cause constant tension should be minimized or avoided:   Avoid sitting in the same position for prolonged periods of time. Take periodic five minute breaks from the desk, work station, etc. Avoid looking up or down at a computer monitor; adjust it to eye level.  Avoid placing pressure over the upper back with backpacks, over-the-shoulder purses, or children riding on your shoulders.  Alternatives may include wheeled backpacks or cases, handbags, and having a child walk or ride in a stroller.  Do not perform overhead work for prolonged periods at a time.  Maintain good posture by holding your head up and keeping your shoulders back and down.  Use the car or chair arm rests to keep the arms supported.  Sleep with your neck in a neutral position by sleeping with a small pillow under the nape of your neck (sleeping on your back) or sleeping with enough pillows to keep your neck straight in line with your body (sleeping on your side). If you sleep on your back, putting a pillow under the knees can help to flatten the spine and relax the neck muscles. Avoid sleeping on the stomach with the head turned.  Carry heavy objects close to your body rather than with outstretched arms.

## 2016-07-30 NOTE — Progress Notes (Signed)
   CC: neck, shoulder pain  HPI:  [name] is a 60 y.o. 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  She has neck pain that extends down both shoulders. Started two weeks ago. She has been taking Tylenol. She takes 2 tablets of Tylenol a day. No previous trauma. 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 No heavy lifting. She sits on her bed and reads her phone or tablet a lot. She has never had pain like this before. It is a throbbing pain. Does not radiate anywhere else. It is constant. No paresthesias in arms. No arm weakness. Sometimes wakes her up in the middle of the night. She has tried switching pillows.   She has been urinating more frequently. No recent changes in her medicine. She has urges to urinate after urinating the first time. She woke up with urinary incontinence. No urinating with sneezing or laughing. No history of fibroids. No dysuria. No hematuria. She drinks mountain dew and dr Reino KentPepper - one a day. She feels more thirsty. Grandmother had diabetes. She had two children that are adults now - vaginal births. She has had a hysterectomy.  Past Medical History:  Diagnosis Date  . DVT (deep venous thrombosis) (HCC)   . Hyperlipidemia   . Hypertension   . Leg pain    right  . PE (pulmonary embolism)     Review of Systems:   No fevers or chills No dyspnea  Physical Exam:  Vitals:   07/30/16 0906  BP: (!) 118/55  Pulse: 72  Temp: 98 F (36.7 C)  TempSrc: Oral  SpO2: 100%  Weight: 213 lb 4.8 oz (96.8 kg)  Height: 5\' 3"  (1.6 m)   General Apperance: NAD HEENT: Normocephalic, atraumatic, anicteric sclera Neck: Supple, trachea midline Lungs: Clear to auscultation bilaterally. No wheezes, rhonchi or rales. Breathing comfortably Heart: Regular rate and rhythm, no murmur/rub/gallop Abdomen: Soft, nontender, nondistended, no rebound/guarding Extremities: Warm and well perfused,  no edema. Tenderness to palpation along trapezius muscle and cervical paraspinal muscles. No bony tenderness along cervical spine. Skin: No rashes or lesions Neurologic: Alert and interactive. No gross deficits.   Assessment & Plan:   See Encounters Tab for problem based charting.  Patient discussed with Dr. Oswaldo DoneVincent

## 2016-07-31 DIAGNOSIS — S161XXA Strain of muscle, fascia and tendon at neck level, initial encounter: Secondary | ICD-10-CM | POA: Insufficient documentation

## 2016-07-31 DIAGNOSIS — R350 Frequency of micturition: Secondary | ICD-10-CM | POA: Insufficient documentation

## 2016-07-31 LAB — URINALYSIS, ROUTINE W REFLEX MICROSCOPIC
Bilirubin, UA: NEGATIVE
Glucose, UA: NEGATIVE
KETONES UA: NEGATIVE
LEUKOCYTES UA: NEGATIVE
Nitrite, UA: NEGATIVE
PH UA: 5 (ref 5.0–7.5)
Protein, UA: NEGATIVE
RBC, UA: NEGATIVE
SPEC GRAV UA: 1.021 (ref 1.005–1.030)
Urobilinogen, Ur: 0.2 mg/dL (ref 0.2–1.0)

## 2016-07-31 NOTE — Assessment & Plan Note (Signed)
Needs TB skin test for work. Administered.

## 2016-07-31 NOTE — Assessment & Plan Note (Signed)
Assessment: She has urinary frequency, urgency and incontinence. Unlikely to be stress incontinence given not elicited by increased intraabdominal muscles. Possible urgency incontinence by history. Unlikely to be overflow incontinence due to urinary outlet obstruction given her post void residual is 80ml and history of hysterectomy. UA negative for infection. Random glucoses reassuring and no glucose on UA making diabetes unlikely.   Plan:  Discontinue diuretic for now (HCTZ). Kegel exercises May consider antimuscarinic if above does not improve her symptoms May consider urodynamic studies if above does not improve her symptoms

## 2016-07-31 NOTE — Assessment & Plan Note (Signed)
Assessment: Pain consistent with cervical strain with pain along trapezius muscle. Worse with extension of neck. Negative Spurling's. No arm weakness or paresthesias.  Plan:  Naprosyn 500mg  BID Flexeril 5mg  TID prn Stretching exercises, heat/ice instructions given Return precautions discussed

## 2016-07-31 NOTE — Assessment & Plan Note (Signed)
Assessment: BP at goal today 118/55  Plan:  Continue amlodipien 5mg  daily.  Change omesartan-HCTZ to olmesartan 40mg  daily Follow up in 1 month for BP recheck

## 2016-07-31 NOTE — Progress Notes (Signed)
Internal Medicine Clinic Attending  Case discussed with Dr. Krall at the time of the visit.  We reviewed the resident's history and exam and pertinent patient test results.  I agree with the assessment, diagnosis, and plan of care documented in the resident's note.  

## 2016-08-02 LAB — TB SKIN TEST
Induration: 0 mm
TB Skin Test: NEGATIVE

## 2016-10-07 ENCOUNTER — Other Ambulatory Visit (HOSPITAL_COMMUNITY)
Admission: RE | Admit: 2016-10-07 | Discharge: 2016-10-07 | Disposition: A | Discharge status: Home / Self Care | Payer: Self-pay | Source: Ambulatory Visit | Attending: Internal Medicine | Admitting: Internal Medicine

## 2016-10-07 ENCOUNTER — Ambulatory Visit (INDEPENDENT_AMBULATORY_CARE_PROVIDER_SITE_OTHER): Payer: Self-pay | Admitting: Internal Medicine

## 2016-10-07 ENCOUNTER — Encounter: Payer: Self-pay | Admitting: Internal Medicine

## 2016-10-07 VITALS — BP 124/51 | HR 79 | Temp 98.4°F | Wt 218.1 lb

## 2016-10-07 DIAGNOSIS — Z01419 Encounter for gynecological examination (general) (routine) without abnormal findings: Secondary | ICD-10-CM | POA: Insufficient documentation

## 2016-10-07 DIAGNOSIS — I10 Essential (primary) hypertension: Secondary | ICD-10-CM

## 2016-10-07 DIAGNOSIS — Z79899 Other long term (current) drug therapy: Secondary | ICD-10-CM

## 2016-10-07 NOTE — Progress Notes (Signed)
    CC: Pap smear  HPI: [name] is a 61 y.o. female with PMHx of hyperlipidemia, hypertension who presents to the clinic for Pap smear.   Patient denies any abnormal vaginal discharge, vaginal itching, dysuria, painful lesions. Her last Pap smear was reportedly normal in February 2017. She denies history of abnormal Pap smears.  Past Medical History:  Diagnosis Date  . DVT (deep venous thrombosis) (HCC)   . Hyperlipidemia   . Hypertension   . Leg pain    right  . PE (pulmonary embolism)      Review of Systems: Please see pertinent ROS reviewed in HPI and problem based charting.   Physical Exam: Vitals:   10/07/16 0846  BP: (!) 124/51  Pulse: 79  Temp: 98.4 F (36.9 C)  TempSrc: Oral  SpO2: 96%  Weight: 218 lb 1.6 oz (98.9 kg)   General: Vital signs reviewed.  Patient is well-developed and well-nourished, in no acute distress and cooperative with exam.  Pelvic exam: VULVA: normal appearing vulva with no masses, tenderness or lesions, VAGINA: normal appearing vagina with normal color and discharge, no lesions, CERVIX: normal appearing cervix without discharge or lesions, PAP: Pap smear done today, exam chaperoned by Claris CheMargaret, RN. Extremities: No lower extremity edema bilaterally  Skin: Warm, dry and intact. No rashes or erythema. Psychiatric: Normal mood and affect. speech and behavior is normal. Cognition and memory are normal.   Assessment & Plan:  See encounters tab for problem based medical decision making. Patient discussed with Dr. Josem KaufmannKlima

## 2016-10-07 NOTE — Patient Instructions (Signed)
Ms. Jiles ProwsMcAllister,  Thank you for coming for your Pap smear today. I will call you with your results when they are back in the next 2-3 days. Please make a follow-up appointment to see her primary care doctor in 3-6 months for a checkup. Please continue taking medications as prescribed.

## 2016-10-07 NOTE — Assessment & Plan Note (Addendum)
Patient denies any abnormal vaginal discharge, vaginal itching, dysuria, painful lesions. Her last Pap smear was reportedly normal in February 2017. She denies history of abnormal Pap smears.  Plan: -Pap smear -If normal, would repeat in 5 years

## 2016-10-07 NOTE — Assessment & Plan Note (Signed)
BP Readings from Last 3 Encounters:  10/07/16 (!) 124/51  07/30/16 (!) 118/55  04/12/16 139/82    Lab Results  Component Value Date   NA 143 04/12/2016   K 4.3 04/12/2016   CREATININE 0.80 04/12/2016    Assessment: Blood pressure control:  controlled Progress toward BP goal:   at goal Comments: Compliant with amlodipine 5 mg once a day and olmesartan  Plan: Medications:  continue current medications Other plans: Patient remains on potassium replacement. She was previously on potassium replacement given concomitant use of hydrochlorothiazide. Hydrochlorothiazide has since been discontinued. We will recheck a basic metabolic panel today to assess for need for potassium. I suspect that she will be able to discontinue her potassium.

## 2016-10-07 NOTE — Progress Notes (Signed)
Case discussed with Dr. Burns soon after the resident saw the patient. We reviewed the resident's history and exam and pertinent patient test results. I agree with the assessment, diagnosis, and plan of care documented in the resident's note. 

## 2016-10-08 ENCOUNTER — Encounter: Payer: Self-pay | Admitting: Internal Medicine

## 2016-10-08 LAB — BMP8+ANION GAP
ANION GAP: 18 mmol/L (ref 10.0–18.0)
BUN / CREAT RATIO: 9 (ref 9–23)
BUN: 8 mg/dL (ref 6–24)
CO2: 22 mmol/L (ref 18–29)
CREATININE: 0.88 mg/dL (ref 0.57–1.00)
Calcium: 8.9 mg/dL (ref 8.7–10.2)
Chloride: 104 mmol/L (ref 96–106)
GFR calc Af Amer: 84 mL/min/{1.73_m2} (ref >59)
GFR, EST NON AFRICAN AMERICAN: 73 mL/min/{1.73_m2} (ref >59)
Glucose: 105 mg/dL — ABNORMAL HIGH (ref 65–99)
POTASSIUM: 3.9 mmol/L (ref 3.5–5.2)
Sodium: 144 mmol/L (ref 134–144)

## 2016-10-08 LAB — CYTOLOGY - PAP: DIAGNOSIS: NEGATIVE

## 2016-10-09 ENCOUNTER — Telehealth: Payer: Self-pay

## 2016-10-09 NOTE — Telephone Encounter (Signed)
error 

## 2016-11-05 ENCOUNTER — Telehealth: Payer: Self-pay | Admitting: Internal Medicine

## 2016-11-05 NOTE — Telephone Encounter (Signed)
APT. REMINDER CALL, LMTCB °

## 2016-11-06 ENCOUNTER — Ambulatory Visit: Payer: Self-pay

## 2016-11-13 ENCOUNTER — Telehealth: Payer: Self-pay | Admitting: Internal Medicine

## 2016-11-13 NOTE — Telephone Encounter (Signed)
APT. REMINDER CALL, NO ANSWER, VOICEMAIL FULL °

## 2016-11-14 ENCOUNTER — Ambulatory Visit: Payer: Self-pay

## 2017-01-24 ENCOUNTER — Encounter: Payer: Self-pay | Admitting: *Deleted

## 2017-02-03 ENCOUNTER — Encounter: Payer: Self-pay | Admitting: Internal Medicine

## 2017-02-03 ENCOUNTER — Ambulatory Visit (INDEPENDENT_AMBULATORY_CARE_PROVIDER_SITE_OTHER): Payer: Self-pay | Admitting: Internal Medicine

## 2017-02-03 VITALS — BP 138/59 | HR 73 | Temp 98.3°F | Ht 63.0 in | Wt 219.3 lb

## 2017-02-03 DIAGNOSIS — Z7901 Long term (current) use of anticoagulants: Secondary | ICD-10-CM

## 2017-02-03 DIAGNOSIS — R600 Localized edema: Secondary | ICD-10-CM

## 2017-02-03 DIAGNOSIS — I10 Essential (primary) hypertension: Secondary | ICD-10-CM

## 2017-02-03 DIAGNOSIS — Z79899 Other long term (current) drug therapy: Secondary | ICD-10-CM

## 2017-02-03 DIAGNOSIS — E785 Hyperlipidemia, unspecified: Secondary | ICD-10-CM

## 2017-02-03 DIAGNOSIS — Z86711 Personal history of pulmonary embolism: Secondary | ICD-10-CM

## 2017-02-03 MED ORDER — LOSARTAN POTASSIUM 50 MG PO TABS: 100.0000 mg | ORAL_TABLET | Freq: Every day | ORAL | 4 refills | Status: DC

## 2017-02-03 MED ORDER — OLMESARTAN MEDOXOMIL 40 MG PO TABS: 40.0000 mg | ORAL_TABLET | Freq: Every day | ORAL | 2 refills | Status: DC

## 2017-02-03 MED ORDER — RIVAROXABAN 20 MG PO TABS: 20.0000 mg | ORAL_TABLET | Freq: Every day | ORAL | 3 refills | Status: DC

## 2017-02-03 MED ORDER — SIMVASTATIN 40 MG PO TABS
40.0000 mg | ORAL_TABLET | Freq: Every day | ORAL | 3 refills | Status: DC
Start: 2017-02-03 — End: 2017-02-03

## 2017-02-03 MED ORDER — SIMVASTATIN 40 MG PO TABS: 40.0000 mg | ORAL_TABLET | Freq: Every day | ORAL | 3 refills | Status: DC

## 2017-02-03 NOTE — Patient Instructions (Addendum)
For your leg swelling, try wearing compression stockings and keeping your feet elevated as much as you can.  For your blood pressure, I would like you to start taking losartan. You can stop taking amlodipine, which might be making your leg swelling worse. Let's have you come back in about a month to check on your blood pressure and leg swelling.  [facility]. If you ever have any troubles getting medicines, please call the clinic.

## 2017-02-03 NOTE — Assessment & Plan Note (Addendum)
History of multiple thromoboembolic events in 2010 in 2012.  Current medications: Rivaroxaban 20 mg daily, nonadherent for several months due to cost  History of recurrent thromboembolic disease, no evidence of acute clot. Plan for indefinite anticoagulation. -Resume Xarelto -Request pharmacist for assistance obtaining DOAC

## 2017-02-03 NOTE — Progress Notes (Signed)
   CC: "I'm actually feeling pretty good."  HPI:  [name] is a 61 y.o. 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  Please see A&P for status of the patient's chronic medical conditions.   Past Medical History:  Diagnosis Date  . DVT (deep venous thrombosis) (HCC)   . Hyperlipidemia   . Hypertension   . Leg pain    right  . PE (pulmonary embolism)     Review of Systems:  Review of Systems  Constitutional: Negative for chills and fever.  Respiratory: Positive for shortness of breath. Negative for cough.        Shortness of breath when climbing multiple flights of stairs  Cardiovascular: Positive for leg swelling. Negative for chest pain, orthopnea and PND.    Physical Exam:  Vitals:   02/03/17 1439  BP: (!) 138/59  Pulse: 73  Temp: 98.3 F (36.8 C)  TempSrc: Oral  SpO2: 100%  Weight: 219 lb 4.8 oz (99.5 kg)  Height: 5\' 3"  (1.6 m)   Body mass index is 38.85 kg/m.  Physical Exam  Constitutional: She is oriented to person, place, and time. She appears well-developed and well-nourished. No distress.  Cardiovascular: Normal rate and regular rhythm.   Pulmonary/Chest: Effort normal and breath sounds normal. She has no rales.  Musculoskeletal:  Bilateral lower extremities with symmetric ankle edema and 1+ pitting edema in lower shin No posterior calf tenderness or palpable cord  Neurological: She is alert and oriented to person, place, and time.  Skin:  Lower legs without hyperpigmentation or dilated superficial veins  Psychiatric: She has a normal mood and affect. Her behavior is normal.     Assessment & Plan:   See Encounters Tab for problem based charting.  Patient discussed with Dr. Rogelia BogaButcher

## 2017-02-03 NOTE — Assessment & Plan Note (Addendum)
BP Readings from Last 3 Encounters:  02/03/17 (!) 138/59  10/07/16 (!) 124/51  07/30/16 (!) 118/55   Lab Results  Component Value Date   CREATININE 0.87 02/03/2017   Lab Results  Component Value Date   K 3.8 02/03/2017    Current medications: amlodipine 5 mg daily, KCl 20 mEq daily  Has not had previously prescribed olmesartan 40 mg daily for several months due to cost.  Was prescribed potassium supplements when previously on HCTZ.  Assessment BP goal: <140/90 BP control: at goal  Plan Medications: Discontinue amlodipine due to lower extremity edema, discontinue potassium. Resume ARB with losartan 100 mg daily Other: -BMP today -Return to clinic 1 month for blood pressure check and repeat BMP

## 2017-02-04 ENCOUNTER — Encounter: Payer: Self-pay | Admitting: Internal Medicine

## 2017-02-04 DIAGNOSIS — R600 Localized edema: Secondary | ICD-10-CM | POA: Insufficient documentation

## 2017-02-04 LAB — BMP8+ANION GAP
Anion Gap: 18 mmol/L (ref 10.0–18.0)
BUN/Creatinine Ratio: 14 (ref 9–23)
BUN: 12 mg/dL (ref 6–24)
CHLORIDE: 103 mmol/L (ref 96–106)
CO2: 22 mmol/L (ref 20–29)
Calcium: 9.3 mg/dL (ref 8.7–10.2)
Creatinine, Ser: 0.87 mg/dL (ref 0.57–1.00)
GFR calc Af Amer: 84 mL/min/{1.73_m2} (ref >59)
GFR calc non Af Amer: 73 mL/min/{1.73_m2} (ref >59)
Glucose: 75 mg/dL (ref 65–99)
Potassium: 3.8 mmol/L (ref 3.5–5.2)
SODIUM: 143 mmol/L (ref 134–144)

## 2017-02-04 NOTE — Assessment & Plan Note (Addendum)
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 At the end of these days her feet were very swollen and painful. After sleeping next morning the swelling go down the symptoms were so bad that she the job after 2 days. She has had some swelling in the week since, but not nearly as bad as she was working.  She says she has never had problems with swelling in her feet before. She sleeps on one pillow at night and does not have PND.  If she climbs multiple flights of stairs she feels somewhat short of breath, which he thinks may be a bit worse than a year ago.  Today she has symmetrical edema of her bilateral ankles.  Her edema is most likely due to a combination of dependent positioning, heat, and amlodipine. I do not think that she has a DVT (Wells 1 for previous DVT).  -Compression stockings -Elevation -Discontinue amlodipine

## 2017-02-04 NOTE — Progress Notes (Signed)
Internal Medicine Clinic Attending  Case discussed with Dr. O'Sullivan at the time of the visit.  We reviewed the resident's history and exam and pertinent patient test results.  I agree with the assessment, diagnosis, and plan of care documented in the resident's note. 

## 2017-02-04 NOTE — Assessment & Plan Note (Addendum)
  Lipid Panel     Component Value Date/Time   CHOL 126 04/12/2016 1533   TRIG 68 04/12/2016 1533   HDL 46 04/12/2016 1533   CHOLHDL 2.7 04/12/2016 1533   CHOLHDL 5.4 01/19/2009 0227   VLDL 21 01/19/2009 0227   LDLCALC 66 04/12/2016 1533   Good response to statin therapy seen on simvastatin. She has now not taken her statin for several months due to cost.  She has the orange card and [facility]  -Resume moderate intensity statin therapy with simvastatin 40 mg daily

## 2017-02-05 ENCOUNTER — Ambulatory Visit: Payer: Self-pay | Admitting: Pharmacist

## 2017-02-05 DIAGNOSIS — Z79899 Other long term (current) drug therapy: Secondary | ICD-10-CM

## 2017-02-05 NOTE — Progress Notes (Signed)
Natalie RoysMary L Carey is a 61 y.o. female who reports to the clinic for review of rivaroxaban (Xarelto) therapy.    ASSESSMENT Indication(s): DVT and PE history Duration: indefinite  Labs: Component Value Date/Time   AST 14 11/23/2013 0257   ALT 14 11/23/2013 0257   NA 143 02/03/2017 1552   K 3.8 02/03/2017 1552   CL 103 02/03/2017 1552   CO2 22 02/03/2017 1552   GLUCOSE 75 02/03/2017 1552   GLUCOSE 85 09/13/2014 1224   HGBA1C  01/19/2009 0545    5.5 (NOTE) The ADA recommends the following therapeutic goal for glycemic control related to Hgb A1c measurement: Goal of therapy: <6.5 Hgb A1c  Reference: American Diabetes Association: Clinical Practice Recommendations 2010, Diabetes Care, 2010, 33: (Suppl  1).   BUN 12 02/03/2017 1552   CREATININE 0.87 02/03/2017 1552   CALCIUM 9.3 02/03/2017 1552   GFRAA 84 02/03/2017 1552   WBC 7.3 04/12/2016 1533   WBC 6.3 09/13/2014 1047   HGB 13.8 04/12/2016 1533   HGB 11.9 02/18/2011 1519   HCT 42.4 04/12/2016 1533   HCT 36.0 02/18/2011 1519   PLT 283 04/12/2016 1533   rivaroxaban (Xarelto) Dose: 20 mg daily  Safety: Patient has not had recent bleeding/thromboembolic events. Patient reports no recent signs or symptoms of bleeding, no signs of symptoms of thromboembolism. Medication changes: no.  Renal/hepatic/drug interaction concerns: no.  Adherence: Patient does correctly recite the dose. Patient states she ran out of rivaroxaban and is awaiting patient assistance program application [facility]. Provided samples today.  Follow-up Patient advised to contact clinic or seek medical attention if signs/symptoms of bleeding or thromboembolism occur. Patient verbalized understanding by repeating back information.  Marzetta BoardJennifer Kim Clinical Pharmacist  02/05/2017, 9:08 AM

## 2017-02-28 ENCOUNTER — Ambulatory Visit: Payer: Self-pay

## 2017-03-15 ENCOUNTER — Encounter (HOSPITAL_COMMUNITY): Payer: Self-pay

## 2017-03-15 DIAGNOSIS — K625 Hemorrhage of anus and rectum: Secondary | ICD-10-CM | POA: Insufficient documentation

## 2017-03-15 DIAGNOSIS — Z79899 Other long term (current) drug therapy: Secondary | ICD-10-CM | POA: Insufficient documentation

## 2017-03-15 DIAGNOSIS — K649 Unspecified hemorrhoids: Secondary | ICD-10-CM | POA: Insufficient documentation

## 2017-03-15 DIAGNOSIS — I10 Essential (primary) hypertension: Secondary | ICD-10-CM | POA: Insufficient documentation

## 2017-03-15 DIAGNOSIS — Z7901 Long term (current) use of anticoagulants: Secondary | ICD-10-CM | POA: Insufficient documentation

## 2017-03-15 LAB — COMPREHENSIVE METABOLIC PANEL
ALT: 15 U/L (ref 14–54)
AST: 20 U/L (ref 15–41)
Albumin: 4.1 g/dL (ref 3.5–5.0)
Alkaline Phosphatase: 123 U/L (ref 38–126)
Anion gap: 8 (ref 5–15)
BILIRUBIN TOTAL: 0.6 mg/dL (ref 0.3–1.2)
BUN: 9 mg/dL (ref 6–20)
CALCIUM: 9.1 mg/dL (ref 8.9–10.3)
CO2: 25 mmol/L (ref 22–32)
CREATININE: 0.97 mg/dL (ref 0.44–1.00)
Chloride: 108 mmol/L (ref 101–111)
GFR calc Af Amer: >60 mL/min (ref >60)
Glucose, Bld: 100 mg/dL — ABNORMAL HIGH (ref 65–99)
Potassium: 3.5 mmol/L (ref 3.5–5.1)
Sodium: 141 mmol/L (ref 135–145)
Total Protein: 7 g/dL (ref 6.5–8.1)

## 2017-03-15 LAB — CBC
HCT: 45 % (ref 36.0–46.0)
Hemoglobin: 14.9 g/dL (ref 12.0–15.0)
MCH: 26.9 pg (ref 26.0–34.0)
MCHC: 33.1 g/dL (ref 30.0–36.0)
MCV: 81.4 fL (ref 78.0–100.0)
PLATELETS: 271 10*3/uL (ref 150–400)
RBC: 5.53 MIL/uL — ABNORMAL HIGH (ref 3.87–5.11)
RDW: 13.4 % (ref 11.5–15.5)
WBC: 9.8 10*3/uL (ref 4.0–10.5)

## 2017-03-15 LAB — PROTIME-INR
INR: 1.03
Prothrombin Time: 13.5 seconds (ref 11.4–15.2)

## 2017-03-15 LAB — TYPE AND SCREEN
ABO/RH(D): A POS
Antibody Screen: NEGATIVE

## 2017-03-15 NOTE — ED Triage Notes (Signed)
Pt reports two episodes of bright red blood mixed in with her bowel movements. She states the bleeding occurs only when having a bowel movement. She does take Xarelto. Denies abdominal pain.

## 2017-03-16 ENCOUNTER — Emergency Department (HOSPITAL_COMMUNITY)
Admission: EM | Admit: 2017-03-16 | Discharge: 2017-03-16 | Disposition: A | Discharge status: Home / Self Care | Payer: Self-pay | Attending: Emergency Medicine | Admitting: Emergency Medicine

## 2017-03-16 DIAGNOSIS — K649 Unspecified hemorrhoids: Secondary | ICD-10-CM

## 2017-03-16 DIAGNOSIS — K625 Hemorrhage of anus and rectum: Secondary | ICD-10-CM

## 2017-03-16 MED ORDER — HYDROCORTISONE 2.5 % RE CREA: TOPICAL_CREAM | RECTAL | 0 refills | Status: DC

## 2017-03-16 NOTE — ED Provider Notes (Signed)
[facility]-EMERGENCY DEPT Provider Note   CSN: [id number] Arrival date & time: 03/15/17  2032     History   Chief Complaint Chief Complaint  Patient presents with  . Rectal Bleeding    HPI [name] is a 61 y.o. female.  The patient presents with concern for blood per rectum with bowel movements x 2  Over the last one day. No abdominal pain, nausea, vomiting, fever. No history of GI bleeding, hemorrhoids. She reports she is due for a colonoscopy as it has been 10 years since the previous one. No urinary symptoms. She denies significant constipation   The history is provided by the patient. No language interpreter was used.    Past Medical History:  Diagnosis Date  . DVT (deep venous thrombosis) (HCC)   . Hyperlipidemia   . Hypertension   . Leg pain    right  . PE (pulmonary embolism)     Patient Active Problem List   Diagnosis Date Noted  . Bilateral lower extremity edema 02/04/2017  . History of pulmonary embolus (PE) 04/12/2016  . Allergic rhinitis 04/12/2016  . Healthcare maintenance 04/12/2016  . Hyperlipidemia 01/30/2009  . Obesity, Class II, BMI 35-39.9, with comorbidity (HCC) 01/30/2009  . SLEEP APNEA, OBSTRUCTIVE 01/30/2009  . Essential hypertension 01/30/2009    Past Surgical History:  Procedure Laterality Date  . ABDOMINAL HYSTERECTOMY    . APPENDECTOMY    . BACK SURGERY    . CHOLECYSTECTOMY     Gall Bladder removal  . LAMINOTOMY     right at L4-5 with a disc bulge and superimposed right lateral recess protrusion encroaching on the L5 root  . SPINE SURGERY     decompression surgery    OB History    No data available       Home Medications    Prior to Admission medications   Medication Sig Start Date End Date Taking? Authorizing Provider  losartan (COZAAR) 50 MG tablet Take 2 tablets (100 mg total) by mouth daily. 02/03/17 02/03/18  Alm Bustard'Sullivan, Matthew, MD  montelukast (SINGULAIR) 10 MG tablet Take 1 tablet (10 mg total) by mouth at  bedtime. 04/12/16   Burns, Tinnie GensAlexa R, MD  rivaroxaban (XARELTO) 20 MG TABS tablet Take 1 tablet (20 mg total) by mouth daily with supper. 02/03/17   Alm Bustard'Sullivan, Matthew, MD  simvastatin (ZOCOR) 40 MG tablet Take 1 tablet (40 mg total) by mouth daily. 02/03/17   Alm Bustard'Sullivan, Matthew, MD    Family History Family History  Problem Relation Age of Onset  . Hypertension Other   . Heart disease Other   . Diabetes Other   . Coronary artery disease Other     Social History Social History  Substance Use Topics  . Smoking status: Never Smoker  . Smokeless tobacco: Never Used  . Alcohol use No     Allergies   Ciprofloxacin   Review of Systems Review of Systems  Constitutional: Negative for chills and fever.  Gastrointestinal: Positive for blood in stool. Negative for abdominal pain.  Musculoskeletal: Negative.   Skin: Negative.   Neurological: Negative.      Physical Exam Updated Vital Signs BP (S) (!) 194/110 (BP Location: Right Arm) Comment: has not yet taken BP meds  Pulse 77   Temp 98.5 F (36.9 C) (Oral)   Resp 18   SpO2 99%   Physical Exam  Constitutional: She is oriented to person, place, and time. She appears well-developed and well-nourished.  Neck: Normal range of motion.  Pulmonary/Chest: Effort  normal.  Abdominal: Soft. Bowel sounds are normal. She exhibits no distension. There is no tenderness.  Genitourinary:  Genitourinary Comments: Non-bleeding external hemorrhoid present. Normal color stool.   Neurological: She is alert and oriented to person, place, and time.  Skin: Skin is warm and dry.     ED Treatments / Results  Labs (all labs ordered are listed, but only abnormal results are displayed) Labs Reviewed  COMPREHENSIVE METABOLIC PANEL - Abnormal; Notable for the following:       Result Value   Glucose, Bld 100 (*)    All other components within normal limits  CBC - Abnormal; Notable for the following:    RBC 5.53 (*)    All other components within  normal limits  PROTIME-INR  POC OCCULT BLOOD, ED  TYPE AND SCREEN    EKG  EKG Interpretation None       Radiology No results found.  Procedures Procedures (including critical care time)  Medications Ordered in ED Medications - No data to display   Initial Impression / Assessment and Plan / ED Course  I have reviewed the triage vital signs and the nursing notes.  Pertinent labs & imaging results that were available during my care of the patient were reviewed by me and considered in my medical decision making (see chart for details).     Patient presents with blood per rectum on 2 bowel movements. No bleeding in between. No abdominal pain.  She is found to have a hemorrhoid that is not bleeding at present. Normal color stool. VSS. Labs unremarkable. She is stable for discharge home and encouraged to have scheduled follow up with GI.   Final Clinical Impressions(s) / ED Diagnoses   Final diagnoses:  None   1. Hemorrhoid 2. Rectal bleeding.  New Prescriptions New Prescriptions   No medications on file     Danne HarborUpstill, Ibtisam Benge, PA-C 03/23/17 2209    Zadie RhineWickline, Donald, MD 03/24/17 [phone]

## 2017-03-16 NOTE — ED Notes (Signed)
Pt stable, ambulatory, states understanding of discharge instructions 

## 2017-03-25 ENCOUNTER — Other Ambulatory Visit: Payer: Self-pay | Admitting: Gastroenterology

## 2017-04-23 ENCOUNTER — Ambulatory Visit (INDEPENDENT_AMBULATORY_CARE_PROVIDER_SITE_OTHER): Payer: Self-pay | Admitting: Internal Medicine

## 2017-04-23 VITALS — BP 138/76 | HR 81 | Temp 98.8°F | Ht 63.0 in | Wt 216.6 lb

## 2017-04-23 DIAGNOSIS — Z8249 Family history of ischemic heart disease and other diseases of the circulatory system: Secondary | ICD-10-CM

## 2017-04-23 DIAGNOSIS — Z7901 Long term (current) use of anticoagulants: Secondary | ICD-10-CM

## 2017-04-23 DIAGNOSIS — I10 Essential (primary) hypertension: Secondary | ICD-10-CM

## 2017-04-23 DIAGNOSIS — K625 Hemorrhage of anus and rectum: Secondary | ICD-10-CM

## 2017-04-23 DIAGNOSIS — Z79899 Other long term (current) drug therapy: Secondary | ICD-10-CM

## 2017-04-23 DIAGNOSIS — Z86711 Personal history of pulmonary embolism: Secondary | ICD-10-CM

## 2017-04-23 MED ORDER — AMLODIPINE BESYLATE 5 MG PO TABS: 5.0000 mg | ORAL_TABLET | Freq: Every day | ORAL | 2 refills | Status: DC

## 2017-04-23 NOTE — Addendum Note (Signed)
Addended by: Earl LagosNARENDRA, Jayke Caul on: 04/23/2017 03:25 PM   Modules accepted: Level of Service

## 2017-04-23 NOTE — Progress Notes (Signed)
Internal Medicine Clinic Attending  I saw and evaluated the patient.  I personally confirmed the key portions of the history and exam documented by Dr. Winfrey and I reviewed pertinent patient test results.  The assessment, diagnosis, and plan were formulated together and I agree with the documentation in the resident's note. 

## 2017-04-23 NOTE — Assessment & Plan Note (Signed)
Pt reports her blood pressure has been high when she checks it at [facility], when she went to the emergency room and for her prescreening for colonoscopy,  so she restarted her amlodipine 5mg  with good results.  Her blood pressure today is 138/76  -Continue losartan 100mg  daily -added back pts amlodipine 5mg  and wrote her a new prescription

## 2017-04-23 NOTE — Progress Notes (Signed)
CC: clearance for colonoscopy, follow up on BRBPR  HPI:  [name] is a 61 y.o. who is here for follow up on BRBPR, she was seen in the ED where a minimally bleeding hemorrhoid was visualized on exam.  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 Her GI doctor requested a form be filled out on whether to hold her xarelto for her upcoming colonoscopy in mid October of this year.  We also discussed her hypertension and other health maintenance items particularly vaccinations and mammogram.  She denies any more BRBPR since her visit to the ED on 03/16/17.  She said she went because she noticed blood on her toilet tissue after she would pass stool.  She had a normal bowel movement this morning with no blood on tissue paper or on surface of stool.  Pt states she was not on xarelto at the time of her visit to the ED and has not been on it since.  She reports she lost the paperwork where [place] [facility] [facility]    Please see A&P for status of the patient's chronic medical conditions  Past Medical History:  Diagnosis Date  . DVT (deep venous thrombosis) (HCC)   . Hyperlipidemia   . Hypertension   . Leg pain    right  . PE (pulmonary embolism)    Review of Systems: ROS: Pulmonary: pt denies increased work of breathing, shortness of breath,  Cardiac: pt denies palpitations, chest pain,  Abdominal: pt denies abdominal pain, nausea, vomiting, or diarrhea  Physical Exam:  Vitals:   04/23/17 0917  BP: 138/76  Pulse: 81  Temp: 98.8 F (37.1 C)  TempSrc: Oral  SpO2: 99%  Weight: 216 lb 9.6 oz (98.2 kg)  Height: 5\' 3"  (1.6 m)   Physical Exam  Constitutional: She is oriented to person, place, and time. She appears well-developed and well-nourished.  Eyes: Right eye exhibits no discharge. Left eye exhibits no discharge. No scleral icterus.  Neck: No JVD present.  Cardiovascular: Normal rate, regular rhythm and normal heart  sounds.  Exam reveals no gallop and no friction rub.   No murmur heard. Pulmonary/Chest: Effort normal and breath sounds normal. No respiratory distress. She has no wheezes. She has no rales.  Abdominal: Soft. Bowel sounds are normal. She exhibits no distension. There is no tenderness. There is no guarding.  Musculoskeletal: She exhibits no edema or tenderness.  Neurological: She is alert and oriented to person, place, and time.  Skin: Skin is warm and dry.  Psychiatric: She has a normal mood and affect. Her behavior is normal.    Social History   Social History  . Marital status: Married    Spouse name: N/A  . Number of children: N/A  . Years of education: N/A   Occupational History  . Not on file.   Social History Main Topics  . Smoking status: Never Smoker  . Smokeless tobacco: Never Used  . Alcohol use No  . Drug use: Unknown  . Sexual activity: No   Other Topics Concern  . Not on file   Social History Narrative  . No narrative on file    Family History  Problem Relation Age of Onset  . Hypertension Other   . Heart disease Other   . Diabetes Other   . Coronary artery disease Other     Assessment & Plan:    BRBPR: pt had hemorrhoid visualized during visit to  emergency room and gives a good history suggestive of hemorrhoidal bleeding as per HPI.  She has had no bleeding since and has not been taking her xarelto.  -issue has resolved but will start pt on OTC metamucil to be taken at the end of the day before bed long after all medications have been taken.    Health maintenance: pt refused all vaccinations she is due for including seasonal influenza, she does not believe in vaccinations.   See Encounters Tab for problem based charting.  Patient seen with Dr. Heide Spark

## 2017-04-23 NOTE — Patient Instructions (Signed)
Please follow up with Dr. Selena BattenKim our pharmacist and restart the xarelto if I am unable to get your colonoscopy moved up to an earlier date. We restarted your amlodipine today as well, continue to take it and let us know if there is any new leg swelling.

## 2017-04-23 NOTE — Assessment & Plan Note (Addendum)
History of recurrent PE's 2010 and 2012 however pt has been out of her xarelto for over one month due to losing paperwork to help get [facility]  -important to get pt back on xarelto due to high risk of DVT/PE -Will consult with clinical pharmacist to help pt get restarted.   -Pt saw Dr. Selena BattenKim and was given samples and help to receive [place]

## 2017-04-24 ENCOUNTER — Telehealth: Payer: Self-pay | Admitting: *Deleted

## 2017-04-24 NOTE — Telephone Encounter (Signed)
[facility] calls and states there is potential level 1 interaction with amlodipine and simvastatin, please advise

## 2017-04-25 ENCOUNTER — Telehealth: Payer: Self-pay | Admitting: Internal Medicine

## 2017-04-25 DIAGNOSIS — I10 Essential (primary) hypertension: Secondary | ICD-10-CM

## 2017-04-25 MED ORDER — CARVEDILOL 6.25 MG PO TABS: 6.2500 mg | ORAL_TABLET | Freq: Two times a day (BID) | ORAL | 2 refills | Status: DC

## 2017-04-25 NOTE — Telephone Encounter (Signed)
I called the pt and she is not taking the amlodipine.  We discussed to continue her simvastatin and that  I am going to discontinue it in the chart and switch to another agent.  I am currently trying to see if [place] [facility] has carvedilol in stock.

## 2017-04-25 NOTE — Telephone Encounter (Signed)
Discussed with patient about her colonoscopy and restarting her xarelto afterwards.  Additionally we talked about discontinuing her amlodipine and beginning pt on carvedilol 6.5mg  BID

## 2017-04-29 ENCOUNTER — Other Ambulatory Visit: Payer: Self-pay | Admitting: Gastroenterology

## 2017-05-06 ENCOUNTER — Ambulatory Visit (HOSPITAL_COMMUNITY)
Admission: RE | Admit: 2017-05-06 | Discharge: 2017-05-06 | Disposition: A | Discharge status: Home / Self Care | Payer: Self-pay | Source: Ambulatory Visit | Attending: Gastroenterology | Admitting: Gastroenterology

## 2017-05-06 ENCOUNTER — Encounter (HOSPITAL_COMMUNITY): Payer: Self-pay

## 2017-05-06 ENCOUNTER — Encounter (HOSPITAL_COMMUNITY)
Admission: RE | Disposition: A | Discharge status: Home / Self Care | Payer: Self-pay | Source: Ambulatory Visit | Attending: Gastroenterology

## 2017-05-06 ENCOUNTER — Ambulatory Visit (HOSPITAL_COMMUNITY): Payer: Self-pay | Admitting: Anesthesiology

## 2017-05-06 DIAGNOSIS — Z79899 Other long term (current) drug therapy: Secondary | ICD-10-CM | POA: Insufficient documentation

## 2017-05-06 DIAGNOSIS — K640 First degree hemorrhoids: Secondary | ICD-10-CM | POA: Insufficient documentation

## 2017-05-06 DIAGNOSIS — K625 Hemorrhage of anus and rectum: Secondary | ICD-10-CM

## 2017-05-06 DIAGNOSIS — K635 Polyp of colon: Secondary | ICD-10-CM | POA: Insufficient documentation

## 2017-05-06 DIAGNOSIS — I10 Essential (primary) hypertension: Secondary | ICD-10-CM | POA: Insufficient documentation

## 2017-05-06 DIAGNOSIS — G4730 Sleep apnea, unspecified: Secondary | ICD-10-CM | POA: Insufficient documentation

## 2017-05-06 HISTORY — PX: COLONOSCOPY WITH PROPOFOL: SHX5780

## 2017-05-06 HISTORY — DX: Hemorrhage of anus and rectum: K62.5

## 2017-05-06 HISTORY — DX: Sleep apnea, unspecified: G47.30

## 2017-05-06 SURGERY — COLONOSCOPY WITH PROPOFOL
Anesthesia: Monitor Anesthesia Care

## 2017-05-06 MED ORDER — LIDOCAINE 2% (20 MG/ML) 5 ML SYRINGE
INTRAMUSCULAR | Status: AC
Start: 2017-05-06 — End: ?
  Filled 2017-05-06: qty 5

## 2017-05-06 MED ORDER — LACTATED RINGERS IV SOLN
INTRAVENOUS | Status: DC | PRN
  Administered 2017-05-06: 10:00:00 via INTRAVENOUS

## 2017-05-06 MED ORDER — PROPOFOL 10 MG/ML IV BOLUS
INTRAVENOUS | Status: DC | PRN
  Administered 2017-05-06: 10 mg via INTRAVENOUS
  Administered 2017-05-06: 50 mg via INTRAVENOUS
  Administered 2017-05-06: 20 mg via INTRAVENOUS

## 2017-05-06 MED ORDER — SODIUM CHLORIDE 0.9 % IV SOLN: INTRAVENOUS | Status: DC

## 2017-05-06 MED ORDER — PROPOFOL 10 MG/ML IV BOLUS
INTRAVENOUS | Status: AC
  Filled 2017-05-06: qty 60

## 2017-05-06 MED ORDER — LIDOCAINE 2% (20 MG/ML) 5 ML SYRINGE
INTRAMUSCULAR | Status: DC | PRN
  Administered 2017-05-06: 100 mg via INTRAVENOUS

## 2017-05-06 MED ORDER — PROPOFOL 500 MG/50ML IV EMUL
INTRAVENOUS | Status: DC | PRN
  Administered 2017-05-06: 100 ug/kg/min via INTRAVENOUS

## 2017-05-06 SURGICAL SUPPLY — 22 items

## 2017-05-06 NOTE — Discharge Instructions (Signed)

## 2017-05-06 NOTE — Anesthesia Postprocedure Evaluation (Signed)
Anesthesia Post Note  Patient: Caroll Rancher  Procedure(s) Performed: Procedure(s) (LRB): COLONOSCOPY WITH PROPOFOL (N/A)     Patient location during evaluation: PACU Anesthesia Type: MAC Level of consciousness: awake and alert Pain management: pain level controlled Vital Signs Assessment: post-procedure vital signs reviewed and stable Respiratory status: spontaneous breathing, nonlabored ventilation and respiratory function stable Cardiovascular status: stable and blood pressure returned to baseline Postop Assessment: no apparent nausea or vomiting Anesthetic complications: no    Last Vitals:  Vitals:   05/06/17 1105 05/06/17 1110  BP:  (!) 174/88  Pulse:  65  Resp:  16  Temp:    SpO2: 96% 97%    Last Pain:  Vitals:   05/06/17 1052  TempSrc: Oral                 Lynda Rainwater

## 2017-05-06 NOTE — Anesthesia Procedure Notes (Signed)
Date/Time: 05/06/2017 10:28 AM Performed by: Ludwig Lean Pre-anesthesia Checklist: Patient identified, Emergency Drugs available, Suction available and Patient being monitored Patient Re-evaluated:Patient Re-evaluated prior to induction Oxygen Delivery Method: Simple face mask

## 2017-05-06 NOTE — Op Note (Signed)
[facility] Patient Name: [name] Procedure Date: 05/06/2017 MRN: [id number] Attending MD: Shirley Friar , MD Date of Birth: 09/07/1955 CSN: [id number] Age: 61 Admit Type: Outpatient Procedure:                Colonoscopy Indications:              Last colonoscopy: October 2010, Rectal bleeding Providers:                Shirley Friar, MD, Norman Clay, RN, Margo Aye, Technician Referring MD:              Medicines:                Propofol per Anesthesia, Monitored Anesthesia Care Complications:            No immediate complications. Estimated Blood Loss:     Estimated blood loss was minimal. Procedure:                Pre-Anesthesia Assessment:                           - Prior to the procedure, a History and Physical                            was performed, and patient medications and                            allergies were reviewed. The patient's tolerance of                            previous anesthesia was also reviewed. The risks                            and benefits of the procedure and the sedation                            options and risks were discussed with the patient.                            All questions were answered, and informed consent                            was obtained. Prior Anticoagulants: The patient has                            taken no previous anticoagulant or antiplatelet                            agents. ASA Grade Assessment: II - A patient with                            mild systemic disease. After reviewing the risks  and benefits, the patient was deemed in                            satisfactory condition to undergo the procedure.                           After obtaining informed consent, the colonoscope                            was passed under direct vision. Throughout the                            procedure, the patient's blood pressure, pulse,  and                            oxygen saturations were monitored continuously. The                            EC-[id number] ([id number]) scope was introduced through                            the anus and advanced to the the cecum, identified                            by appendiceal orifice and ileocecal valve. The                            colonoscopy was somewhat difficult due to a                            tortuous colon. Successful completion of the                            procedure was aided by straightening and shortening                            the scope to obtain bowel loop reduction. The                            patient tolerated the procedure well. The quality                            of the bowel preparation was adequate and good. The                            ileocecal valve, appendiceal orifice, and rectum                            were photographed. Self-discontinued her Xarelto                            over a month ago. Scope In: 10:32:26 AM Scope Out: 10:46:49 AM Scope Withdrawal Time: 0 hours 10 minutes 8 seconds  Total Procedure Duration:  0 hours 14 minutes 23 seconds  Findings:      The perianal and digital rectal examinations were normal.      Two flat and semi-sessile polyps were found in the sigmoid colon. The       polyps were 1 to 3 mm in size. These polyps were removed with a cold       biopsy forceps. Resection and retrieval were complete. Estimated blood       loss was minimal.      Internal hemorrhoids were found during retroflexion. The hemorrhoids       were medium-sized and Grade I (internal hemorrhoids that do not       prolapse).      A localized area of mildly nodular mucosa was found at the anus. Impression:               - Two 1 to 3 mm polyps in the sigmoid colon,                            removed with a cold biopsy forceps. Resected and                            retrieved.                           - Internal hemorrhoids.                            - Nodular mucosa at the anus. May be due to                            prolapsed tissue but needs further evaluation by a                            surgeon (to be scheduled for the near future). Moderate Sedation:      N/A- Per Anesthesia Care Recommendation:           - Patient has a contact number available for                            emergencies. The signs and symptoms of potential                            delayed complications were discussed with the                            patient. Return to normal activities tomorrow.                            Written discharge instructions were provided to the                            patient.                           - Resume previous diet.                           -  Await pathology results.                           - Repeat colonoscopy for surveillance based on                            pathology results.                           - Refer to a surgeon at appointment to be scheduled                            to evaluate anorectal nodular mucosa.                           - Continue present medications. Procedure Code(s):        --- Professional ---                           [phone], Colonoscopy, flexible; with biopsy, single                            or multiple Diagnosis Code(s):        --- Professional ---                           K62.5, Hemorrhage of anus and rectum                           D12.5, Benign neoplasm of sigmoid colon                           K62.89, Other specified diseases of anus and rectum                           K64.0, First degree hemorrhoids CPT copyright 2016 American Medical Association. All rights reserved. The codes documented in this report are preliminary and upon coder review may  be revised to meet current compliance requirements. Shirley Friar, MD 05/06/2017 11:01:49 AM This report has been signed electronically. Number of Addenda: 0

## 2017-05-06 NOTE — Transfer of Care (Signed)
Immediate Anesthesia Transfer of Care Note  Patient: [name]  Procedure(s) Performed: Procedure(s): COLONOSCOPY WITH PROPOFOL (N/A)  Patient Location: PACU  Anesthesia Type:MAC  Level of Consciousness: Patient easily awoken, sedated, comfortable, cooperative, following commands, responds to stimulation.   Airway & Oxygen Therapy: Patient spontaneously breathing, ventilating well, oxygen via simple oxygen mask.  Post-op Assessment: Report given to PACU RN, vital signs reviewed and stable, moving all extremities.   Post vital signs: Reviewed and stable.  Complications: No apparent anesthesia complications Last Vitals:  Vitals:   05/06/17 1017 05/06/17 1052  BP: (!) 198/109 137/71  Pulse: 60 75  Resp: 14 11  Temp: 36.7 C 36.6 C  SpO2: 98% 100%    Last Pain:  Vitals:   05/06/17 1052  TempSrc: Oral         Complications: No apparent anesthesia complications

## 2017-05-06 NOTE — H&P (Signed)
Date of Initial H&P: 04/29/17  History reviewed, patient examined, no change in status, stable for surgery.  

## 2017-05-06 NOTE — Interval H&P Note (Signed)
History and Physical Interval Note:  05/06/2017 10:08 AM  [name]  has presented today for surgery, with the diagnosis of Hemorrhage of rectum and anus  The various methods of treatment have been discussed with the patient and family. After consideration of risks, benefits and other options for treatment, the patient has consented to  Procedure(s): COLONOSCOPY WITH PROPOFOL (N/A) as a surgical intervention .  The patient's history has been reviewed, patient examined, no change in status, stable for surgery.  I have reviewed the patient's chart and labs.  Questions were answered to the patient's satisfaction.     Mikell Kazlauskas C.

## 2017-05-06 NOTE — Anesthesia Preprocedure Evaluation (Signed)
Anesthesia Evaluation  Patient identified by MRN, date of birth, ID band Patient awake    Reviewed: Allergy & Precautions, NPO status , Patient's Chart, lab work & pertinent test results, reviewed documented beta blocker date and time   Airway Mallampati: II  TM Distance: >3 FB Neck ROM: Full    Dental no notable dental hx.    Pulmonary neg pulmonary ROS, sleep apnea ,    Pulmonary exam normal breath sounds clear to auscultation       Cardiovascular hypertension, Pt. on medications and Pt. on home beta blockers negative cardio ROS Normal cardiovascular exam Rhythm:Regular Rate:Normal     Neuro/Psych negative neurological ROS  negative psych ROS   GI/Hepatic negative GI ROS, Neg liver ROS,   Endo/Other  negative endocrine ROS  Renal/GU negative Renal ROS  negative genitourinary   Musculoskeletal negative musculoskeletal ROS (+)   Abdominal   Peds negative pediatric ROS (+)  Hematology negative hematology ROS (+)   Anesthesia Other Findings   Reproductive/Obstetrics negative OB ROS                             Anesthesia Physical Anesthesia Plan  ASA: II  Anesthesia Plan: MAC   Post-op Pain Management:    Induction: Intravenous  PONV Risk Score and Plan: 2 and Ondansetron, Midazolam and Treatment may vary due to age or medical condition  Airway Management Planned: Nasal Cannula  Additional Equipment:   Intra-op Plan:   Post-operative Plan:   Informed Consent: I have reviewed the patients History and Physical, chart, labs and discussed the procedure including the risks, benefits and alternatives for the proposed anesthesia with the patient or authorized representative who has indicated his/her understanding and acceptance.   Dental advisory given  Plan Discussed with: CRNA  Anesthesia Plan Comments:         Anesthesia Quick Evaluation

## 2017-05-07 ENCOUNTER — Encounter (HOSPITAL_COMMUNITY): Payer: Self-pay | Admitting: Gastroenterology

## 2017-06-02 ENCOUNTER — Ambulatory Visit: Payer: Self-pay

## 2017-06-02 ENCOUNTER — Other Ambulatory Visit: Payer: Self-pay | Admitting: Pharmacist

## 2017-06-02 DIAGNOSIS — Z86711 Personal history of pulmonary embolism: Secondary | ICD-10-CM

## 2017-06-02 DIAGNOSIS — I10 Essential (primary) hypertension: Secondary | ICD-10-CM

## 2017-06-02 MED ORDER — CARVEDILOL 6.25 MG PO TABS: 6.2500 mg | ORAL_TABLET | Freq: Two times a day (BID) | ORAL | 2 refills | Status: DC

## 2017-06-02 MED ORDER — RIVAROXABAN 20 MG PO TABS: 20.0000 mg | ORAL_TABLET | Freq: Every day | ORAL | 3 refills | Status: DC

## 2017-06-02 MED ORDER — ATORVASTATIN CALCIUM 20 MG PO TABS: 20.0000 mg | ORAL_TABLET | Freq: Every day | ORAL | 3 refills | Status: DC

## 2017-06-02 MED ORDER — LOSARTAN POTASSIUM 100 MG PO TABS: 100.0000 mg | ORAL_TABLET | Freq: Every day | ORAL | 3 refills | Status: DC

## 2017-06-02 NOTE — Progress Notes (Addendum)
Patient approved for [facility] program. Prescriptions were transferred to pharmacy.  Patient was notified and advised to contact clinic if any questions. She verbalized understanding

## 2017-06-10 ENCOUNTER — Ambulatory Visit: Payer: Self-pay

## 2017-07-08 ENCOUNTER — Ambulatory Visit: Payer: Self-pay

## 2017-07-16 ENCOUNTER — Ambulatory Visit: Payer: Self-pay

## 2017-07-24 ENCOUNTER — Ambulatory Visit (INDEPENDENT_AMBULATORY_CARE_PROVIDER_SITE_OTHER): Payer: Self-pay | Admitting: Internal Medicine

## 2017-07-24 ENCOUNTER — Encounter: Payer: Self-pay | Admitting: Internal Medicine

## 2017-07-24 VITALS — BP 184/104 | HR 68 | Temp 98.1°F | Ht 63.0 in | Wt 215.6 lb

## 2017-07-24 DIAGNOSIS — K0889 Other specified disorders of teeth and supporting structures: Secondary | ICD-10-CM | POA: Insufficient documentation

## 2017-07-24 DIAGNOSIS — I10 Essential (primary) hypertension: Secondary | ICD-10-CM

## 2017-07-24 DIAGNOSIS — R51 Headache: Secondary | ICD-10-CM

## 2017-07-24 DIAGNOSIS — H9201 Otalgia, right ear: Secondary | ICD-10-CM

## 2017-07-24 MED ORDER — TRAMADOL HCL 50 MG PO TABS: 50.0000 mg | ORAL_TABLET | Freq: Four times a day (QID) | ORAL | 0 refills | Status: AC | PRN

## 2017-07-24 MED ORDER — TRAMADOL HCL 50 MG PO TABS: 50.0000 mg | ORAL_TABLET | Freq: Four times a day (QID) | ORAL | 0 refills | Status: DC | PRN

## 2017-07-24 MED ORDER — AMOXICILLIN-POT CLAVULANATE 875-125 MG PO TABS: 1.0000 | ORAL_TABLET | Freq: Two times a day (BID) | ORAL | 0 refills | Status: DC

## 2017-07-24 MED ORDER — AMOXICILLIN-POT CLAVULANATE 875-125 MG PO TABS: 1.0000 | ORAL_TABLET | Freq: Two times a day (BID) | ORAL | 0 refills | Status: AC

## 2017-07-24 MED FILL — traMADol HCL 50 MG TABS: 50 | 11 days supply | Qty: 42 | Fill #0

## 2017-07-24 NOTE — Patient Instructions (Addendum)
Thank you for allowing us to care for you.  For your tooth pain: - We are prescribing an antibiotic as your pain may be cause by an infection - We are prescribing a pain medication (Tramadol) to be taken every 6 hours as needed for pain, do not drive while taking this medication - We are referring you to the dental clinic  For your elevated blood pressure - Please return to the clinic for a blood pressure check in 2-3 weeks

## 2017-07-24 NOTE — Assessment & Plan Note (Addendum)
Patient presenting with tooth pain starting 3 days ago. The pain is located in her upper right posterior teeth. The pain is described as a 10/10, throbbing, aching, sharp, deep pain with associated headache and R ear ache. She has tried tylenol and oral benzocaine with out relief. She states the pain is worsened by chewing on that side and when she opens her mouth wide. She states she has had a previous episode for similar pain for which she was seen by a dentist within a day that she states she was told was tooth degradation, which was rebuilt by the dentist. Patient has orange card. Will cover for possible abscess. - Referral to Dental Clinic - Augmentin [phone] BID x 10 days  - Tramadol 50mg , q6h PRN Severe Pain, x 14 days

## 2017-07-24 NOTE — Assessment & Plan Note (Addendum)
Patient with elevated blood pressure today of 180s systolic. She denies symptoms other than headache believed to be associated with tooth pain. She has been well controlled in the past with last BP 138/76 and states she has be adherent to her current regimen. Blood pressure elevation may be secondary to severe tooth pain. - Patient to return for BP recheck in 2 weeks - If persistently elevated after resolution of tooth pain will need medication adjustment, patient previously responded well to amlodipine that was stopped due to potetial interactions with her statin and switched to Carvedilol.

## 2017-07-24 NOTE — Progress Notes (Signed)
   CC: Tooth Pain  HPI:  Ms.Samari L Jiles ProwsMcAllister is a 61 y.o. F with PMHx listed below presenting for tooth pain. Please see the A&P for the status of the patient's chronic medical problems.  Patient presenting with tooth pain starting 3 days ago. The pain is located in her upper right posterior teeth. The pain is described as a 10/10, throbbing, aching, sharp, deep pain with associated headache and R ear ache. She has tried tylenol and oral benzocaine with out relief. She states the pain is worsened by chewing on that side and when she opens her mouth wide. She states she has had a previous episode for similar pain for which she was seen by a dentist within a day that she states she was told was tooth degradation, which was rebuilt by the dentist.  Past Medical History:  Diagnosis Date  . DVT (deep venous thrombosis) (HCC)   . Hyperlipidemia   . Hypertension   . Leg pain    right  . PE (pulmonary embolism)   . Sleep apnea    Review of Systems:  Performed and negative except as otherwise indicated.  Physical Exam:  Vitals:   07/24/17 1318 07/24/17 1334 07/24/17 1403  BP: (!) 187/113 (!) 188/89 (!) 184/104  Pulse: 72 64 68  Temp: 98.1 F (36.7 C)    TempSrc: Oral    SpO2: (!) 72%    Weight: 215 lb 9.6 oz (97.8 kg)    Height: 5\' 3"  (1.6 m)     Physical Exam  HENT:  Right Ear: External ear normal.  Left Ear: External ear normal.  Mouth/Throat: Oropharynx is clear and moist.  Apparent dental caries or attrition at R Upper Molars  Cardiovascular: Normal rate, regular rhythm, normal heart sounds and intact distal pulses.  Pulmonary/Chest: Effort normal and breath sounds normal. No respiratory distress.  Abdominal: Soft. Bowel sounds are normal. She exhibits no distension. There is no tenderness.     Assessment & Plan:   See Encounters Tab for problem based charting.  Patient seen with Dr. Cyndie ChimeGranfortuna

## 2017-07-25 NOTE — Progress Notes (Signed)
Medicine attending: I personally interviewed and briefly examined this patient on the day of the patient visit and reviewed pertinent clinical  data  with resident physician Dr. Margot AblesAlex Melvin and we discussed a management plan. Not obvious from exam but duration of symptoms suggest dental abscess. We will prescribe antibiotic/analgesic pending appointment with dentist.

## 2017-08-05 NOTE — Addendum Note (Signed)
Addended by: Beola CordMELVIN, ALEXANDER B on: 08/05/2017 09:13 AM   Modules accepted: Orders

## 2017-08-07 ENCOUNTER — Ambulatory Visit (INDEPENDENT_AMBULATORY_CARE_PROVIDER_SITE_OTHER): Payer: Self-pay | Admitting: Internal Medicine

## 2017-08-07 ENCOUNTER — Encounter: Payer: Self-pay | Admitting: Internal Medicine

## 2017-08-07 VITALS — BP 136/71 | HR 71 | Temp 98.1°F | Ht 63.0 in | Wt 213.8 lb

## 2017-08-07 DIAGNOSIS — Z79899 Other long term (current) drug therapy: Secondary | ICD-10-CM

## 2017-08-07 DIAGNOSIS — I10 Essential (primary) hypertension: Secondary | ICD-10-CM

## 2017-08-07 DIAGNOSIS — K0889 Other specified disorders of teeth and supporting structures: Secondary | ICD-10-CM

## 2017-08-07 NOTE — Assessment & Plan Note (Addendum)
Patient states her tooth pain has significantly improved since ehr last visit and she is not currently in pain. She experienced nausea with the prescribed Tramadol and only took this for 2 days before stopping. She states that she has been taking all doses of the prescribed antibiotic. She has a referral to dental clinic on going. Denies pain today. - Follow up with dental clinic.

## 2017-08-07 NOTE — Progress Notes (Signed)
Internal Medicine Clinic Attending  Case discussed with Dr. Melvin  at the time of the visit.  We reviewed the resident's history and exam and pertinent patient test results.  I agree with the assessment, diagnosis, and plan of care documented in the resident's note.  

## 2017-08-07 NOTE — Patient Instructions (Addendum)
Thank you for allowing us to care for you.  For your high blood pressure: - Your blood pressure today was improved at 136/71 - Continue to take Losartan and Carvedilol.  For your tooth pain: - Follow up with Dental Clinic  Please Follow up with your PCP in about 3 months.

## 2017-08-07 NOTE — Progress Notes (Signed)
   CC: Hypertension, Tooth Pain  HPI:  Ms.Genevia L Jiles ProwsMcAllister is a 61 y.o. F with PMHx listed below presenting for Hypertension, Tooth Pain. Please see the A&P for the status of the patient's chronic medical problems.   Past Medical History:  Diagnosis Date  . DVT (deep venous thrombosis) (HCC)   . Hyperlipidemia   . Hypertension   . Leg pain    right  . PE (pulmonary embolism)   . Sleep apnea    Review of Systems: Performed and all others negative.  Physical Exam:  Vitals:   08/07/17 0900 08/07/17 0902  BP: (!) 159/81 136/71  Pulse: 73 71  Temp: 98.1 F (36.7 C)   TempSrc: Oral   SpO2: 96%   Weight: 213 lb 12.8 oz (97 kg)   Height: 5\' 3"  (1.6 m)    Physical Exam  Constitutional: She appears well-developed and well-nourished.  Cardiovascular: Normal rate, regular rhythm and normal heart sounds.  Pulmonary/Chest: Effort normal and breath sounds normal. No respiratory distress.  Abdominal: Soft. Bowel sounds are normal. She exhibits no distension. There is no tenderness.     Assessment & Plan:   See Encounters Tab for problem based charting.  Patient seen with Dr. Rogelia BogaButcher

## 2017-08-07 NOTE — Assessment & Plan Note (Addendum)
BP today initially 159/81, but improved to 136/71. Patient currently taking Losartan 100mg  Daily and Carvedilol 6.25mg  BID. She states she has been taking her medications. Blood pressure controlled today, will continue current regimen. - Continue Losartan 100mg , Daily - Continue Carvedilol 6.25mg , BID - Follow up with PCP in 3 months

## 2017-09-17 ENCOUNTER — Other Ambulatory Visit: Payer: Self-pay | Admitting: Internal Medicine

## 2017-09-17 DIAGNOSIS — I10 Essential (primary) hypertension: Secondary | ICD-10-CM

## 2017-09-17 NOTE — Telephone Encounter (Signed)
Refilled

## 2017-10-22 ENCOUNTER — Other Ambulatory Visit: Payer: Self-pay

## 2017-10-22 NOTE — Telephone Encounter (Signed)
Question about a recall on losartan (COZAAR) 100 MG tablet. Please call pt back.

## 2017-10-22 NOTE — Telephone Encounter (Signed)
rtc to pt, she was concerned about recalls, she was reassured and informed to call pharm and speak to pharmacist, then if she is concerned to speak w/ dr Frances Furbishwinfrey at next appt, she was informed that his next available appt is in may and advised to call for appt, she was agreeable

## 2017-12-08 ENCOUNTER — Telehealth: Payer: Self-pay

## 2017-12-08 NOTE — Progress Notes (Signed)
Patient called back for follow up on medication review. Patient states [facility], no concerns with medications or signs/symptoms of concern today. Advised patient to call clinic if any questions arise and to make a follow up PCP appointment. Patient verbalized understanding by repeat back.

## 2017-12-08 NOTE — Telephone Encounter (Signed)
Did not pick up. Left voice message to return call.

## 2018-01-19 ENCOUNTER — Other Ambulatory Visit: Payer: Self-pay

## 2018-01-19 ENCOUNTER — Encounter: Payer: Self-pay | Admitting: Internal Medicine

## 2018-01-19 ENCOUNTER — Ambulatory Visit (INDEPENDENT_AMBULATORY_CARE_PROVIDER_SITE_OTHER): Payer: Self-pay | Admitting: Internal Medicine

## 2018-01-19 VITALS — BP 164/79 | HR 85 | Temp 98.7°F | Ht 63.0 in | Wt 215.7 lb

## 2018-01-19 DIAGNOSIS — I10 Essential (primary) hypertension: Secondary | ICD-10-CM

## 2018-01-19 DIAGNOSIS — E785 Hyperlipidemia, unspecified: Secondary | ICD-10-CM

## 2018-01-19 DIAGNOSIS — Z79899 Other long term (current) drug therapy: Secondary | ICD-10-CM

## 2018-01-19 DIAGNOSIS — Z8249 Family history of ischemic heart disease and other diseases of the circulatory system: Secondary | ICD-10-CM

## 2018-01-19 DIAGNOSIS — Z0000 Encounter for general adult medical examination without abnormal findings: Secondary | ICD-10-CM

## 2018-01-19 NOTE — Patient Instructions (Signed)
Good to see you again, glad you are doing well.  We will continue your current medications.  We will take some blood work today as we discussed and I will let you know the results when it comes back.  Please check your bp at [facility] and call for an appointment if the larger number is greater than 140.  Otherwise I will see you at our next visit.  I have given you info about the mammogram scholarship program as well.

## 2018-01-19 NOTE — Progress Notes (Signed)
CC: HTN, health maintenance, HLD  HPI:  [name] is a 62 y.o. female with PMH below.  She is here for a regular checkup to address her health maintenance and HTN, HLD.  Additionally we discussed how her orange card has lapsed but she has an appointment with [facility] on Friday.    Please see A&P for status of the patient's chronic medical conditions  Past Medical History:  Diagnosis Date  . DVT (deep venous thrombosis) (HCC)   . Hyperlipidemia   . Hypertension   . Leg pain    right  . PE (pulmonary embolism)   . Sleep apnea    Review of Systems:  ROS: Pulmonary: pt denies increased work of breathing, shortness of breath,  Cardiac: pt denies palpitations, chest pain,  Abdominal: pt denies abdominal pain, nausea, vomiting, or diarrhea  Physical Exam:  Vitals:   01/19/18 1404  BP: (!) 164/79  Pulse: 85  Temp: 98.7 F (37.1 C)  TempSrc: Oral  SpO2: 97%  Weight: 215 lb 11.2 oz (97.8 kg)  Height: 5\' 3"  (1.6 m)   Physical Exam  Constitutional: No distress.  Cardiovascular: Normal rate, regular rhythm and normal heart sounds. Exam reveals no gallop and no friction rub.  No murmur heard. Pulmonary/Chest: Effort normal and breath sounds normal. No respiratory distress. She has no wheezes. She has no rales. She exhibits no tenderness.  Abdominal: Soft. Bowel sounds are normal. She exhibits no distension and no mass. There is no tenderness. There is no rebound and no guarding.  Neurological: She is alert.  Skin: She is not diaphoretic.    Social History   Socioeconomic History  . Marital status: Legally Separated    Spouse name: Not on file  . Number of children: Not on file  . Years of education: Not on file  . Highest education level: Not on file  Occupational History  . Not on file  Social Needs  . Financial resource strain: Not on file  . Food insecurity:    Worry: Not on file    Inability: Not on file  . Transportation needs:    Medical: Not on  file    Non-medical: Not on file  Tobacco Use  . Smoking status: Never Smoker  . Smokeless tobacco: Never Used  Substance and Sexual Activity  . Alcohol use: No  . Drug use: Not on file  . Sexual activity: Never  Lifestyle  . Physical activity:    Days per week: Not on file    Minutes per session: Not on file  . Stress: Not on file  Relationships  . Social connections:    Talks on phone: Not on file    Gets together: Not on file    Attends religious service: Not on file    Active member of club or organization: Not on file    Attends meetings of clubs or organizations: Not on file    Relationship status: Not on file  . Intimate partner violence:    Fear of current or ex partner: Not on file    Emotionally abused: Not on file    Physically abused: Not on file    Forced sexual activity: Not on file  Other Topics Concern  . Not on file  Social History Narrative  . Not on file    Family History  Problem Relation Age of Onset  . Hypertension Other   . Heart disease Other   . Diabetes Other   . Coronary  artery disease Other     Assessment & Plan:   See Encounters Tab for problem based charting.  Patient discussed with Dr. Oswaldo Done

## 2018-01-20 LAB — LIPID PANEL
CHOLESTEROL TOTAL: 125 mg/dL (ref 100–199)
Chol/HDL Ratio: 2.9 ratio (ref 0.0–4.4)
HDL: 43 mg/dL (ref >39)
LDL CALC: 68 mg/dL (ref 0–99)
Triglycerides: 70 mg/dL (ref 0–149)
VLDL Cholesterol Cal: 14 mg/dL (ref 5–40)

## 2018-01-20 LAB — BMP8+ANION GAP
Anion Gap: 15 mmol/L (ref 10.0–18.0)
BUN/Creatinine Ratio: 13 (ref 12–28)
BUN: 13 mg/dL (ref 8–27)
CO2: 24 mmol/L (ref 20–29)
CREATININE: 0.98 mg/dL (ref 0.57–1.00)
Calcium: 9.1 mg/dL (ref 8.7–10.3)
Chloride: 109 mmol/L — ABNORMAL HIGH (ref 96–106)
GFR calc Af Amer: 73 mL/min/{1.73_m2} (ref >59)
GFR, EST NON AFRICAN AMERICAN: 63 mL/min/{1.73_m2} (ref >59)
Glucose: 117 mg/dL — ABNORMAL HIGH (ref 65–99)
POTASSIUM: 3.5 mmol/L (ref 3.5–5.2)
SODIUM: 148 mmol/L — AB (ref 134–144)

## 2018-01-21 NOTE — Assessment & Plan Note (Addendum)
Lab Results  Component Value Date   CHOL 125 01/19/2018   HDL 43 01/19/2018   LDLCALC 68 01/19/2018   TRIG 70 01/19/2018   CHOLHDL 2.9 01/19/2018   Rechecked lipid profile on atorvastatin 20mg , she is within my goal at this dose.  -continue atorvastatin 20mg 

## 2018-01-21 NOTE — Assessment & Plan Note (Addendum)
BP Readings from Last 3 Encounters:  01/19/18 (!) 164/79  08/07/17 136/71  07/24/17 (!) 184/104   Pt was in a rush today to get things ready for her husband's birthday and unfortunately did not take her bp medicine today.  Her last visit her bp was within goal and pt takes bp at home and reports that the top number is consistently lower than 140.    -continue losartan 100mg  daily and carvedilol 6.25mg  BID will repeat bp check at next visit -pt will check bp at [facility] as well on her medicine and given return instructions -will check bmp as it has been almost one year

## 2018-01-21 NOTE — Assessment & Plan Note (Signed)
pts mammogram due, gave info for mammogram scholarship program.  Pt has appt with [facility] to get new orange card

## 2018-01-22 NOTE — Progress Notes (Signed)
Internal Medicine Clinic Attending  Case discussed with Dr. Winfrey  at the time of the visit.  We reviewed the resident's history and exam and pertinent patient test results.  I agree with the assessment, diagnosis, and plan of care documented in the resident's note.  

## 2018-01-23 ENCOUNTER — Ambulatory Visit: Payer: Self-pay

## 2018-03-06 ENCOUNTER — Other Ambulatory Visit: Payer: Self-pay | Admitting: Obstetrics and Gynecology

## 2018-03-06 DIAGNOSIS — Z1231 Encounter for screening mammogram for malignant neoplasm of breast: Secondary | ICD-10-CM

## 2018-04-02 ENCOUNTER — Ambulatory Visit (HOSPITAL_COMMUNITY): Payer: Self-pay

## 2018-04-02 ENCOUNTER — Inpatient Hospital Stay: Admission: RE | Admit: 2018-04-02 | Payer: Self-pay | Source: Ambulatory Visit

## 2018-05-20 ENCOUNTER — Telehealth: Payer: Self-pay

## 2018-05-20 NOTE — Telephone Encounter (Signed)
I think that going to the ED is the best course of action as well, thanks for your assistance.

## 2018-05-20 NOTE — Telephone Encounter (Signed)
Placed call to patient as there is no ED visit listed for today in Epic. No answer. Left message on VM requesting return call. Kinnie Feil, RN, BSN

## 2018-05-20 NOTE — Telephone Encounter (Addendum)
Returned call to patient. States she's been having SHOB x 5 days especially at night; states she has to prop up on several pillows to sleep. Also c/o right sided pain under ribs. Denies chest pain. Patient states she feels like she did when she had her last PE. Denies missing doses of xarelto. Patient wonders if IVC filter is damaged. Instructed to go directly to ED. Patient states she will. Kinnie Feil, RN, BSN

## 2018-05-20 NOTE — Telephone Encounter (Signed)
Pt states she is having SOB, please call pt back.

## 2018-05-20 NOTE — Telephone Encounter (Signed)
Patient reportedly with history of IVC filter placement in the remote past.  On chronic Xarelto, but has had difficulties affording medication in the past.  I am unsure if she has been able to take the Xarelto on a consistent basis lately.  If she has not, I would be especially concerned about a recurrent PE via a collateral and think it is very prudent for immediate evaluation in the ED.  With regards to the worsening SOB at night and the increased use of pillows this may be orthopnea/PND she is describing and could represent new onset heart failure.  This too would require a physical examination and detailed history that can also be done when seen in the ED to assess for compliance with the Xarelto and possible w/u of PE.  Of note a myoview in 2010 demonstrated an LVEF of 66%.  Bottom line is I agree with the immediate evaluation of [name] in the ED.

## 2018-05-21 ENCOUNTER — Ambulatory Visit: Payer: Self-pay | Admitting: Internal Medicine

## 2018-05-21 ENCOUNTER — Encounter (HOSPITAL_COMMUNITY): Payer: Self-pay | Admitting: General Practice

## 2018-05-21 ENCOUNTER — Observation Stay (HOSPITAL_COMMUNITY)
Admission: EM | Admit: 2018-05-21 | Discharge: 2018-05-22 | Disposition: A | Discharge status: Home / Self Care | Payer: Self-pay | Attending: Internal Medicine | Admitting: Internal Medicine

## 2018-05-21 ENCOUNTER — Encounter: Payer: Self-pay | Admitting: Internal Medicine

## 2018-05-21 ENCOUNTER — Emergency Department (HOSPITAL_COMMUNITY): Payer: Self-pay

## 2018-05-21 ENCOUNTER — Other Ambulatory Visit: Payer: Self-pay

## 2018-05-21 VITALS — BP 155/89 | HR 78 | Temp 98.1°F | Ht 63.0 in | Wt 208.6 lb

## 2018-05-21 DIAGNOSIS — E669 Obesity, unspecified: Secondary | ICD-10-CM | POA: Insufficient documentation

## 2018-05-21 DIAGNOSIS — G4733 Obstructive sleep apnea (adult) (pediatric): Secondary | ICD-10-CM | POA: Insufficient documentation

## 2018-05-21 DIAGNOSIS — Z885 Allergy status to narcotic agent status: Secondary | ICD-10-CM

## 2018-05-21 DIAGNOSIS — M549 Dorsalgia, unspecified: Secondary | ICD-10-CM

## 2018-05-21 DIAGNOSIS — Z86711 Personal history of pulmonary embolism: Secondary | ICD-10-CM | POA: Insufficient documentation

## 2018-05-21 DIAGNOSIS — I509 Heart failure, unspecified: Secondary | ICD-10-CM

## 2018-05-21 DIAGNOSIS — Z8249 Family history of ischemic heart disease and other diseases of the circulatory system: Secondary | ICD-10-CM

## 2018-05-21 DIAGNOSIS — Z86718 Personal history of other venous thrombosis and embolism: Secondary | ICD-10-CM | POA: Insufficient documentation

## 2018-05-21 DIAGNOSIS — Z6835 Body mass index (BMI) 35.0-35.9, adult: Secondary | ICD-10-CM | POA: Insufficient documentation

## 2018-05-21 DIAGNOSIS — Z95828 Presence of other vascular implants and grafts: Secondary | ICD-10-CM

## 2018-05-21 DIAGNOSIS — Z881 Allergy status to other antibiotic agents status: Secondary | ICD-10-CM

## 2018-05-21 DIAGNOSIS — I5031 Acute diastolic (congestive) heart failure: Secondary | ICD-10-CM | POA: Insufficient documentation

## 2018-05-21 DIAGNOSIS — R0602 Shortness of breath: Secondary | ICD-10-CM | POA: Insufficient documentation

## 2018-05-21 DIAGNOSIS — M545 Low back pain: Secondary | ICD-10-CM

## 2018-05-21 DIAGNOSIS — I10 Essential (primary) hypertension: Secondary | ICD-10-CM | POA: Diagnosis present

## 2018-05-21 DIAGNOSIS — Z7901 Long term (current) use of anticoagulants: Secondary | ICD-10-CM | POA: Insufficient documentation

## 2018-05-21 DIAGNOSIS — E785 Hyperlipidemia, unspecified: Secondary | ICD-10-CM | POA: Insufficient documentation

## 2018-05-21 DIAGNOSIS — I110 Hypertensive heart disease with heart failure: Principal | ICD-10-CM | POA: Insufficient documentation

## 2018-05-21 HISTORY — DX: Heart failure, unspecified: I50.9

## 2018-05-21 LAB — BASIC METABOLIC PANEL
Anion gap: 9 (ref 5–15)
BUN: 17 mg/dL (ref 6–20)
CALCIUM: 9.2 mg/dL (ref 8.9–10.3)
CO2: 21 mmol/L — AB (ref 22–32)
CREATININE: 1.11 mg/dL — AB (ref 0.44–1.00)
Chloride: 112 mmol/L — ABNORMAL HIGH (ref 98–111)
GFR calc non Af Amer: 53 mL/min — ABNORMAL LOW (ref >60)
Glucose, Bld: 108 mg/dL — ABNORMAL HIGH (ref 70–99)
Potassium: 3.9 mmol/L (ref 3.5–5.1)
SODIUM: 142 mmol/L (ref 135–145)

## 2018-05-21 LAB — MAGNESIUM: Magnesium: 1.9 mg/dL (ref 1.7–2.4)

## 2018-05-21 LAB — CBC
HCT: 47.3 % — ABNORMAL HIGH (ref 36.0–46.0)
Hemoglobin: 14.8 g/dL (ref 12.0–15.0)
MCH: 27 pg (ref 26.0–34.0)
MCHC: 31.3 g/dL (ref 30.0–36.0)
MCV: 86.3 fL (ref 78.0–100.0)
PLATELETS: 231 10*3/uL (ref 150–400)
RBC: 5.48 MIL/uL — AB (ref 3.87–5.11)
RDW: 13.2 % (ref 11.5–15.5)
WBC: 7.2 10*3/uL (ref 4.0–10.5)

## 2018-05-21 LAB — D-DIMER, QUANTITATIVE (NOT AT ARMC)

## 2018-05-21 LAB — TROPONIN I
Troponin I: <0.03 ng/mL (ref <0.03)
Troponin I: <0.03 ng/mL (ref <0.03)

## 2018-05-21 LAB — I-STAT TROPONIN, ED: TROPONIN I, POC: 0.01 ng/mL (ref 0.00–0.08)

## 2018-05-21 LAB — BRAIN NATRIURETIC PEPTIDE: B Natriuretic Peptide: 618.6 pg/mL — ABNORMAL HIGH (ref 0.0–100.0)

## 2018-05-21 MED ORDER — SODIUM CHLORIDE 0.9% FLUSH
3.0000 mL | Freq: Two times a day (BID) | INTRAVENOUS | Status: DC
  Administered 2018-05-21 – 2018-05-22 (×3): 3 mL via INTRAVENOUS

## 2018-05-21 MED ORDER — FUROSEMIDE 10 MG/ML IJ SOLN
80.0000 mg | Freq: Once | INTRAMUSCULAR | Status: AC
  Administered 2018-05-21: 80 mg via INTRAVENOUS
  Filled 2018-05-21: qty 8

## 2018-05-21 MED ORDER — RIVAROXABAN 20 MG PO TABS
20.0000 mg | ORAL_TABLET | Freq: Every day | ORAL | Status: DC
  Administered 2018-05-21: 20 mg via ORAL
  Filled 2018-05-21: qty 1

## 2018-05-21 MED ORDER — ACETAMINOPHEN 325 MG PO TABS: 650.0000 mg | ORAL_TABLET | ORAL | Status: DC | PRN

## 2018-05-21 MED ORDER — ONDANSETRON HCL 4 MG/2ML IJ SOLN: 4.0000 mg | Freq: Four times a day (QID) | INTRAMUSCULAR | Status: DC | PRN

## 2018-05-21 MED ORDER — SODIUM CHLORIDE 0.9% FLUSH: 3.0000 mL | INTRAVENOUS | Status: DC | PRN

## 2018-05-21 MED ORDER — LOSARTAN POTASSIUM 50 MG PO TABS
100.0000 mg | ORAL_TABLET | Freq: Every day | ORAL | Status: DC
  Administered 2018-05-21 – 2018-05-22 (×2): 100 mg via ORAL
  Filled 2018-05-21 (×2): qty 2

## 2018-05-21 MED ORDER — SODIUM CHLORIDE 0.9 % IV SOLN: 250.0000 mL | INTRAVENOUS | Status: DC | PRN

## 2018-05-21 MED ORDER — ATORVASTATIN CALCIUM 20 MG PO TABS
20.0000 mg | ORAL_TABLET | Freq: Every day | ORAL | Status: DC
Start: 2018-05-21 — End: 2018-05-22
  Administered 2018-05-21: 20 mg via ORAL
  Filled 2018-05-21: qty 1

## 2018-05-21 MED ORDER — CARVEDILOL 12.5 MG PO TABS
6.2500 mg | ORAL_TABLET | Freq: Two times a day (BID) | ORAL | Status: DC
  Administered 2018-05-21 – 2018-05-22 (×2): 6.25 mg via ORAL
  Filled 2018-05-21 (×2): qty 1

## 2018-05-21 NOTE — H&P (Signed)
Date: 05/21/2018               Patient Name:  [name] MRN: [id number]  DOB: 1956/07/08 Age / Sex: 62 y.o., female   PCP: Angelita Ingles, MD         Medical Service: Internal Medicine Teaching Service         Attending Physician: Dr. Rolan Bucco, MD    First Contact: Dr. Julian Hy Pager: [phone]  Second Contact: Dr. 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   She was sent to the ED from internal medicine clinic where she was seen this morning.   Ms. Gupton states that she began to have dyspnea on exertion 5-6 days ago. Her SOB progressed and she developed orthopnea. She states that the last time she was this SOB was when she had a pulmonary embolism. At that time she had an IVC placed and is on long-term Xarelto. She denies missing a dose of her anti-coagulation.   She also reports an abrupt onset of left upper and right lower back pain which began abruptly this morning. She states that the pain has been constant since onset and not worsened with movement or breathing. She denies any chest pain. She does endorse non-productive cough but reports that this has been going on for awhile.   She denies congestion, fever, abdominal pain, changes in bladder or bowel movements. She reports not taking her medicine this morning including her blood pressure medication.   In the ED, she was found to be hypertensive in the 160'-170's/90's-100's with otherwise unremarkable vital signs. Her EKG showed normal sinus rhythm without signs of acute ischemia. Troponin 0.01 x 1 and BNP: 618.6. Negative D-dimer. CXR showed enlarged cardiac markings with pulmonary vascular congestions and  minimal interstitial infiltrates consistent with pulmonary edema. She was given Lasix 80 mg IV.     Meds:  Current Meds  Medication Sig  . acetaminophen (TYLENOL) 500 MG tablet Take 500 mg by mouth every 6 (six) hours as needed for mild pain or headache.  Marland Kitchen atorvastatin (LIPITOR) 20 MG tablet Take 1 tablet (20 mg total) by mouth daily.  . carvedilol (COREG) 6.25 MG tablet TAKE 1 Tablet  BY MOUTH TWICE DAILY WITH MEALS  . losartan (COZAAR) 100 MG tablet Take 1 tablet (100 mg total) by mouth daily.  . rivaroxaban (XARELTO) 20 MG TABS tablet Take 1 tablet (20 mg total) by mouth daily with supper.     Allergies: Allergies as of 05/21/2018 - Review Complete 05/21/2018  Allergen Reaction Noted  . Tramadol Nausea And Vomiting 05/21/2018  . Ciprofloxacin Rash 06/07/2011   Past Medical History:  Diagnosis Date  . DVT (deep venous thrombosis) (HCC)   . Hyperlipidemia   . Hypertension   . Leg pain    right  . PE (pulmonary embolism)   . Sleep apnea     Family History:  Family History  Problem Relation Age of Onset  . Hypertension Other   . Heart disease Other   . Diabetes Other   . Coronary artery disease Other     Social History: She denies tobacco, alcohol, and  drug use.   Review of Systems: A complete ROS was negative except as per HPI.   Physical Exam: Blood pressure (!) 167/99, pulse 65, temperature 98 F (36.7 C), temperature source Oral, resp. rate (!) 9, SpO2 95 %.  Physical Exam  Constitutional: She appears well-developed and well-nourished.  HENT:  Head: Normocephalic and atraumatic.  Eyes: EOM are normal.  Neck: Normal range of motion.  Cardiovascular: Normal rate, regular rhythm and normal heart sounds.  No murmur heard. Pulmonary/Chest: Effort normal. She exhibits no tenderness.  Crackles heard at the right lung base.   Abdominal: Soft. Bowel sounds are normal. There is no tenderness.  Musculoskeletal: Normal range of motion.       Right lower leg:  Normal. She exhibits no tenderness and no edema.       Left lower leg: Normal. She exhibits no tenderness and no edema.  Neurological: She is alert.  Skin: Skin is warm and dry.  Psychiatric: She has a normal mood and affect. Her behavior is normal.  Vitals reviewed.   EKG: personally reviewed my interpretation is normal sinus rhythm without signs of acute ischemia.  CXR: personally reviewed my interpretation is enlarge cardiac silhouette with small interstitial infiltrates at the lung bases bilaterally.   Assessment & Plan by Problem: Principal Problem:   Acute heart failure (HCC) Active Problems:   Essential hypertension   SLEEP APNEA, OBSTRUCTIVE   History of pulmonary embolus (PE)  [name] is a 62 year old female with HTN, OSA, hx of PE on chronic Xarelto who presented with SOB and found to have CXR with vascular congestion, elevated BNP, and crackles on exam. Her symptoms are most consistent with a new diagnosis of Congestive Heart Failure and fluid overload. She is not likely to have a PE given that she is on chronic Xarelto and had a negative D-dimer. Her chest pain is less likely to be cardiac in nature given that it remains constant and has lasted for several hours. She also was found to have a negative troponin x1 and unremarkable EKG further decreasing the likelihood of a cardiac event.   Congestive Heart Failure - s/p 80 mg LASIX - Follow-up ECHO  - Trending Troponins - Continuous cardiac monitoring - Daily Weights - Strict I/O's  Essential Hypertension - Continue home carvedilol and losartan for blood pressure control  Hx of Pulmonary embolism - Continue home Xarelto 20 mg QD     Dispo: Admit patient to Inpatient with expected length of stay greater than 2 midnights.  Signed: Synetta Shadow, MD 05/21/2018, 1:11 PM  Pager: [phone]

## 2018-05-21 NOTE — Progress Notes (Signed)
Patient arrived. Ambulates well. VSS. Cardiac monitor initiated and verified.

## 2018-05-21 NOTE — Progress Notes (Signed)
Patient transported via wheelchair to ER for Shortness of breath.  Taken in by [employer] EMT. Patient remains alert and oriented. Angelina Ok, RN 05/21/2018 9:28 AM

## 2018-05-21 NOTE — ED Provider Notes (Signed)
[facility] EMERGENCY DEPARTMENT Provider Note   CSN: [id number] Arrival date & time: 05/21/18  0920     History   Chief Complaint Chief Complaint  Patient presents with  . Shortness of Breath    HPI [name] is a 62 y.o. female.  Patient is a 62 year old female with a history of hypertension, hyperlipidemia pulmonary embolus and sleep apnea who presents with shortness of breath.  She states she has a 5-day history of worsening shortness of breath.  She is short of breath on ambulation and at rest.  She has difficulty lying flat due to her shortness of breath.  She denies any significant cough or chest congestion.  No fevers.  No pain in her chest.  This morning she noticed some pain to her left upper back but it is nonpleuritic.  It is worse with movement.  She denies any leg pain or swelling.  She does have a prior history of PE and is on Xarelto long-term.  She has not missed any doses.  She was seen by her physician in the internal medicine clinic this morning and sent here for further evaluation.     Past Medical History:  Diagnosis Date  . DVT (deep venous thrombosis) (HCC)   . Hyperlipidemia   . Hypertension   . Leg pain    right  . PE (pulmonary embolism)   . Sleep apnea     Patient Active Problem List   Diagnosis Date Noted  . Shortness of breath 05/21/2018  . Tooth pain 07/24/2017  . Hemorrhage of rectum and anus 05/06/2017  . Bilateral lower extremity edema 02/04/2017  . History of pulmonary embolus (PE) 04/12/2016  . Allergic rhinitis 04/12/2016  . Healthcare maintenance 04/12/2016  . Hyperlipidemia 01/30/2009  . Obesity, Class II, BMI 35-39.9, with comorbidity (HCC) 01/30/2009  . SLEEP APNEA, OBSTRUCTIVE 01/30/2009  . Essential hypertension 01/30/2009    Past Surgical History:  Procedure Laterality Date  . ABDOMINAL HYSTERECTOMY    . APPENDECTOMY    . BACK SURGERY    . CHOLECYSTECTOMY     Gall Bladder removal  .  COLONOSCOPY WITH PROPOFOL N/A 05/06/2017   Procedure: COLONOSCOPY WITH PROPOFOL;  Surgeon: Charlott Rakes, MD;  Location: [facility];  Service: Endoscopy;  Laterality: N/A;  . LAMINOTOMY     right at L4-5 with a disc bulge and superimposed right lateral recess protrusion encroaching on the L5 root  . SPINE SURGERY     decompression surgery     OB History   None      Home Medications    Prior to Admission medications   Medication Sig Start Date End Date Taking? Authorizing Provider  acetaminophen (TYLENOL) 500 MG tablet Take 500 mg by mouth every 6 (six) hours as needed for mild pain or headache.   Yes [provider]  atorvastatin (LIPITOR) 20 MG tablet Take 1 tablet (20 mg total) by mouth daily. 06/02/17  Yes Angelita Ingles, MD  carvedilol (COREG) 6.25 MG tablet TAKE 1 Tablet  BY MOUTH TWICE DAILY WITH MEALS 09/17/17  Yes Angelita Ingles, MD  losartan (COZAAR) 100 MG tablet Take 1 tablet (100 mg total) by mouth daily. 06/02/17  Yes Angelita Ingles, MD  rivaroxaban (XARELTO) 20 MG TABS tablet Take 1 tablet (20 mg total) by mouth daily with supper. 06/02/17  Yes Angelita Ingles, MD    Family History Family History  Problem Relation Age of Onset  . Hypertension Other   .  Heart disease Other   . Diabetes Other   . Coronary artery disease Other     Social History Social History   Tobacco Use  . Smoking status: Never Smoker  . Smokeless tobacco: Never Used  Substance Use Topics  . Alcohol use: No  . Drug use: Not on file     Allergies   Tramadol and Ciprofloxacin   Review of Systems Review of Systems  Constitutional: Positive for fatigue. Negative for chills, diaphoresis and fever.  HENT: Negative for congestion, rhinorrhea and sneezing.   Eyes: Negative.   Respiratory: Positive for shortness of breath. Negative for cough and chest tightness.   Cardiovascular: Negative for chest pain and leg swelling.  Gastrointestinal: Negative for  abdominal pain, blood in stool, diarrhea, nausea and vomiting.  Genitourinary: Negative for difficulty urinating, flank pain, frequency and hematuria.  Musculoskeletal: Positive for back pain. Negative for arthralgias.  Skin: Negative for rash.  Neurological: Negative for dizziness, speech difficulty, weakness, numbness and headaches.     Physical Exam Updated Vital Signs BP (!) 167/99   Pulse 65   Temp 98 F (36.7 C) (Oral)   Resp (!) 9   SpO2 95%   Physical Exam  Constitutional: She is oriented to person, place, and time. She appears well-developed and well-nourished.  HENT:  Head: Normocephalic and atraumatic.  Eyes: Pupils are equal, round, and reactive to light.  Neck: Normal range of motion. Neck supple.  Cardiovascular: Normal rate, regular rhythm and normal heart sounds.  Pulmonary/Chest: Effort normal and breath sounds normal. No respiratory distress. She has no wheezes. She has no rales. She exhibits no tenderness.  Abdominal: Soft. Bowel sounds are normal. There is no tenderness. There is no rebound and no guarding.  Musculoskeletal: Normal range of motion. She exhibits no edema.       Right lower leg: She exhibits no tenderness and no edema.       Left lower leg: She exhibits no tenderness and no edema.  Mild pain on palpation of her left upper back in the musculature.  No spinal tenderness  Lymphadenopathy:    She has no cervical adenopathy.  Neurological: She is alert and oriented to person, place, and time.  Skin: Skin is warm and dry. No rash noted.  Psychiatric: She has a normal mood and affect.     ED Treatments / Results  Labs (all labs ordered are listed, but only abnormal results are displayed) Labs Reviewed  BASIC METABOLIC PANEL - Abnormal; Notable for the following components:      Result Value   Chloride 112 (*)    CO2 21 (*)    Glucose, Bld 108 (*)    Creatinine, Ser 1.11 (*)    GFR calc non Af Amer 53 (*)    All other components within  normal limits  CBC - Abnormal; Notable for the following components:   RBC 5.48 (*)    HCT 47.3 (*)    All other components within normal limits  BRAIN NATRIURETIC PEPTIDE - Abnormal; Notable for the following components:   B Natriuretic Peptide 618.6 (*)    All other components within normal limits  D-DIMER, QUANTITATIVE (NOT AT [facility])  I-STAT TROPONIN, ED    EKG EKG Interpretation  Date/Time:  Thursday May 21 2018 09:23:41 EDT Ventricular Rate:  69 PR Interval:  162 QRS Duration: 112 QT Interval:  454 QTC Calculation: 486 R Axis:   87 Text Interpretation:  Normal sinus rhythm Septal infarct , age undetermined ST &  T wave abnormality, consider inferior ischemia Abnormal ECG interventricular conduction delay, changed from prior EKG Confirmed by Rolan Bucco [phone]) on 05/21/2018 9:49:12 AM   Radiology Dg Chest 2 View  Result Date: 05/21/2018 CLINICAL DATA:  Shortness of breath, history hypertension, DVT/pulmonary embolism EXAM: CHEST - 2 VIEW COMPARISON:  09/13/2014 FINDINGS: Enlargement of cardiac silhouette with pulmonary vascular congestion. Mediastinal contours normal. Minimal interstitial infiltrates in both lungs new since previous exam, greater of bases, question minimal pulmonary edema. Subsegmental atelectasis at bases. No pleural effusion or pneumothorax. Bones unremarkable. IMPRESSION: Enlargement of cardiac silhouette with pulmonary vascular congestion and suspect minimal pulmonary edema. Bibasilar subsegmental atelectasis. Electronically Signed   By: Ulyses Southward M.D.   On: 05/21/2018 10:34    Procedures Procedures (including critical care time)  Medications Ordered in ED Medications  furosemide (LASIX) injection 80 mg (80 mg Intravenous Given 05/21/18 1308)     Initial Impression / Assessment and Plan / ED Course  I have reviewed the triage vital signs and the nursing notes.  Pertinent labs & imaging results that were available during my care of the patient  were reviewed by me and considered in my medical decision making (see chart for details).     Patient is a 69-year-old female who presents with shortness of breath.  Her chest x-ray shows evidence of vascular congestion.  Her BNP is elevated.  Her troponin is normal.  Her d-dimer is normal.  She has no hypoxia.  No ischemic changes on EKG.  She was given a dose of Lasix in the ED.  I spoke with the resident for the internal medicine teaching service she will admit the patient for further treatment.  Final Clinical Impressions(s) / ED Diagnoses   Final diagnoses:  Congestive heart failure, unspecified HF chronicity, unspecified heart failure type [facility])    ED Discharge Orders    None       Rolan Bucco, MD 05/21/18 1318

## 2018-05-21 NOTE — Plan of Care (Signed)
  Problem: Education: Goal: Knowledge of General Education information will improve Description: Including pain rating scale, medication(s)/side effects and non-pharmacologic comfort measures Outcome: Progressing   Problem: Clinical Measurements: Goal: Respiratory complications will improve Outcome: Progressing   

## 2018-05-21 NOTE — Patient Instructions (Signed)
Ms. Andre, We are sending you to the ED for further evaluation of your significant shortness of breath. We will follow-up accordingly once we have more information.   I wish you all the best and hope you feel better soon!

## 2018-05-21 NOTE — ED Triage Notes (Signed)
Patient sent to ED by Internal Medicine MD for further evaluation of shortness of breath x 5 days. Patient hx PE. Denies chest pain, dizziness, fevers.

## 2018-05-21 NOTE — Progress Notes (Signed)
   CC: shortness of breath  HPI:  [name] is a 62 y.o. woman with PMHx of HTN, HLD, DVT/PE who presents with 5 day history of shortness of breath. 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 Also had associated diaphoresis and sharp right sided chest pain. She states the chest pain has now moved into her left upper back. The shortness of breath has progressively worsened. Endorses significant dyspnea on minor exertion and orthopnea.  She has been on Xarelto ever since IVC filter was placed several years ago and states she has not missed any doses. She has not noticed increased frequency of stools or darkened stools, but notes she doesn't really pay attention to their color. Denies headaches, light headedness, palpitations, lower extremity edema, or weight gain.   Past Medical History:  Diagnosis Date  . DVT (deep venous thrombosis) (HCC)   . Hyperlipidemia   . Hypertension   . Leg pain    right  . PE (pulmonary embolism)   . Sleep apnea    Review of Systems:  Per HPI  Physical Exam:  Vitals:   05/21/18 0834  BP: (!) 155/89  Pulse: 78  Temp: 98.1 F (36.7 C)  TempSrc: Oral  SpO2: 100%  Weight: 208 lb 9.6 oz (94.6 kg)  Height: 5\' 3"  (1.6 m)   General: alert, well-nourished, appears stated age Neck: no JVD CV: RRR; no murmurs, rubs or gallops Pulm: dyspneic with conversation and getting up to exam table; lungs with good air movement; CTA bilaterally with no wheezes or crackles; chest wall with mild tenderness to palpation under right breast Back: left upper thoracic paraspinal muscles with significant TTP  Ext: no lower extremity edema; no calf tenderness   Assessment & Plan:   See Encounters Tab for problem based charting.  Patient seen with Dr. Heide Spark

## 2018-05-21 NOTE — Assessment & Plan Note (Addendum)
Patient presents with acute onset shortness of breath beginning 5 days ago that has progressively worsened with associated DOE and orthopnea.  She has a history of DVT/PE several years ago; at that time she had an IVC filter placed and was started on Xarelto. She is not currently hypoxic or tachycardic and endorses consistently taking her Xarelto. Given acute onset of symptoms and significant history, we are sending her to ED to evaluate for possible PE.   Symptoms of dyspnea on exertion and orthopnea could be in the setting of new onset heart failure due to longstanding HTN versus cardiac ischemia. Work-up for this can be done on ED evaluation as well.  Given long-standing use of Xarelto, she is at increased risk of GI bleed causing acute onset anemia. Lower suspicion for this, as she denies any hematochezia, hematemesis, melena or increase in stool frequency.

## 2018-05-22 ENCOUNTER — Telehealth: Payer: Self-pay | Admitting: Internal Medicine

## 2018-05-22 ENCOUNTER — Inpatient Hospital Stay (HOSPITAL_COMMUNITY): Payer: Self-pay

## 2018-05-22 DIAGNOSIS — I10 Essential (primary) hypertension: Secondary | ICD-10-CM

## 2018-05-22 LAB — ECHOCARDIOGRAM COMPLETE
Height: 63 in
Weight: 3238.4 oz

## 2018-05-22 LAB — BASIC METABOLIC PANEL
Anion gap: 10 (ref 5–15)
BUN: 21 mg/dL — ABNORMAL HIGH (ref 6–20)
CALCIUM: 9.2 mg/dL (ref 8.9–10.3)
CHLORIDE: 107 mmol/L (ref 98–111)
CO2: 24 mmol/L (ref 22–32)
CREATININE: 1.1 mg/dL — AB (ref 0.44–1.00)
GFR, EST NON AFRICAN AMERICAN: 53 mL/min — AB (ref >60)
Glucose, Bld: 106 mg/dL — ABNORMAL HIGH (ref 70–99)
POTASSIUM: 3.2 mmol/L — AB (ref 3.5–5.1)
Sodium: 141 mmol/L (ref 135–145)

## 2018-05-22 LAB — TROPONIN I: Troponin I: <0.03 ng/mL (ref <0.03)

## 2018-05-22 LAB — MAGNESIUM: MAGNESIUM: 2 mg/dL (ref 1.7–2.4)

## 2018-05-22 LAB — HIV ANTIBODY (ROUTINE TESTING W REFLEX): HIV Screen 4th Generation wRfx: NONREACTIVE

## 2018-05-22 MED ORDER — POTASSIUM CHLORIDE CRYS ER 20 MEQ PO TBCR
40.0000 meq | EXTENDED_RELEASE_TABLET | Freq: Once | ORAL | Status: DC
  Filled 2018-05-22: qty 2

## 2018-05-22 MED ORDER — FUROSEMIDE 10 MG/ML IJ SOLN
40.0000 mg | Freq: Once | INTRAMUSCULAR | Status: DC
  Filled 2018-05-22: qty 4

## 2018-05-22 MED ORDER — FUROSEMIDE 40 MG PO TABS: 40.0000 mg | ORAL_TABLET | Freq: Every day | ORAL | 2 refills | Status: DC

## 2018-05-22 MED ORDER — POTASSIUM CHLORIDE CRYS ER 20 MEQ PO TBCR
40.0000 meq | EXTENDED_RELEASE_TABLET | Freq: Two times a day (BID) | ORAL | Status: DC
  Administered 2018-05-22: 40 meq via ORAL
  Filled 2018-05-22: qty 2

## 2018-05-22 NOTE — Progress Notes (Signed)
  Echocardiogram 2D Echocardiogram has been performed.  Celene Skeen 05/22/2018, 10:45 AM

## 2018-05-22 NOTE — Care Management Note (Signed)
Case Management Note  Patient Details  Name: [name] MRN: [id number] Date of Birth: [birth year]  Subjective/Objective:   CHF                 Action/Plan: NCM spoke to pt and states she drives to her appts. She states she had the [facility] orange card to assist with meds and PCP. The orange card expired but she is in the process of getting card renewed. She receives Xarelto through [employer]. Has follow up appt scheduled to see PCP on 05/29/2018.  Expected Discharge Date:                Expected Discharge Plan:  Home/Self Care  In-House Referral:  NA  Discharge planning Services  CM Consult  Post Acute Care Choice:  NA Choice offered to:  NA  DME Arranged:  N/A DME Agency:  NA  HH Arranged:  NA HH Agency:  NA  Status of Service:  Completed, signed off  If discussed at Long Length of Stay Meetings, dates discussed:    Additional Comments:  Elliot Cousin, RN 05/22/2018, 2:16 PM

## 2018-05-22 NOTE — Discharge Instructions (Signed)
Thank you so much for allowing our team to take care of you. You should restart your carvedilol, losartan, and xarelto while back home. I also prescribed you Lasix 40 mg tablets which you will take one each day. I have set up a hospital follow-visit on Friday, October 11th at 1:15 PM.

## 2018-05-22 NOTE — Discharge Summary (Addendum)
Name: [name] MRN: [id number] DOB: 1956-07-27 62 y.o. PCP: Angelita Ingles, MD  Date of Admission: 05/21/2018  9:21 AM Date of Discharge:  Attending Physician: Gust Rung, DO  Discharge Diagnosis: 1. Congestive Heart Failure with Diastolic Dysfunction  Discharge Medications: Allergies as of 05/22/2018      Reactions   Tramadol Nausea And Vomiting   Ciprofloxacin Rash      Medication List    STOP taking these medications   acetaminophen 500 MG tablet Commonly known as:  TYLENOL     TAKE these medications   atorvastatin 20 MG tablet Commonly known as:  LIPITOR Take 1 tablet (20 mg total) by mouth daily.   carvedilol 6.25 MG tablet Commonly known as:  COREG TAKE 1 Tablet  BY MOUTH TWICE DAILY WITH MEALS   furosemide 40 MG tablet Commonly known as:  LASIX Take 1 tablet (40 mg total) by mouth daily.   losartan 100 MG tablet Commonly known as:  COZAAR Take 1 tablet (100 mg total) by mouth daily.   rivaroxaban 20 MG Tabs tablet Commonly known as:  XARELTO Take 1 tablet (20 mg total) by mouth daily with supper.       Disposition and follow-up:   [name] was discharged from [facility] in Stable condition.  At the hospital follow up visit please address:  1.  Fluid status: Ms. Hennings was discharged with Lasix 40 mg PO QD.  2.  Labs / imaging needed at time of follow-up: BMP to recheck potassium  3.  Pending labs/ test needing follow-up: HbA1c  Follow-up Appointments: Follow-up Information    [facility] Follow up.   Why:  Your hospital follow-up appointment is Friday, May 29, 2018 at 1:15 PM Contact information: [address] [place] [address] [phone]          Hospital Course by problem list: 1. [name] is a 62 year old presented with SOB and found to have CXR with vascular congestion, elevated BNP, and crackles on exam consistent with new onset  Congestive Heart Failure. She was given a total of 120 mg Lasix with good response. Prior to discharge she was net negative 1280 mL and down 2.8 kg from initial presentation. Troponin negative x3. Negative D-dimer. EKG x 2 showed normal sinus rhythm with no signs of ischemia. Echo showed EF 60-65% with grade 1 diastolic dysfunction and elevated filling pressure. Additionally, she developed right leg pain which ws most likely muscle pain secondary to hypokalemia (K 3.2). She was given a total of 80 mEq K during admission. She was discharged home with carvedilol and losartan for blood pressure control as well as 40 mg Lasix PO QD.    Discharge Vitals:   BP 128/65   Pulse 69   Temp 98.4 F (36.9 C) (Oral)   Resp 18   Ht 5\' 3"  (1.6 m)   Wt 91.8 kg Comment: scale a  SpO2 96%   BMI 35.85 kg/m   Pertinent Labs, Studies, and Procedures:   BMP Latest Ref Rng & Units 05/22/2018 05/21/2018 01/19/2018  Glucose 70 - 99 mg/dL 956(O) 130(Q) 657(Q)  BUN 6 - 20 mg/dL 46(N) 17 13  Creatinine 0.44 - 1.00 mg/dL 6.29(B) 2.84(X) 3.24  BUN/Creat Ratio 12 - 28 - - 13  Sodium 135 - 145 mmol/L 141 142 148(H)  Potassium 3.5 - 5.1 mmol/L 3.2(L) 3.9 3.5  Chloride 98 - 111 mmol/L 107 112(H) 109(H)  CO2 22 - 32 mmol/L 24 21(L) 24  Calcium 8.9 - 10.3 mg/dL 9.2 9.2 9.1   16/1/09 ECHO: Study Conclusions  - Left ventricle: The cavity size was normal. Wall thickness was   increased in a pattern of mild LVH. Systolic function was normal.   The estimated ejection fraction was in the range of 60% to 65%.   Doppler parameters are consistent with abnormal left ventricular   relaxation (grade 1 diastolic dysfunction). The E/e&' ratio is   between 8-15, suggesting indeterminate LV filling pressure. The   E/e&' ratio is >15, suggesting elevated LV filling pressure. - Mitral valve: Mildly thickened leaflets . There was trivial   regurgitation. - Left atrium: The atrium was normal in size. - Inferior vena cava: The vessel  was normal in size. The   respirophasic diameter changes were in the normal range (>= 50%),   consistent with normal central venous pressure.  Impressions:  - Compared to a prior study in 2012, the LVEF is unchanged at   60-65%, with grade 1 DD, however, LV filling pressure is   elevated.  Signed: Synetta Shadow, MD 05/22/2018, 1:58 PM   Pager: [phone]

## 2018-05-22 NOTE — Progress Notes (Signed)
   Subjective: Ms. Petrasek reports improvement in her shortness of breath and states that she walked to the bathroom without SOB. She does state that this morning she began to have right-sided leg pain. She states that she is having a hard time bearing weight on the right foot because of the pain. She cannot localize the pain stating that she feels it "all over." She denies any joint pain. She has not had a pain medication since before admission. She also continues to have the left-sided back pain which she says she will feel with fast movement. She denies any chest pain and abdominal pain.   Objective:  Vital signs in last 24 hours: Vitals:   05/21/18 1740 05/21/18 2027 05/22/18 0040 05/22/18 0447  BP: (!) 154/90 129/67 124/80 130/76  Pulse: 71 72 68 60  Resp:  18 18 18   Temp:  98.2 F (36.8 C) 98.6 F (37 C) 98.4 F (36.9 C)  TempSrc:  Oral Oral Oral  SpO2:  100% 95% 94%  Weight:   91.8 kg   Height:       Physical Exam  Constitutional: She appears well-developed and well-nourished.  HENT:  Head: Normocephalic and atraumatic.  Eyes: EOM are normal.  Neck: Normal range of motion.  Cardiovascular: Normal rate, regular rhythm, normal heart sounds and intact distal pulses.  Pulmonary/Chest: Effort normal.  Crackles heard at the lung bases bilaterally.   Abdominal: Soft. Bowel sounds are normal.  Musculoskeletal:       Right lower leg: Normal. She exhibits tenderness (Tenderness to palpation to the right anterior and posterior aspect of the leg. Pain with flexion and extension of the right foot. She has no clear deformities to the leg. Both legs are symmetric and lack erythema. Dorsalis pedis pulses palpated bilaterally). She exhibits no edema.       Left lower leg: Normal. She exhibits no edema.  Mild tenderness to palpation to the left-sided of her back.   Neurological: She is alert.  Skin: Skin is warm and dry.  Psychiatric: She has a normal mood and affect. Her behavior is  normal.  Nursing note and vitals reviewed.   Assessment/Plan:  Principal Problem:   Acute heart failure (HCC) Active Problems:   Essential hypertension   SLEEP APNEA, OBSTRUCTIVE   History of pulmonary embolus (PE)  Ms. Keshishyan is a 62 year old female who presented with signs and symptoms consistent with new onset Congestive Heart Failure. She was given 80 mg Lasix yesterday and is net negative 1280 mL. She is also down 2.8 kg from initial presentation yesterday morning. Troponin negative x3. Echo performed today and showed EF 60-65% with grade 1 diastolic dysfunction and elevated filling pressure.  CHF - s/p 80 mg IV Lasix. Gave an additional 40 mg IV Lasix today. - Will discharge home with Lasix 40 mg PO QD.  - She will follow-up with her PCP in 1 week.  Right Leg Pain: Most likely muscle pain secondary to hypokalemia (K 3.2). - s/p 40 mEq K. Additional 40 mEq K to be given at 1200. - Recommend rechecking BMP at follow-up visit.  Essential Hypertension - Continue home carvedilol and losartan for blood pressure control  Hx of Pulmonary embolism - Continue home Xarelto 20 mg QD   Dispo: Anticipated discharge today.  Synetta Shadow, MD 05/22/2018, 6:13 AM Pager: [phone]

## 2018-05-22 NOTE — Progress Notes (Signed)
Internal Medicine Clinic Attending  I saw and evaluated the patient.  I personally confirmed the key portions of the history and exam documented by Dr. Bloomfield and I reviewed pertinent patient test results.  The assessment, diagnosis, and plan were formulated together and I agree with the documentation in the resident's note.  

## 2018-05-22 NOTE — Telephone Encounter (Signed)
TOC-HFU sch for the [facility] on 05/29/2018 per Dr. Criss Alvine.

## 2018-05-23 LAB — HEMOGLOBIN A1C
HEMOGLOBIN A1C: 5.6 % (ref 4.8–5.6)
MEAN PLASMA GLUCOSE: 114 mg/dL

## 2018-05-26 ENCOUNTER — Other Ambulatory Visit: Payer: Self-pay | Admitting: Internal Medicine

## 2018-05-26 DIAGNOSIS — I10 Essential (primary) hypertension: Secondary | ICD-10-CM

## 2018-05-26 DIAGNOSIS — Z86711 Personal history of pulmonary embolism: Secondary | ICD-10-CM

## 2018-05-26 NOTE — Telephone Encounter (Signed)
Attempted to reach patient for [facility] call. No answer. Left message on VM requesting return call. Kinnie Feil, RN, BSN

## 2018-05-26 NOTE — Telephone Encounter (Signed)
refilled 

## 2018-05-27 ENCOUNTER — Telehealth: Payer: Self-pay | Admitting: Internal Medicine

## 2018-05-27 NOTE — Telephone Encounter (Signed)
Pt is calling back, pls call pt [phone]

## 2018-05-29 ENCOUNTER — Encounter: Payer: Self-pay | Admitting: Internal Medicine

## 2018-05-29 ENCOUNTER — Other Ambulatory Visit: Payer: Self-pay

## 2018-05-29 ENCOUNTER — Ambulatory Visit (INDEPENDENT_AMBULATORY_CARE_PROVIDER_SITE_OTHER): Payer: Self-pay | Admitting: Internal Medicine

## 2018-05-29 VITALS — BP 151/82 | HR 66 | Temp 97.9°F | Ht 63.0 in | Wt 208.3 lb

## 2018-05-29 DIAGNOSIS — I110 Hypertensive heart disease with heart failure: Secondary | ICD-10-CM

## 2018-05-29 DIAGNOSIS — I10 Essential (primary) hypertension: Secondary | ICD-10-CM

## 2018-05-29 DIAGNOSIS — Z7901 Long term (current) use of anticoagulants: Secondary | ICD-10-CM

## 2018-05-29 DIAGNOSIS — I5030 Unspecified diastolic (congestive) heart failure: Secondary | ICD-10-CM | POA: Insufficient documentation

## 2018-05-29 DIAGNOSIS — Z86711 Personal history of pulmonary embolism: Secondary | ICD-10-CM

## 2018-05-29 DIAGNOSIS — Z79899 Other long term (current) drug therapy: Secondary | ICD-10-CM

## 2018-05-29 MED ORDER — CARVEDILOL 6.25 MG PO TABS: 6.2500 mg | ORAL_TABLET | Freq: Two times a day (BID) | ORAL | 3 refills | Status: DC

## 2018-05-29 MED ORDER — RIVAROXABAN 20 MG PO TABS: ORAL_TABLET | ORAL | 3 refills | Status: DC

## 2018-05-29 MED ORDER — FUROSEMIDE 20 MG PO TABS: 20.0000 mg | ORAL_TABLET | Freq: Every day | ORAL | 2 refills | Status: DC

## 2018-05-29 MED ORDER — LOSARTAN POTASSIUM 100 MG PO TABS: 100.0000 mg | ORAL_TABLET | Freq: Every day | ORAL | 3 refills | Status: DC

## 2018-05-29 MED ORDER — ATORVASTATIN CALCIUM 20 MG PO TABS: 20.0000 mg | ORAL_TABLET | Freq: Every day | ORAL | 3 refills | Status: DC

## 2018-05-29 MED ORDER — SPIRONOLACTONE 25 MG PO TABS: 12.5000 mg | ORAL_TABLET | Freq: Every day | ORAL | 1 refills | Status: DC

## 2018-05-29 NOTE — Telephone Encounter (Signed)
Patient completed HFU today. L. Libra Gatz, RN, BSN   

## 2018-05-29 NOTE — Progress Notes (Signed)
Internal Medicine Clinic Attending  Case discussed with Dr. Guilloud at the time of the visit.  We reviewed the resident's history and exam and pertinent patient test results.  I agree with the assessment, diagnosis, and plan of care documented in the resident's note.  

## 2018-05-29 NOTE — Assessment & Plan Note (Addendum)
Patient is here for hospital follow-up.  She was discharged on 05/22/2018 after admission for newly diagnosed decompensated heart failure.  She presented with shortness of breath found to have pulmonary edema and elevated BNP.  Echocardiogram demonstrated EF of 60-65%, elevated left ventricular filling pressure, and grade 1 diastolic dysfunction.  She was diuresed with IV Lasix and discharged the following day.  2 days after discharge, patient was sitting in church when she developed sudden onset shortness of breath and was taken to the ER in [place].  She was again found to have pulmonary edema and given IV Lasix.  She reports blood pressure was also elevated and she was given IV antihypertensives.  Unfortunately her blood pressure dropped rapidly with both the diuretic and antihypertensive and she syncopized in the ER.  She was admitted for observation overnight and discharged the following day.  She was discharged on Lasix 20 mg daily, losartan 100 mg daily, and Coreg 6.25 mg twice daily.  Since discharge, she is been recording a log of her daily blood pressure and weights.  Weight has remained stable with 2 pound fluctuation between 204 and 206.  Weight at home this morning was 204.2 (208 on our scale).  Her blood pressure has remained elevated since discharge, with pressures in the 150s-160 systolic.  Blood pressure is elevated today in clinic, 151/82.  --Continue Lasix 20 mg daily  -Continue losartan 100 mill grams daily - Continue Coreg 6.25 mg twice daily - Start spironolactone 12.5 mg daily -Follow-up BMP  ADDENDUM: Patient has informed me that she is almost out of her prescriptions and her refill request through [facility] med assist was denied. Our clinic pharmacist contacted  med assist and was told her enrollment has expired, and needs to be renewed yearly. She was provided with paper work today. In the meantime, prescriptions were sent to [facility], all of which should be on the $4 list.

## 2018-05-29 NOTE — Assessment & Plan Note (Addendum)
Blood pressure is uncontrolled on current regimen of losartan 100 mg daily and Coreg 6.25 mg twice daily.  She is also taking Lasix 20 mg daily for newly diagnosed HFpEF.  Review of home blood pressure log reveals daily pressures in the 150s-160 systolic.  Blood pressure today in clinic is 151/82. --Start spironolactone 12.5 mg daily --Continue losartan 100 mg daily -Continue Coreg 6.25 mg twice daily -Follow-up BMP today --Follow-up 1 month for blood pressure recheck and labs  ADDENDUM: BMP with stable renal function, potassium 4.5. Called patient, left VM with results.

## 2018-05-29 NOTE — Patient Instructions (Addendum)
Ms. Delaurentis,  It was a pleasure to meet you. I am glad you are feeling better. Please continue to take all of your medicines as previously prescribed. I am adding another medicine for your blood pressure called spironolactone. Please take 12.5 mg (1/2 a tablet) once a day. Please make an appointment to come back in 1 month for BP follow up and lab check.  Please fill out your papers to renew your medication assistance with [place] med assist. I have sent your refills to [facility] in the meantime.    If you have any questions or concerns, call our clinic at [phone] or after hours call [phone] and ask for the internal medicine resident on call. Thank you!  Dr. Antony Contras

## 2018-05-29 NOTE — Assessment & Plan Note (Addendum)
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 Unfortunately she has run out of the medication and has been unable to refill her prescription. She took her last pill today. Reports her refill request was denied. [place] med assist was contacted today by our clinic pharmacist who was informed her [place] med assistance enrollment has expired and needs to be renewed once a year. She was provided with paperwork today to re-enroll. In the meantime, I provided patient with clinic samples and sent a prescription to [facility] where it should be on their $4 list. Instructed patient to call clinic if she has any difficulty obtaining medication.  --Samples of xarelto 20 mg were given to the patient, quantity 14 tabs (2 bottles), Lot Number [id number]. --Sent prescription to [facility] --Patient provided with [place] assist paperwork to renew enrollment

## 2018-05-29 NOTE — Telephone Encounter (Signed)
Call was for TOC. Patient completed HFU today. Kinnie Feil, RN, BSN

## 2018-05-29 NOTE — Progress Notes (Signed)
   CC: HFpEF follow up  HPI:  [name] is a 62 y.o. female with past medical history outlined below here for HFpEF follow up. For the details of today's visit, please refer to the assessment and plan.  Past Medical History:  Diagnosis Date  . Acute heart failure (HCC) 05/21/2018  . DVT (deep venous thrombosis) (HCC)   . Hyperlipidemia   . Hypertension   . Leg pain    right  . PE (pulmonary embolism)   . Sleep apnea     Review of Systems  Respiratory: Negative for shortness of breath.   Cardiovascular: Negative for chest pain.    Physical Exam:  Vitals:   05/29/18 1319  BP: (!) 151/82  Pulse: 66  Temp: 97.9 F (36.6 C)  TempSrc: Oral  SpO2: 98%  Weight: 208 lb 4.8 oz (94.5 kg)  Height: 5\' 3"  (1.6 m)    Constitutional: NAD, appears comfortable Cardiovascular: RRR, no murmurs, rubs, or gallops.  Pulmonary/Chest: CTAB, no wheezes, rales, or rhonchi.  Extremities: Warm and well perfused. No edema.  Psychiatric: Normal mood and affect  Assessment & Plan:   See Encounters Tab for problem based charting.  Patient discussed with Dr. Oswaldo Done

## 2018-05-30 LAB — BMP8+ANION GAP
ANION GAP: 15 mmol/L (ref 10.0–18.0)
BUN/Creatinine Ratio: 14 (ref 12–28)
BUN: 15 mg/dL (ref 8–27)
CO2: 21 mmol/L (ref 20–29)
CREATININE: 1.07 mg/dL — AB (ref 0.57–1.00)
Calcium: 9 mg/dL (ref 8.7–10.3)
Chloride: 107 mmol/L — ABNORMAL HIGH (ref 96–106)
GFR calc Af Amer: 65 mL/min/{1.73_m2} (ref >59)
GFR calc non Af Amer: 57 mL/min/{1.73_m2} — ABNORMAL LOW (ref >59)
Glucose: 91 mg/dL (ref 65–99)
POTASSIUM: 4.5 mmol/L (ref 3.5–5.2)
SODIUM: 143 mmol/L (ref 134–144)

## 2018-06-29 ENCOUNTER — Other Ambulatory Visit: Payer: Self-pay

## 2018-06-29 ENCOUNTER — Encounter: Payer: Self-pay | Admitting: Internal Medicine

## 2018-06-29 ENCOUNTER — Ambulatory Visit (INDEPENDENT_AMBULATORY_CARE_PROVIDER_SITE_OTHER): Payer: Self-pay | Admitting: Internal Medicine

## 2018-06-29 VITALS — BP 145/74 | HR 53 | Temp 98.3°F | Wt 204.4 lb

## 2018-06-29 DIAGNOSIS — I10 Essential (primary) hypertension: Secondary | ICD-10-CM

## 2018-06-29 DIAGNOSIS — I110 Hypertensive heart disease with heart failure: Secondary | ICD-10-CM

## 2018-06-29 DIAGNOSIS — I5030 Unspecified diastolic (congestive) heart failure: Secondary | ICD-10-CM

## 2018-06-29 DIAGNOSIS — I5032 Chronic diastolic (congestive) heart failure: Secondary | ICD-10-CM

## 2018-06-29 DIAGNOSIS — Z79899 Other long term (current) drug therapy: Secondary | ICD-10-CM

## 2018-06-29 DIAGNOSIS — Z7901 Long term (current) use of anticoagulants: Secondary | ICD-10-CM

## 2018-06-29 DIAGNOSIS — E785 Hyperlipidemia, unspecified: Secondary | ICD-10-CM

## 2018-06-29 DIAGNOSIS — Z86718 Personal history of other venous thrombosis and embolism: Secondary | ICD-10-CM

## 2018-06-29 DIAGNOSIS — Z86711 Personal history of pulmonary embolism: Secondary | ICD-10-CM

## 2018-06-29 MED ORDER — ATORVASTATIN CALCIUM 40 MG PO TABS: 40.0000 mg | ORAL_TABLET | Freq: Every day | ORAL | 3 refills | Status: DC

## 2018-06-29 MED FILL — ATORVASTATIN 40 MG TABLET: 40 | 30 days supply | Qty: 30 | Fill #0

## 2018-06-29 NOTE — Assessment & Plan Note (Addendum)
BP Readings from Last 3 Encounters:  06/29/18 (!) 145/74  05/29/18 (!) 151/82  05/22/18 128/65   BP better but still elevated above goal.  Spironolactone added last visit 12.5mg .  Also on max dose losartan and 6.25mg  BID carvedilol.    -check bmp today, may increase spiro dose if K is low enough.   -If not will consider isosorbide/hydral

## 2018-06-29 NOTE — Patient Instructions (Addendum)
Ms. Gudger, you are doing well.  We will get some labs today and we may be able to increase your spironolactone dosage to help with your blood pressure.  We will give you some xarelto samples today and will continue working with the [employer] program.  Your heart failure looks to be well controlled today continue the good work.  We will increase your atorvastatin to 40mg  daily.

## 2018-06-29 NOTE — Assessment & Plan Note (Addendum)
Patient with recent hospitilization where she was short of breath and volume overloaded diagnosed with HFpEF here at [facility] early October and diuresed.  One week later seen in [place] for acute on chronic HFpEF and diuresed d/ced and enrolled in their AHF clinic.  Seen in [facility] shortly after that .  At the [facility] visit she was felt to be euvolemic and home weights were stable per the patient.  Her Dyspnea has been stable since discharge, made it to the elevator from the valet and had to stop and catch her breath but no chest pain.  Has been following with [facility] at [facility] they sent me her records.  Weight log shows stability in her weight and symptoms.  Has occasional chest discomfort, she is not sure if it occurs with exertion but it improves with rest.  Usually lasts about a minute.   [facility] appt Wednesday. Weight 201lbs at home consistent with the log she brought in.  CPAP not really an option with no insurance. She was recommended to establish with a cardiologist through the AHF program there.  She would rather be seen here once she gets insurance.  Either option is reasonable.  I am concerned with her symptoms and reviewed her ECHO and ecg there has been change in the ecg since prior some signs of possible chronic ischemia.   -will increase her lipitor to 40mg  daily -will check bmp if potassium is low enough will increase her spironolactone dose, HR won't tolerate higher carvedilol, on max losartan -on xarelto for recurrent DVT/PE won't start aspirin until CAD is confirmed

## 2018-06-29 NOTE — Progress Notes (Signed)
   RU:EAVWU, HLD  HPI:  Ms.Jerrye L Wahid is a 62 y.o. female with PMH below.  Today we will address HFpEF, HLD  Please see A&P for status of the patient's chronic medical conditions  Past Medical History:  Diagnosis Date  . Acute heart failure (HCC) 05/21/2018  . DVT (deep venous thrombosis) (HCC)   . Hyperlipidemia   . Hypertension   . Leg pain    right  . PE (pulmonary embolism)   . Sleep apnea    Review of Systems:  ROS: Pulmonary: pt denies increased work of breathing, shortness of breath,  Cardiac: pt denies palpitations, does report occasional chest discomfort lasting about one minute relieved with rest  Abdominal: pt denies abdominal pain, nausea, vomiting, or diarrhea   Physical Exam:  Vitals:   06/29/18 1336  BP: (!) 145/74  Pulse: (!) 53  Temp: 98.3 F (36.8 C)  TempSrc: Oral  SpO2: 99%  Weight: 204 lb 6.4 oz (92.7 kg)   Cardiac: no JVD, bradycardic with normal rhythm, clear s1 and s2, no MRG Pulmonary: CTAB, not in distress Abdominal: non distended abdomen, soft and nontender Extremities: no LE edema Psych: Alert, conversant, in good spirits   Social History   Socioeconomic History  . Marital status: Legally Separated    Spouse name: Not on file  . Number of children: Not on file  . Years of education: Not on file  . Highest education level: Not on file  Occupational History  . Not on file  Social Needs  . Financial resource strain: Not on file  . Food insecurity:    Worry: Not on file    Inability: Not on file  . Transportation needs:    Medical: Not on file    Non-medical: Not on file  Tobacco Use  . Smoking status: Never Smoker  . Smokeless tobacco: Never Used  Substance and Sexual Activity  . Alcohol use: No  . Drug use: Not on file  . Sexual activity: Not Currently  Lifestyle  . Physical activity:    Days per week: Not on file    Minutes per session: Not on file  . Stress: Not on file  Relationships  . Social connections:     Talks on phone: Not on file    Gets together: Not on file    Attends religious service: Not on file    Active member of club or organization: Not on file    Attends meetings of clubs or organizations: Not on file    Relationship status: Not on file  . Intimate partner violence:    Fear of current or ex partner: Not on file    Emotionally abused: Not on file    Physically abused: Not on file    Forced sexual activity: Not on file  Other Topics Concern  . Not on file  Social History Narrative  . Not on file    Family History  Problem Relation Age of Onset  . Hypertension Other   . Heart disease Other   . Diabetes Other   . Coronary artery disease Other     Assessment & Plan:   See Encounters Tab for problem based charting.  Patient discussed with Dr. Criselda Peaches

## 2018-06-29 NOTE — Assessment & Plan Note (Signed)
Lab Results  Component Value Date   CHOL 125 01/19/2018   HDL 43 01/19/2018   LDLCALC 68 01/19/2018   TRIG 70 01/19/2018   CHOLHDL 2.9 01/19/2018   -Will increase atorvastatin to 40mg  would like to have her on high intensity statin given history, comorbidity and concern for CAD.

## 2018-06-30 ENCOUNTER — Telehealth: Payer: Self-pay | Admitting: Internal Medicine

## 2018-06-30 DIAGNOSIS — I5030 Unspecified diastolic (congestive) heart failure: Secondary | ICD-10-CM

## 2018-06-30 DIAGNOSIS — I10 Essential (primary) hypertension: Secondary | ICD-10-CM

## 2018-06-30 LAB — CMP14 + ANION GAP
ALT: 17 IU/L (ref 0–32)
ANION GAP: 17 mmol/L (ref 10.0–18.0)
AST: 18 IU/L (ref 0–40)
Albumin/Globulin Ratio: 1.7 (ref 1.2–2.2)
Albumin: 4.3 g/dL (ref 3.6–4.8)
Alkaline Phosphatase: 129 IU/L — ABNORMAL HIGH (ref 39–117)
BUN/Creatinine Ratio: 12 (ref 12–28)
BUN: 13 mg/dL (ref 8–27)
Bilirubin Total: 0.7 mg/dL (ref 0.0–1.2)
CALCIUM: 9.3 mg/dL (ref 8.7–10.3)
CHLORIDE: 107 mmol/L — AB (ref 96–106)
CO2: 20 mmol/L (ref 20–29)
Creatinine, Ser: 1.05 mg/dL — ABNORMAL HIGH (ref 0.57–1.00)
GFR calc non Af Amer: 58 mL/min/{1.73_m2} — ABNORMAL LOW (ref >59)
GFR, EST AFRICAN AMERICAN: 67 mL/min/{1.73_m2} (ref >59)
GLUCOSE: 88 mg/dL (ref 65–99)
Globulin, Total: 2.5 g/dL (ref 1.5–4.5)
POTASSIUM: 4.5 mmol/L (ref 3.5–5.2)
Sodium: 144 mmol/L (ref 134–144)
TOTAL PROTEIN: 6.8 g/dL (ref 6.0–8.5)

## 2018-06-30 LAB — FERRITIN: FERRITIN: 284 ng/mL — AB (ref 15–150)

## 2018-06-30 LAB — IRON AND TIBC
IRON: 105 ug/dL (ref 27–159)
Iron Saturation: 41 % (ref 15–55)
TIBC: 255 ug/dL (ref 250–450)
UIBC: 150 ug/dL (ref 131–425)

## 2018-06-30 MED ORDER — AMLODIPINE BESYLATE 5 MG PO TABS: 5.0000 mg | ORAL_TABLET | Freq: Every day | ORAL | 5 refills | Status: DC

## 2018-06-30 NOTE — Telephone Encounter (Signed)
left message detailing lab results and starting amlodipine 5mg  daily.  I saw that in the past the patient had some mild ankle swelling with this medicine.  I think a lot of that was undiagnosed HFpEF.  I think it is worth a trial to see if we get some bp reduction.  We can always d/c if pt feels her ankles begin to swell.

## 2018-07-01 ENCOUNTER — Ambulatory Visit: Payer: Self-pay

## 2018-07-01 ENCOUNTER — Ambulatory Visit: Payer: Self-pay | Admitting: Pharmacist

## 2018-07-01 DIAGNOSIS — Z86711 Personal history of pulmonary embolism: Secondary | ICD-10-CM

## 2018-07-01 MED ORDER — RIVAROXABAN 20 MG PO TABS: ORAL_TABLET | ORAL | 3 refills | Status: DC

## 2018-07-01 MED ORDER — CARVEDILOL 6.25 MG PO TABS: 6.2500 mg | ORAL_TABLET | Freq: Two times a day (BID) | ORAL | 3 refills | Status: DC

## 2018-07-01 MED ORDER — FUROSEMIDE 20 MG PO TABS: 20.0000 mg | ORAL_TABLET | Freq: Every day | ORAL | 0 refills | Status: DC

## 2018-07-01 MED ORDER — EPLERENONE 25 MG PO TABS: 12.5000 mg | ORAL_TABLET | Freq: Every day | ORAL | 3 refills | Status: DC

## 2018-07-01 MED ORDER — RIVAROXABAN 20 MG PO TABS: ORAL_TABLET | ORAL | 0 refills | Status: DC

## 2018-07-01 MED ORDER — LOSARTAN POTASSIUM 100 MG PO TABS: 100.0000 mg | ORAL_TABLET | Freq: Every day | ORAL | 3 refills | Status: DC

## 2018-07-01 MED ORDER — ATORVASTATIN CALCIUM 40 MG PO TABS: 40.0000 mg | ORAL_TABLET | Freq: Every day | ORAL | 3 refills | Status: DC

## 2018-07-01 MED FILL — XARELTO 20 MG TABLET: 20 | 30 days supply | Qty: 30 | Fill #0

## 2018-07-02 NOTE — Progress Notes (Signed)
Internal Medicine Clinic Attending  Case discussed with Dr. Winfrey  at the time of the visit.  We reviewed the resident's history and exam and pertinent patient test results.  I agree with the assessment, diagnosis, and plan of care documented in the resident's note.  

## 2018-07-10 ENCOUNTER — Other Ambulatory Visit (HOSPITAL_COMMUNITY): Payer: Self-pay | Admitting: *Deleted

## 2018-07-10 DIAGNOSIS — Z1231 Encounter for screening mammogram for malignant neoplasm of breast: Secondary | ICD-10-CM

## 2018-07-15 ENCOUNTER — Encounter: Payer: Self-pay | Admitting: Pharmacist

## 2018-07-15 NOTE — Progress Notes (Signed)
[facility], she should receive all of her listed medications in the mail within the next week.

## 2018-07-20 ENCOUNTER — Encounter: Payer: Self-pay | Admitting: Internal Medicine

## 2018-07-20 ENCOUNTER — Ambulatory Visit (INDEPENDENT_AMBULATORY_CARE_PROVIDER_SITE_OTHER): Payer: Self-pay | Admitting: Internal Medicine

## 2018-07-20 ENCOUNTER — Other Ambulatory Visit: Payer: Self-pay

## 2018-07-20 VITALS — BP 141/70 | HR 62 | Temp 97.8°F | Ht 63.0 in | Wt 208.0 lb

## 2018-07-20 DIAGNOSIS — I10 Essential (primary) hypertension: Secondary | ICD-10-CM

## 2018-07-20 DIAGNOSIS — Z79899 Other long term (current) drug therapy: Secondary | ICD-10-CM

## 2018-07-20 DIAGNOSIS — I110 Hypertensive heart disease with heart failure: Secondary | ICD-10-CM

## 2018-07-20 DIAGNOSIS — I5030 Unspecified diastolic (congestive) heart failure: Secondary | ICD-10-CM

## 2018-07-20 MED ORDER — FUROSEMIDE 20 MG PO TABS: 20.0000 mg | ORAL_TABLET | Freq: Every day | ORAL | 4 refills | Status: DC

## 2018-07-20 MED FILL — AMLODIPINE BESYLATE 5 MG TA: 5 | 30 days supply | Qty: 30 | Fill #0

## 2018-07-20 MED FILL — FUROSEMIDE 20 MG TABS: 20 | 30 days supply | Qty: 30 | Fill #0

## 2018-07-20 NOTE — Progress Notes (Signed)
   CC: follow up HFpEF, HTN  HPI:  Ms.Maha L Jiles ProwsMcAllister is a 62 y.o. female with PMH below.  Today we will address HFpEF, HTN.     Please see A&P for status of the patient's chronic medical conditions  Past Medical History:  Diagnosis Date  . Acute heart failure (HCC) 05/21/2018  . DVT (deep venous thrombosis) (HCC)   . Hemorrhage of rectum and anus 05/06/2017  . Hyperlipidemia   . Hypertension   . Leg pain    right  . PE (pulmonary embolism)   . Sleep apnea    Review of Systems:  ROS: Pulmonary: pt denies increased work of breathing, stable shortness of breath with exertion long distances  Cardiac: pt denies palpitations, chest pain,  Abdominal: pt denies abdominal pain, nausea, vomiting, or diarrhea   Physical Exam:  Vitals:   07/20/18 1319  BP: (!) 141/70  Pulse: 62  Temp: 97.8 F (36.6 C)  TempSrc: Oral  SpO2: 97%  Weight: 208 lb (94.3 kg)  Height: 5\' 3"  (1.6 m)   Cardiac: normal rate and rhythm, clear s1 and s2, no murmurs, rubs or gallops Pulmonary: CTAB, not in distress Abdominal: non distended abdomen, soft and nontender Extremities: no LE edema Psych: Alert, conversant, in good spirits   Social History   Socioeconomic History  . Marital status: Legally Separated    Spouse name: Not on file  . Number of children: Not on file  . Years of education: Not on file  . Highest education level: Not on file  Occupational History  . Not on file  Social Needs  . Financial resource strain: Not on file  . Food insecurity:    Worry: Not on file    Inability: Not on file  . Transportation needs:    Medical: Not on file    Non-medical: Not on file  Tobacco Use  . Smoking status: Never Smoker  . Smokeless tobacco: Never Used  Substance and Sexual Activity  . Alcohol use: No  . Drug use: Not on file  . Sexual activity: Not Currently  Lifestyle  . Physical activity:    Days per week: Not on file    Minutes per session: Not on file  . Stress: Not on  file  Relationships  . Social connections:    Talks on phone: Not on file    Gets together: Not on file    Attends religious service: Not on file    Active member of club or organization: Not on file    Attends meetings of clubs or organizations: Not on file    Relationship status: Not on file  . Intimate partner violence:    Fear of current or ex partner: Not on file    Emotionally abused: Not on file    Physically abused: Not on file    Forced sexual activity: Not on file  Other Topics Concern  . Not on file  Social History Narrative  . Not on file    Family History  Problem Relation Age of Onset  . Hypertension Other   . Heart disease Other   . Diabetes Other   . Coronary artery disease Other     Assessment & Plan:   See Encounters Tab for problem based charting.  Patient discussed with Dr. Cleda DaubE. Hoffman

## 2018-07-20 NOTE — Assessment & Plan Note (Signed)
Her blood pressure was still not well controlled at our last visit so I added amlodipine 5mg  to her regimen of losartan 100, lasix 20, carvedilol 6.25, eplerenone 25.  Unfortunately patient did not pick up amlodipine from the pharmacy.  -encouraged pt to pick up amlodipine

## 2018-07-20 NOTE — Assessment & Plan Note (Addendum)
Her Dyspnea has been stable since our last visit.  Has been following with AHF at [facility] they sent me her records.  Weight log shows stability in her weight and symptoms she weighs with no shoes or clothing brought a log with last 3 entries:12/2 202.2lb, 12/1 201lb, 11/30 202lb.  At our last visit there was some concern for possible chest pain symptoms they had both typical and atypical features and were infrequent. Still has to stop and rest when walking long distances. Today she denies any chest discomfort symptoms since our last visit.Marland Kitchen. Her blood pressure was still not well controlled at our last visit so I added amlodipine 5mg  to her regimen of losartan 100, lasix 20, carvedilol 6.25, eplerenone 25.  Unfortunately patient did not pick up amlodipine from the pharmacy.  -encouraged pt to pick up amlodipine and increased dosage of lipitor today -encouraged her to complete her [facility] paper work

## 2018-07-20 NOTE — Patient Instructions (Signed)
[name] good to see you today.  We will have you start that amlodipine 5mg  and increased atorvastatin dose 40mg .  I will see you in about 3 months to check your labs.

## 2018-07-23 NOTE — Progress Notes (Signed)
Internal Medicine Clinic Attending  Case discussed with Dr. Winfrey  at the time of the visit.  We reviewed the resident's history and exam and pertinent patient test results.  I agree with the assessment, diagnosis, and plan of care documented in the resident's note.  

## 2018-08-14 MED FILL — AMLODIPINE BESYLATE 5 MG TA: 5 | 30 days supply | Qty: 30 | Fill #1

## 2018-09-15 MED FILL — AMLODIPINE BESYLATE 5 MG TA: 5 | 30 days supply | Qty: 30 | Fill #2

## 2018-09-21 ENCOUNTER — Encounter: Payer: Self-pay | Admitting: *Deleted

## 2018-10-08 ENCOUNTER — Ambulatory Visit (HOSPITAL_COMMUNITY): Payer: Self-pay

## 2018-10-12 MED FILL — AMLODIPINE BESYLATE 5 MG TA: 5 | 30 days supply | Qty: 30 | Fill #3

## 2018-10-13 ENCOUNTER — Other Ambulatory Visit: Payer: Self-pay | Admitting: Internal Medicine

## 2018-10-13 DIAGNOSIS — I5030 Unspecified diastolic (congestive) heart failure: Secondary | ICD-10-CM

## 2018-10-13 NOTE — Telephone Encounter (Signed)
refilled 

## 2018-10-14 ENCOUNTER — Other Ambulatory Visit: Payer: Self-pay

## 2018-10-14 ENCOUNTER — Ambulatory Visit: Payer: Self-pay | Admitting: Internal Medicine

## 2018-10-14 VITALS — BP 117/59 | HR 53 | Temp 97.9°F | Wt 202.5 lb

## 2018-10-14 DIAGNOSIS — R42 Dizziness and giddiness: Secondary | ICD-10-CM | POA: Insufficient documentation

## 2018-10-14 DIAGNOSIS — E860 Dehydration: Secondary | ICD-10-CM

## 2018-10-14 NOTE — Progress Notes (Deleted)
   CC: ***  HPI:  [name] is a 63 y.o. female with past medical history outlined below here for ***. For the details of today's visit, please refer to the assessment and plan.  Past Medical History:  Diagnosis Date  . Acute heart failure (HCC) 05/21/2018  . DVT (deep venous thrombosis) (HCC)   . Hemorrhage of rectum and anus 05/06/2017  . Hyperlipidemia   . Hypertension   . Leg pain    right  . PE (pulmonary embolism)   . Sleep apnea     ROS  Physical Exam:  Vitals:   10/14/18 1401 10/14/18 1402 10/14/18 1403 10/14/18 1404  BP: (!) 117/43 125/73 (!) 141/75 (!) 117/59  Pulse: (!) 51 (!) 50 (!) 55 (!) 53  Temp: 97.9 F (36.6 C)     SpO2: 98%     Weight: 202 lb 8 oz (91.9 kg)       Constitutional: NAD, appears comfortable HEENT: PERRL, NCAT Neck: Supple, trachea midline. No lymphadenopathy.  Cardiovascular: RRR, no m/r/g Pulmonary/Chest: CTAB, no wheezes, rales, or rhonchi.  Abdominal: Soft, non tender, non distended. +BS.  GU: ***  Extremities: Warm and well perfused. No edema.  Neurological: A&Ox3, CN II - XII grossly intact.  Skin: No rashes or erythema  Psychiatric: Normal mood and affect  Assessment & Plan:   See Encounters Tab for problem based charting.  Patient {GC/GE:3044014::"discussed with","seen with"} Dr. {NAMES:3044014::"Butcher","Granfortuna","E. Hoffman","Klima","Mullen","Narendra","Raines","Vincent"}

## 2018-10-14 NOTE — Patient Instructions (Signed)
Ms. Thilges,   Your dizziness is due to dehydration because of your recent cold symptoms last week.  I recommend you hold taking Lasix for the last 2 to 3 days.  Once you see your weight back to normal between 201-203 pounds you can restart taking Lasix at the usual dose.  I expect your symptoms to resolve after this.  If they do not, please give [place] and call let us know.  -Dr. Evelene Croon

## 2018-10-15 ENCOUNTER — Encounter: Payer: Self-pay | Admitting: Internal Medicine

## 2018-10-15 LAB — BMP8+ANION GAP
ANION GAP: 17 mmol/L (ref 10.0–18.0)
BUN/Creatinine Ratio: 13 (ref 12–28)
BUN: 14 mg/dL (ref 8–27)
CO2: 21 mmol/L (ref 20–29)
Calcium: 9.2 mg/dL (ref 8.7–10.3)
Chloride: 105 mmol/L (ref 96–106)
Creatinine, Ser: 1.06 mg/dL — ABNORMAL HIGH (ref 0.57–1.00)
GFR calc Af Amer: 66 mL/min/{1.73_m2} (ref >59)
GFR, EST NON AFRICAN AMERICAN: 57 mL/min/{1.73_m2} — AB (ref >59)
GLUCOSE: 101 mg/dL — AB (ref 65–99)
POTASSIUM: 4.1 mmol/L (ref 3.5–5.2)
SODIUM: 143 mmol/L (ref 134–144)

## 2018-10-15 NOTE — Addendum Note (Signed)
Addended by: Doneen Poisson D on: 10/15/2018 01:18 PM   Modules accepted: Level of Service

## 2018-10-15 NOTE — Assessment & Plan Note (Signed)
[name] presents with episodes of intermittent dizziness that she describes as a room spinning sensation. These are associated with positional changes, especially when she sits up, that last for 1-3 minutes. She denies auditory symptoms. She does report having cold symptoms last week during which time she had poor PO intake. She continued taking her Lasix. On exam, she has dry mucus membranes and decreased skin turgor. She is orthostatic from sitting to standing. She also has bilateral nystagmus without visual changes. She brings her daily weight log today. Dry weight appears to be 200-203 lbs, but she has been in the 192-196 since last week when she felt ill. Her weight this AM was 198.    Her dizziness may be multifactorial from orthostasis secondary to dehydration vs BPPV. I asked her to hold her Lasix for 2-3 days until she is at her dry weight of 200-203 lbs. If ongoing symptoms after this would favor BPPV given nystagmus on exam. Vestibular neuritis also in the differential given recent illness though would expect nystagmus to be unilateral.

## 2018-10-15 NOTE — Progress Notes (Signed)
Patient ID: [name], female   DOB: 03-29-1956, 63 y.o.   MRN: [id number]  Case discussed with Dr. Lovenia Kim at the time of the visit.  We reviewed the resident's history and exam and pertinent patient test results.  I agree with the assessment, diagnosis and plan of care documented in the resident's note.

## 2018-10-15 NOTE — Progress Notes (Signed)
   CC: Dizziness  HPI:  [name] is a 63 y.o. year-old female with PMH listed below who presents to clinic for evaluation of dizziness. Please see problem based assessment and plan for further details.   Past Medical History:  Diagnosis Date  . Acute heart failure (HCC) 05/21/2018  . DVT (deep venous thrombosis) (HCC)   . Hemorrhage of rectum and anus 05/06/2017  . Hyperlipidemia   . Hypertension   . Leg pain    right  . PE (pulmonary embolism)   . Sleep apnea    Review of Systems:   Review of Systems  Constitutional: Negative for chills, fever and malaise/fatigue.  HENT: Negative for ear pain, hearing loss and tinnitus.   Eyes: Negative for blurred vision and double vision.  Musculoskeletal: Negative for falls.  Neurological: Positive for dizziness. Negative for focal weakness and headaches.    Physical Exam: Vitals:   10/14/18 1401 10/14/18 1402 10/14/18 1403 10/14/18 1404  BP: (!) 117/43 125/73 (!) 141/75 (!) 117/59  Pulse: (!) 51 (!) 50 (!) 55 (!) 53  Temp: 97.9 F (36.6 C)     SpO2: 98%     Weight: 202 lb 8 oz (91.9 kg)       General: well-appearing female in no acute distress  HENT: EOM clear without erythema or exudates and pearly white TM, dry MM  Eyes: bilateral nystagmus, PERRL Cardiac: regular rate and rhythm, nl S1/S2, no murmurs, rubs or gallops  Pulm: CTAB, no wheezes or crackles, no increased work of breathing on room air  Neuro: A&Ox3, CN II-XII intact, sensation and strength intact in all four extremities  Ext: warm and well perfused, no peripheral edema, decreased skin turgor     Assessment & Plan:   See Encounters Tab for problem based charting.  Patient discussed with Dr. Josem Kaufmann

## 2018-10-19 ENCOUNTER — Encounter: Payer: Self-pay | Admitting: Internal Medicine

## 2018-11-09 ENCOUNTER — Encounter: Payer: Self-pay | Admitting: Internal Medicine

## 2018-11-16 ENCOUNTER — Other Ambulatory Visit: Payer: Self-pay | Admitting: Internal Medicine

## 2018-11-16 DIAGNOSIS — I5030 Unspecified diastolic (congestive) heart failure: Secondary | ICD-10-CM

## 2018-11-16 DIAGNOSIS — I10 Essential (primary) hypertension: Secondary | ICD-10-CM

## 2018-11-16 MED ORDER — AMLODIPINE BESYLATE 5 MG PO TABS: 5.0000 mg | ORAL_TABLET | Freq: Every day | ORAL | 5 refills | Status: DC

## 2018-11-16 MED FILL — AMLODIPINE BESYLATE 5 MG TA: 5 | 30 days supply | Qty: 30 | Fill #0

## 2018-11-16 NOTE — Telephone Encounter (Signed)
Call made to patient to make her aware that she will now be using [facility] for rx.Natalie Spittle Cassady3/30/202011:58 AM

## 2018-11-16 NOTE — Telephone Encounter (Signed)
Refill Request.  Pt took her last pill today   amLODipine (NORVASC) 5 MG tablet  [facility] - [place], [place] - [address]

## 2018-11-16 NOTE — Telephone Encounter (Signed)
refilled 

## 2018-12-14 ENCOUNTER — Other Ambulatory Visit: Payer: Self-pay

## 2018-12-14 ENCOUNTER — Telehealth: Payer: Self-pay | Admitting: Internal Medicine

## 2018-12-14 ENCOUNTER — Ambulatory Visit: Payer: Self-pay | Admitting: Internal Medicine

## 2018-12-14 MED FILL — AMLODIPINE BESYLATE 5 MG TA: 5 | 30 days supply | Qty: 30 | Fill #1

## 2018-12-14 NOTE — Telephone Encounter (Signed)
No answer left pt message

## 2018-12-14 NOTE — Progress Notes (Deleted)
   [facility] Res[facility] Telephone Encounter  Reason for call:   This telephone encounter was created for [name] on 12/14/2018 for the following purpose/cc HFpEF.   Pertinent Data:   ***  ROS ***   Assessment / Plan / Recommendations:   HFpEF  HTN  As always, pt is advised that if symptoms worsen or new symptoms arise, they should go to an urgent care facility or to to ER for further evaluation.   Consent and Medical Decision Making:   Patient {GC/GE:3044014::"discussed with","seen with"} Dr. {NAMES:3044014::"Butcher","Granfortuna","E. Hoffman","Klima","Mullen","Narendra","Raines","Vincent"}  This is a telephone encounter between [name] and Thornell Mule on 12/14/2018 for ***. The visit was conducted with the patient located at {NAMES:3044014::"home"} and Thornell Mule at {NAMES:3044014::"[facility]","Home"}. The patient's identity was confirmed using their DOB and current address. The {WHO:3044014::"patient","his/her legal guardian","***"} has consented to being evaluated through a telephone encounter and understands the associated risks (an examination cannot be done and the patient may need to come in for an appointment) / benefits (allows the patient to remain at home, decreasing exposure to coronavirus). I personally spent {Numbers; 0-31:[id number]} minutes on medical discussion.

## 2018-12-14 NOTE — Telephone Encounter (Signed)
attempt 2 left message will try and reschedule appt

## 2018-12-28 ENCOUNTER — Ambulatory Visit: Payer: Self-pay | Admitting: Internal Medicine

## 2018-12-28 ENCOUNTER — Encounter: Payer: Self-pay | Admitting: Internal Medicine

## 2018-12-28 ENCOUNTER — Other Ambulatory Visit: Payer: Self-pay

## 2018-12-28 VITALS — BP 128/67 | HR 70 | Temp 98.2°F | Ht 63.0 in | Wt 206.6 lb

## 2018-12-28 DIAGNOSIS — Z79899 Other long term (current) drug therapy: Secondary | ICD-10-CM

## 2018-12-28 DIAGNOSIS — I110 Hypertensive heart disease with heart failure: Secondary | ICD-10-CM

## 2018-12-28 DIAGNOSIS — I10 Essential (primary) hypertension: Secondary | ICD-10-CM

## 2018-12-28 DIAGNOSIS — I5030 Unspecified diastolic (congestive) heart failure: Secondary | ICD-10-CM

## 2018-12-28 NOTE — Progress Notes (Deleted)
   [facility] Telephone Encounter  Reason for call:   This telephone encounter was created for [name] on 12/28/2018 for the following purpose/cc HTN, HFpEF.   Pertinent Data:   ***  ROS ***   Assessment / Plan / Recommendations:   ***  As always, pt is advised that if symptoms worsen or new symptoms arise, they should go to an urgent care facility or to to ER for further evaluation.   Consent and Medical Decision Making:   Patient {GC/GE:3044014::"discussed with","seen with"} Dr. {NAMES:3044014::"Butcher","Granfortuna","E. Hoffman","Klima","Mullen","Narendra","Raines","Vincent"}  This is a telephone encounter between [name] and Thornell Mule on 12/28/2018 for ***. The visit was conducted with the patient located at {NAMES:3044014::"home"} and Thornell Mule at {NAMES:3044014::"[facility]","Home"}. The patient's identity was confirmed using their DOB and current address. The {WHO:3044014::"patient","his/her legal guardian","***"} has consented to being evaluated through a telephone encounter and understands the associated risks (an examination cannot be done and the patient may need to come in for an appointment) / benefits (allows the patient to remain at home, decreasing exposure to coronavirus). I personally spent {Numbers; 0-31:32273} minutes on medical discussion.

## 2018-12-28 NOTE — Patient Instructions (Addendum)
[name] you are doing very well.  We will continue your current medications.  You do not need lab work today.  I will have you come in around September for your flu shot and for a follow up visit.

## 2018-12-28 NOTE — Progress Notes (Signed)
   CC: HFpEF, HTN  HPI:  [name] is a 63 y.o. female with PMH below.  Today we will address HFpEF, HTN  Please see A&P for status of the patient's chronic medical conditions  Past Medical History:  Diagnosis Date  . Acute heart failure (HCC) 05/21/2018  . DVT (deep venous thrombosis) (HCC)   . Hemorrhage of rectum and anus 05/06/2017  . Hyperlipidemia   . Hypertension   . Leg pain    right  . PE (pulmonary embolism)   . Sleep apnea    Review of Systems:  ROS: Pulmonary: pt denies increased work of breathing, shortness of breath,  Cardiac: pt denies palpitations, chest pain,  Abdominal: pt denies abdominal pain, nausea, vomiting, or diarrhea   Physical Exam:  Vitals:   12/28/18 1427  BP: 128/67  Pulse: 70  Temp: 98.2 F (36.8 C)  TempSrc: Oral  SpO2: 97%  Weight: 206 lb 9.6 oz (93.7 kg)  Height: 5\' 3"  (1.6 m)   Cardiac: normal rate and rhythm, clear s1 and s2, no murmurs, rubs or gallops Pulmonary: CTAB, not in distress Abdominal: non distended abdomen, soft and nontender Extremities: no LE edema Psych: Alert, conversant, in good spirits   Social History   Socioeconomic History  . Marital status: Legally Separated    Spouse name: Not on file  . Number of children: Not on file  . Years of education: Not on file  . Highest education level: Not on file  Occupational History  . Not on file  Social Needs  . Financial resource strain: Not on file  . Food insecurity:    Worry: Not on file    Inability: Not on file  . Transportation needs:    Medical: Not on file    Non-medical: Not on file  Tobacco Use  . Smoking status: Never Smoker  . Smokeless tobacco: Never Used  Substance and Sexual Activity  . Alcohol use: No  . Drug use: Not on file  . Sexual activity: Not Currently  Lifestyle  . Physical activity:    Days per week: Not on file    Minutes per session: Not on file  . Stress: Not on file  Relationships  . Social connections:   Talks on phone: Not on file    Gets together: Not on file    Attends religious service: Not on file    Active member of club or organization: Not on file    Attends meetings of clubs or organizations: Not on file    Relationship status: Not on file  . Intimate partner violence:    Fear of current or ex partner: Not on file    Emotionally abused: Not on file    Physically abused: Not on file    Forced sexual activity: Not on file  Other Topics Concern  . Not on file  Social History Narrative  . Not on file    Family History  Problem Relation Age of Onset  . Hypertension Other   . Heart disease Other   . Diabetes Other   . Coronary artery disease Other     Assessment & Plan:   See Encounters Tab for problem based charting.  Patient discussed with Dr. Heide Spark

## 2018-12-29 ENCOUNTER — Encounter: Payer: Self-pay | Admitting: Internal Medicine

## 2018-12-29 NOTE — Assessment & Plan Note (Signed)
HFpEF: She keeps close track of her weight varies minimally from 199-201 has been steady.  She denies dyspnea, orthopnea or PND.  She continues on her daily 20mg  of lasix.  She is HFpEF on good bp control.  Looks great on exam today completely euvolemic.    -continue current regimen

## 2018-12-29 NOTE — Assessment & Plan Note (Signed)
HTN: she feels the addition of amlodipine 5mg  has been really helping her bp.  She has brought a log with her today.  Highest it has been has been 130-140 systolic but mostly around 120.  Last two office visits including today bp excellent.  Current med list is up to date.    -continue current regimen

## 2018-12-30 NOTE — Progress Notes (Signed)
Internal Medicine Clinic Attending  Case discussed with Dr. Winfrey  at the time of the visit.  We reviewed the resident's history and exam and pertinent patient test results.  I agree with the assessment, diagnosis, and plan of care documented in the resident's note.  

## 2019-01-19 MED FILL — AMLODIPINE BESYLATE 5 MG TA: 5 | 30 days supply | Qty: 30 | Fill #2

## 2019-02-15 MED FILL — AMLODIPINE BESYLATE 5 MG TA: 5 | 30 days supply | Qty: 30 | Fill #0

## 2019-02-26 ENCOUNTER — Other Ambulatory Visit: Payer: Self-pay | Admitting: Family Medicine

## 2019-02-26 ENCOUNTER — Ambulatory Visit
Admission: RE | Admit: 2019-02-26 | Discharge: 2019-02-26 | Disposition: A | Discharge status: Home / Self Care | Payer: Disability Insurance | Source: Ambulatory Visit | Attending: Family Medicine | Admitting: Family Medicine

## 2019-02-26 ENCOUNTER — Ambulatory Visit
Admission: RE | Admit: 2019-02-26 | Discharge: 2019-02-26 | Disposition: A | Discharge status: Home / Self Care | Payer: Disability Insurance | Attending: Family Medicine | Admitting: Family Medicine

## 2019-02-26 DIAGNOSIS — M545 Low back pain, unspecified: Secondary | ICD-10-CM

## 2019-03-21 NOTE — Progress Notes (Signed)
   [facility] Telephone Encounter  Reason for call:   This telephone encounter was created for [name] on 03/21/2019 for the following purpose/cc HFU covid 19.   Pertinent Data:   Hx HFpEF recent covid pna vs bronchitis admission ROS ROS: Pulmonary: pt denies increased work of breathing, shortness of breath,  Cardiac: pt denies palpitations, chest pain,   Abdominal: pt denies abdominal pain, nausea, vomiting, or diarrhea   Assessment / Plan / Recommendations:   Covid 19 HFU: Mom and sister have passed of MI and Covid 19 respectively.  Hospitilazed in [place] for Covid-19 bronchitis, had a syncopal episode likely 2/2 dehydration.  Still staying in hotel quarantining.  Brother tested positive as well and was also admitted for dehydration and covid-19.  Still occasionally gets dizzy but overall feels much better.  Can't check her weight because she's in hotel.  Breathing is good right now, she is on oxygen to use only at night and PRN during the day. Her blood pressure has been doing well 121/74 yesterday 127/76 today, this represents how  It has been doing lately.  No changes were made to medications on discharge.  She has had some trouble with cough, her improvement seems to lag behind   Pt will repeat covid 19 testing she has finished quarantine period.    Breathing stable no changes to medications    Will start pt on albuterol inhaler PRN for cough, she will also use coricidin cough syrup    As always, pt is advised that if symptoms worsen or new symptoms arise, they should go to an urgent care facility or to to ER for further evaluation.   Consent and Medical Decision Making:   Patient discussed with Dr. Rebeca Alert  This is a telephone encounter between [name] and Vickki Muff on 03/21/2019 for HFU covid pna. The visit was conducted with the patient located at home and Vickki Muff at [facility]. The patient's identity was confirmed  using their DOB and current address. The patient has consented to being evaluated through a telephone encounter and understands the associated risks (an examination cannot be done and the patient may need to come in for an appointment) / benefits (allows the patient to remain at home, decreasing exposure to coronavirus). I personally spent 15 minutes on medical discussion.

## 2019-03-22 ENCOUNTER — Ambulatory Visit (INDEPENDENT_AMBULATORY_CARE_PROVIDER_SITE_OTHER): Payer: Disability Insurance | Admitting: Internal Medicine

## 2019-03-22 ENCOUNTER — Other Ambulatory Visit: Payer: Self-pay

## 2019-03-22 DIAGNOSIS — U071 COVID-19: Secondary | ICD-10-CM

## 2019-03-22 DIAGNOSIS — I5030 Unspecified diastolic (congestive) heart failure: Secondary | ICD-10-CM

## 2019-03-22 DIAGNOSIS — J40 Bronchitis, not specified as acute or chronic: Secondary | ICD-10-CM

## 2019-03-23 ENCOUNTER — Telehealth: Payer: Self-pay | Admitting: *Deleted

## 2019-03-23 ENCOUNTER — Encounter: Payer: Self-pay | Admitting: Internal Medicine

## 2019-03-23 DIAGNOSIS — J40 Bronchitis, not specified as acute or chronic: Secondary | ICD-10-CM | POA: Insufficient documentation

## 2019-03-23 HISTORY — DX: COVID-19: J40

## 2019-03-23 MED ORDER — ALBUTEROL SULFATE HFA 108 (90 BASE) MCG/ACT IN AERS: 2.0000 | INHALATION_SPRAY | Freq: Four times a day (QID) | RESPIRATORY_TRACT | Status: DC | PRN

## 2019-03-23 NOTE — Assessment & Plan Note (Signed)
   Covid 19 HFU: Mom and sister have passed of MI and Covid 19 respectively.  [place]ospit[facility]azed in [place] for Covid-19 bronchitis, had a syncopal episode likely 2/2 dehydration.  Still staying in hotel quarantining.  Brother tested positive as well and was also admitted for dehydration and covid-19.  Still occasionally gets dizzy but overall feels much better.  Can't check her weight because she's in hotel.  Breathing is good right now, she is on oxygen to use only at night and PRN during the day. Her blood pressure has been doing well 121/74 yesterday 127/76 today, this represents how  It has been doing lately.  No changes were made to medications on discharge.  She has had some trouble with cough, her improvement seems to lag behind   Pt will repeat covid 19 testing she has finished quarantine period.    Breathing stable no changes to medications    Will start pt on albuterol inhaler PRN for cough, she will also use coricidin cough syrup

## 2019-03-23 NOTE — Telephone Encounter (Signed)
Call from patient stating she was given phone number [phone]) to schedule appt for covid testing, but she cant  get through.  Pt states she tested positive 3wks ago in [place] and has been self quarantining in a hotel in [place]. A couple of her family members also tested positive.  States she is no longer symptomatic, but did have an episode of diarrhea this morning.  Said she was also contacted by the Health Dept who informed her that she no longer needs to self quarantine.  Pt was given information to the [employer] test site (formerly [facility]) .  [facility] testing is avail Mon-Fri 8am-4pm (weather permitting).  Will have pcp place order for testing which is not required, but highly recommended.Regenia Skeeter, Channell Quattrone Cassady8/4/202011:09 AM

## 2019-03-25 MED FILL — AMLODIPINE BESYLATE 5 MG TA: 5 | 30 days supply | Qty: 30 | Fill #1

## 2019-03-25 NOTE — Progress Notes (Signed)
Internal Medicine Clinic Attending  Case discussed with Dr. Winfrey at the time of the visit.  We reviewed the resident's history and exam and pertinent patient test results.  I agree with the assessment, diagnosis, and plan of care documented in the resident's note.  Alexander Raines, M.D., Ph.D.  

## 2019-03-29 ENCOUNTER — Other Ambulatory Visit: Payer: Self-pay

## 2019-03-29 DIAGNOSIS — Z20822 Contact with and (suspected) exposure to covid-19: Secondary | ICD-10-CM

## 2019-03-30 LAB — NOVEL CORONAVIRUS, NAA: SARS-CoV-2, NAA: NOT DETECTED

## 2019-04-19 ENCOUNTER — Telehealth: Payer: Self-pay | Admitting: Internal Medicine

## 2019-04-19 NOTE — Telephone Encounter (Signed)
Return pt's call - stated she continues to have a nagging, non-productive cough. She took Delsym and Tussi Dm; using Albuterol inh; and now taking Coricidin and Tussi DM also but not helping. Tested 2 weeks ago for Covid 19 -  came back negative. Wants her doctor to prescribe something else. Thanks

## 2019-04-19 NOTE — Telephone Encounter (Signed)
Pt reporting coughing is getting no better with OTC medications and would like a call back.

## 2019-04-20 ENCOUNTER — Encounter: Payer: Self-pay | Admitting: Internal Medicine

## 2019-04-20 ENCOUNTER — Other Ambulatory Visit: Payer: Self-pay

## 2019-04-20 ENCOUNTER — Ambulatory Visit (INDEPENDENT_AMBULATORY_CARE_PROVIDER_SITE_OTHER): Payer: Disability Insurance | Admitting: Internal Medicine

## 2019-04-20 DIAGNOSIS — R058 Other specified cough: Secondary | ICD-10-CM

## 2019-04-20 DIAGNOSIS — R053 Chronic cough: Secondary | ICD-10-CM | POA: Insufficient documentation

## 2019-04-20 DIAGNOSIS — R059 Cough, unspecified: Secondary | ICD-10-CM

## 2019-04-20 DIAGNOSIS — R05 Cough: Secondary | ICD-10-CM

## 2019-04-20 DIAGNOSIS — Z8619 Personal history of other infectious and parasitic diseases: Secondary | ICD-10-CM

## 2019-04-20 HISTORY — DX: Other specified cough: R05.8

## 2019-04-20 MED ORDER — GUAIFENESIN-CODEINE 100-10 MG/5ML PO SYRP: 5.0000 mL | ORAL_SOLUTION | Freq: Every evening | ORAL | 0 refills | Status: DC | PRN

## 2019-04-20 MED ORDER — OMEPRAZOLE 20 MG PO CPDR: 20.0000 mg | DELAYED_RELEASE_CAPSULE | Freq: Every day | ORAL | 0 refills | Status: DC

## 2019-04-20 MED ORDER — BENZONATATE 100 MG PO CAPS: 100.0000 mg | ORAL_CAPSULE | Freq: Three times a day (TID) | ORAL | 1 refills | Status: AC | PRN

## 2019-04-20 MED FILL — OMEPRAZOLE 20 MG CAPSULE DR: 20 | 30 days supply | Qty: 30 | Fill #0

## 2019-04-20 MED FILL — BENZONATATE 100 MG CAPS: 100 | 20 days supply | Qty: 60 | Fill #0

## 2019-04-20 MED FILL — GUAIATUSSIN AC LIQUID: 100-10 | 24 days supply | Qty: 120 | Fill #0

## 2019-04-20 NOTE — Telephone Encounter (Signed)
Pt is agreeable for telehealth appt - scheduled for this morning @ 0945 AM.

## 2019-04-20 NOTE — Telephone Encounter (Signed)
Please schedule pt for telehealth visit

## 2019-04-20 NOTE — Progress Notes (Signed)
Internal Medicine Clinic Attending  Case discussed with Dr. Shan Levans soon after the resident spoke with the patient.  We reviewed the resident's history and exam and pertinent patient test results.  I agree with the assessment, diagnosis, and plan of care documented in the resident's note.  Could consider obtaining PFTs if persistent symptoms despite current plan and supportive care.

## 2019-04-20 NOTE — Progress Notes (Signed)
   [facility] Telephone Encounter  Reason for call:   This telephone encounter was created for [name] on 04/20/2019 for the following purpose/cc Cough.   Pertinent Data:   Recent covid 19 pna diagnosis ROS: Pulmonary: pt denies increased work of breathing, shortness of breath,  Cardiac: pt denies palpitations, chest pain,   Abdominal: pt denies abdominal pain, nausea, vomiting, or diarrhea   Assessment / Plan / Recommendations:   Cough: still persistent using albuterol inhaler first thing in the morning and at night.  No nasal congestion or runny nose. Covid symptoms otherwise completely resolved and repeat testing is negative. Does have occasional heartburn,   History of allergic rhinitis but taking coricidin already.  No fevers or chills, no sneezing. Other than the persistent cough is feeling great no difficulty breathing no muscle aches.  -tessalon perles, small amount of codeine cough syrup for unbearable night time symptoms, more frequent albuterol throughout the day and some short course omeprazole  As always, pt is advised that if symptoms worsen or new symptoms arise, they should go to an urgent care facility or to to ER for further evaluation.   Consent and Medical Decision Making:   Patient discussed with Dr. Philipp Ovens  This is a telephone encounter between [name] and Vickki Muff on 04/20/2019 for Cough. The visit was conducted with the patient located at home and Vickki Muff at [facility]. The patient's identity was confirmed using their DOB and current address. The patient has consented to being evaluated through a telephone encounter and understands the associated risks (an examination cannot be done and the patient may need to come in for an appointment) / benefits (allows the patient to remain at home, decreasing exposure to coronavirus). I personally spent 15 minutes on medical discussion.

## 2019-04-23 MED FILL — AMLODIPINE BESYLATE 5 MG TA: 5 | 30 days supply | Qty: 30 | Fill #2

## 2019-05-10 ENCOUNTER — Other Ambulatory Visit: Payer: Self-pay

## 2019-05-10 MED ORDER — ALBUTEROL SULFATE HFA 108 (90 BASE) MCG/ACT IN AERS: 2.0000 | INHALATION_SPRAY | Freq: Four times a day (QID) | RESPIRATORY_TRACT | 2 refills | Status: DC | PRN

## 2019-05-10 MED FILL — ALBUTEROL SULFATE HFA 108 (: 108 (90 BAS | 25 days supply | Qty: 18 | Fill #0

## 2019-05-10 NOTE — Telephone Encounter (Signed)
albuterol (VENTOLIN HFA) 108 (90 Base) MCG/ACT inhaler   , REFILL REQUEST @  [place], [place] - [address] [phone] (Phone) [phone] (Fax)

## 2019-05-14 ENCOUNTER — Other Ambulatory Visit: Payer: Self-pay | Admitting: Internal Medicine

## 2019-05-14 DIAGNOSIS — R059 Cough, unspecified: Secondary | ICD-10-CM

## 2019-05-14 DIAGNOSIS — R05 Cough: Secondary | ICD-10-CM

## 2019-05-14 MED FILL — GUAIATUSSIN AC LIQUID: 100-10 | 24 days supply | Qty: 120 | Fill #0

## 2019-05-26 MED FILL — AMLODIPINE BESYLATE 5 MG TA: 5 | 30 days supply | Qty: 30 | Fill #4

## 2019-06-10 ENCOUNTER — Other Ambulatory Visit: Payer: Self-pay | Admitting: Internal Medicine

## 2019-06-10 DIAGNOSIS — R05 Cough: Secondary | ICD-10-CM

## 2019-06-10 DIAGNOSIS — R059 Cough, unspecified: Secondary | ICD-10-CM

## 2019-06-28 MED FILL — AMLODIPINE BESYLATE 5 MG TA: 5 | 30 days supply | Qty: 30 | Fill #5

## 2019-07-28 ENCOUNTER — Other Ambulatory Visit: Payer: Self-pay | Admitting: Internal Medicine

## 2019-07-28 DIAGNOSIS — Z86711 Personal history of pulmonary embolism: Secondary | ICD-10-CM

## 2019-07-28 DIAGNOSIS — I10 Essential (primary) hypertension: Secondary | ICD-10-CM

## 2019-07-28 DIAGNOSIS — I5030 Unspecified diastolic (congestive) heart failure: Secondary | ICD-10-CM

## 2019-07-29 ENCOUNTER — Other Ambulatory Visit: Payer: Self-pay | Admitting: Internal Medicine

## 2019-07-29 DIAGNOSIS — I10 Essential (primary) hypertension: Secondary | ICD-10-CM

## 2019-07-29 DIAGNOSIS — I5030 Unspecified diastolic (congestive) heart failure: Secondary | ICD-10-CM

## 2019-08-02 ENCOUNTER — Other Ambulatory Visit: Payer: Self-pay | Admitting: Internal Medicine

## 2019-08-02 DIAGNOSIS — I10 Essential (primary) hypertension: Secondary | ICD-10-CM

## 2019-08-02 DIAGNOSIS — I5030 Unspecified diastolic (congestive) heart failure: Secondary | ICD-10-CM

## 2019-08-02 MED FILL — AMLODIPINE BESYLATE 5 MG TA: 5 | 30 days supply | Qty: 30 | Fill #0

## 2019-09-01 MED FILL — AMLODIPINE BESYLATE 5 MG TA: 5 | 30 days supply | Qty: 30 | Fill #1

## 2019-09-28 MED FILL — AMLODIPINE BESYLATE 5 MG TA: 5 | 30 days supply | Qty: 30 | Fill #2

## 2019-10-28 MED FILL — AMLODIPINE BESYLATE 5 MG TA: 5 | 30 days supply | Qty: 30 | Fill #3

## 2019-11-22 ENCOUNTER — Encounter: Payer: Disability Insurance | Admitting: Internal Medicine

## 2019-11-22 ENCOUNTER — Encounter: Payer: Self-pay | Admitting: Internal Medicine

## 2019-11-22 ENCOUNTER — Ambulatory Visit: Payer: Self-pay | Admitting: Internal Medicine

## 2019-11-22 VITALS — BP 115/76 | HR 62 | Temp 98.2°F | Ht 63.0 in | Wt 218.4 lb

## 2019-11-22 DIAGNOSIS — Z79899 Other long term (current) drug therapy: Secondary | ICD-10-CM

## 2019-11-22 DIAGNOSIS — I5030 Unspecified diastolic (congestive) heart failure: Secondary | ICD-10-CM

## 2019-11-22 DIAGNOSIS — Z1239 Encounter for other screening for malignant neoplasm of breast: Secondary | ICD-10-CM

## 2019-11-22 DIAGNOSIS — E785 Hyperlipidemia, unspecified: Secondary | ICD-10-CM

## 2019-11-22 DIAGNOSIS — I110 Hypertensive heart disease with heart failure: Secondary | ICD-10-CM

## 2019-11-22 DIAGNOSIS — Z1231 Encounter for screening mammogram for malignant neoplasm of breast: Secondary | ICD-10-CM

## 2019-11-22 DIAGNOSIS — I10 Essential (primary) hypertension: Secondary | ICD-10-CM

## 2019-11-22 MED ORDER — AMLODIPINE BESYLATE 5 MG PO TABS: 5.0000 mg | ORAL_TABLET | Freq: Every day | ORAL | 5 refills | Status: DC

## 2019-11-22 MED FILL — AMLODIPINE BESYLATE 5 MG TA: 5 | 30 days supply | Qty: 30 | Fill #0

## 2019-11-22 NOTE — Assessment & Plan Note (Signed)
Taking atorvastatin faithfully, she has had some weight gain over the last year and has been more sedentary.    -recheck lipid panel, continue atorvastatin 40mg  for now

## 2019-11-22 NOTE — Patient Instructions (Signed)
[name], you are doing well I have refilled your amlodipine today.  I will check your labwork today lipid panel, basic metabolic panel and get back to you with the results.  Please come back for a follow up visit in 6 months or sooner if you need [place].

## 2019-11-22 NOTE — Progress Notes (Signed)
Internal Medicine Clinic Attending  Case discussed with Dr. Winfrey  at the time of the visit.  We reviewed the resident's history and exam and pertinent patient test results.  I agree with the assessment, diagnosis, and plan of care documented in the resident's note.  

## 2019-11-22 NOTE — Assessment & Plan Note (Signed)
Discussed benefits and risks of breast cancer screening today.  She would like to be screened.  -gave pt information for mammogram scholarship program

## 2019-11-22 NOTE — Assessment & Plan Note (Signed)
She is currently taking losartan 100, carvedilol 6.25mg , amlodipine 5mg .  Her bp remains well controlled no med side effects.    -refill amlodipine today -continue current regimen

## 2019-11-22 NOTE — Progress Notes (Signed)
   CC: HTN, HFpEF, HLD, breast cancer screening  HPI:  [name] is a 64 y.o. female with PMH below.  Today we will address HTN, HFpEF, HLD, breast cancer screening   Please see A&P for status of the patient's chronic medical conditions  Past Medical History:  Diagnosis Date  . Acute heart failure (HCC) 05/21/2018  . DVT (deep venous thrombosis) (HCC)   . Hemorrhage of rectum and anus 05/06/2017  . Hyperlipidemia   . Hypertension   . Leg pain    right  . PE (pulmonary embolism)   . Sleep apnea    Review of Systems:  ROS: Pulmonary: pt denies increased work of breathing, shortness of breath,  Cardiac: pt denies palpitations, chest pain,  Abdominal: pt denies abdominal pain, nausea, vomiting, or diarrhea   Physical Exam:  Vitals:   11/22/19 0858  BP: 115/76  Pulse: 62  Temp: 98.2 F (36.8 C)  TempSrc: Oral  SpO2: 98%  Weight: 218 lb 6.4 oz (99.1 kg)  Height: 5\' 3"  (1.6 m)   Cardiac: JVD flat, normal rate and rhythm, clear s1 and s2, no murmurs, rubs or gallops, no LE edema Pulmonary: CTAB, not in distress Abdominal: non distended abdomen, soft and nontender Psych: Alert, conversant, in good spirits   Social History   Socioeconomic History  . Marital status: Legally Separated    Spouse name: Not on file  . Number of children: Not on file  . Years of education: Not on file  . Highest education level: Not on file  Occupational History  . Not on file  Tobacco Use  . Smoking status: Never Smoker  . Smokeless tobacco: Never Used  Substance and Sexual Activity  . Alcohol use: No  . Drug use: Not on file  . Sexual activity: Not Currently  Other Topics Concern  . Not on file  Social History Narrative  . Not on file   Social Determinants of Health   Financial Resource Strain:   . Difficulty of Paying Living Expenses:   Food Insecurity:   . Worried About in the Last Year:   . Programme researcher, broadcasting/film/video in the Last Year:     Transportation Needs:   . Barista (Medical):   Freight forwarder Lack of Transportation (Non-Medical):   Physical Activity:   . Days of Exercise per Week:   . Minutes of Exercise per Session:   Stress:   . Feeling of Stress :   Social Connections:   . Frequency of Communication with Friends and Family:   . Frequency of Social Gatherings with Friends and Family:   . Attends Religious Services:   . Active Member of Clubs or Organizations:   . Attends Marland Kitchen Meetings:   Banker Marital Status:   Intimate Partner Violence:   . Fear of Current or Ex-Partner:   . Emotionally Abused:   Marland Kitchen Physically Abused:   . Sexually Abused:     Family History  Problem Relation Age of Onset  . Hypertension Other   . Heart disease Other   . Diabetes Other   . Coronary artery disease Other     Assessment & Plan:   See Encounters Tab for problem based charting.  Patient discussed with Dr. Marland Kitchen

## 2019-11-22 NOTE — Assessment & Plan Note (Signed)
>>  ASSESSMENT AND PLAN FOR BREAST CANCER SCREENING BY MAMMOGRAM WRITTEN ON 11/22/2019  9:47 AM BY Cherylene Corrente, MD  Discussed benefits and risks of breast cancer screening today.  She would like to be screened.  -gave pt information for mammogram scholarship program

## 2019-11-22 NOTE — Assessment & Plan Note (Addendum)
Avoiding salt, no longer weighing herself daily.  Still taking lasix daily, good bp control.  She does get short of breath with long walks like the one to the clinic today but she feels she is at her baseline.  She has been very inactive as of late.  She has gained about 8lbs since last year.  She is euvolemic on exam I do not feel this is water weight.    -we will continue tight bp control and lasix 20mg  -she will increase her activity levels at home with a daily walk program and work on her diet

## 2019-11-23 ENCOUNTER — Telehealth: Payer: Self-pay | Admitting: Internal Medicine

## 2019-11-23 LAB — BMP8+ANION GAP
Anion Gap: 14 mmol/L (ref 10.0–18.0)
BUN/Creatinine Ratio: 13 (ref 12–28)
BUN: 11 mg/dL (ref 8–27)
CO2: 22 mmol/L (ref 20–29)
Calcium: 9.1 mg/dL (ref 8.7–10.3)
Chloride: 106 mmol/L (ref 96–106)
Creatinine, Ser: 0.83 mg/dL (ref 0.57–1.00)
GFR calc Af Amer: 87 mL/min/{1.73_m2} (ref >59)
GFR calc non Af Amer: 76 mL/min/{1.73_m2} (ref >59)
Glucose: 108 mg/dL — ABNORMAL HIGH (ref 65–99)
Potassium: 3.8 mmol/L (ref 3.5–5.2)
Sodium: 142 mmol/L (ref 134–144)

## 2019-11-23 LAB — LIPID PANEL
Chol/HDL Ratio: 2.8 ratio (ref 0.0–4.4)
Cholesterol, Total: 123 mg/dL (ref 100–199)
HDL: 44 mg/dL (ref >39)
LDL Chol Calc (NIH): 64 mg/dL (ref 0–99)
Triglycerides: 73 mg/dL (ref 0–149)
VLDL Cholesterol Cal: 15 mg/dL (ref 5–40)

## 2019-11-23 NOTE — Telephone Encounter (Signed)
Spoke with pt about lipid panel and bmp results, no changes in medications necessary

## 2019-12-06 ENCOUNTER — Encounter: Payer: Disability Insurance | Admitting: Internal Medicine

## 2019-12-09 ENCOUNTER — Encounter: Payer: Self-pay | Admitting: *Deleted

## 2019-12-16 ENCOUNTER — Ambulatory Visit: Payer: Self-pay | Admitting: Internal Medicine

## 2019-12-16 ENCOUNTER — Encounter: Payer: Self-pay | Admitting: Internal Medicine

## 2019-12-16 ENCOUNTER — Other Ambulatory Visit: Payer: Self-pay

## 2019-12-16 VITALS — BP 116/59 | HR 64 | Temp 98.2°F | Ht 63.0 in | Wt 221.3 lb

## 2019-12-16 DIAGNOSIS — J301 Allergic rhinitis due to pollen: Secondary | ICD-10-CM

## 2019-12-16 DIAGNOSIS — R059 Cough, unspecified: Secondary | ICD-10-CM

## 2019-12-16 DIAGNOSIS — R05 Cough: Secondary | ICD-10-CM

## 2019-12-16 MED ORDER — GUAIFENESIN-CODEINE 100-10 MG/5ML PO SYRP: 5.0000 mL | ORAL_SOLUTION | Freq: Three times a day (TID) | ORAL | 0 refills | Status: DC | PRN

## 2019-12-16 MED ORDER — LORATADINE 10 MG PO TABS: 10.0000 mg | ORAL_TABLET | Freq: Every day | ORAL | 0 refills | Status: DC

## 2019-12-16 MED FILL — GUAIATUSSIN AC LIQUID: 100-10 | 8 days supply | Qty: 236 | Fill #0

## 2019-12-16 NOTE — Patient Instructions (Signed)
[name], I have written for an antihistamine, and stronger cough syrup.  If you still have symptoms after 10 days please call in and I will write you a prescription for antibiotics.

## 2019-12-16 NOTE — Assessment & Plan Note (Signed)
>>  ASSESSMENT AND PLAN FOR CHRONIC COUGH WRITTEN ON 12/16/2019 12:32 PM BY Katherine Roan, MD  Persistent after covid 19, now worsened by seasonal allergies.  No fevers or chills.    -represcribe stronger cough syrup guinafesin with codeine

## 2019-12-16 NOTE — Assessment & Plan Note (Addendum)
Increased runny nose, watery eyes and cough recently.  Started back taking flonase that has helped.  She is not taking any antihistamines.  3 days ago her symptoms flared up. Increased sinus drainage, bilateral ear fullness/pressure, worsening cough. Now she has hoarseness.    She wonders if being around her granddaughter three weeks ago who is covid positive.  She herself had the virus last year but never followed up with a vaccine.  She has no dyspnea whatsoever. No fevers, chills or malaise.  No changes in her routine over the last week she has been staying at home.  She has been affected severely by the pollen around her house.  She does spend time outside walking for exercise.   -add claritin, nasal irrigation device and cough syrup with codeine -she will call back if no improvement in 10 days

## 2019-12-16 NOTE — Assessment & Plan Note (Signed)
Persistent after covid 19, now worsened by seasonal allergies.  No fevers or chills.    -represcribe stronger cough syrup guinafesin with codeine

## 2019-12-16 NOTE — Progress Notes (Signed)
CC: allergic rhinitis, cough  HPI:  [name] is a 64 y.o. female with PMH below.  Today we will address allergic rhinitis, cough     Please see A&P for status of the patient's chronic medical conditions  Past Medical History:  Diagnosis Date  . Acute heart failure ([facility]) 05/21/2018  . DVT (deep venous thrombosis) ([facility])   . Hemorrhage of rectum and anus 05/06/2017  . Hyperlipidemia   . Hypertension   . Leg pain    right  . PE (pulmonary embolism)   . Sleep apnea    Review of Systems:  ROS: Pulmonary: pt denies increased work of breathing, shortness of breath,  Cardiac: pt denies palpitations, chest pain,  Abdominal: pt denies abdominal pain, nausea, vomiting, or diarrhea   Physical Exam:  Vitals:   12/16/19 0947  BP: (!) 116/59  Pulse: 64  Temp: 98.2 F (36.8 C)  TempSrc: Oral  SpO2: 98%  Weight: 221 lb 4.8 oz (100.4 kg)  Height: 5\' 3"  (1.6 m)   Physical Exam Constitutional:      General: She is not in acute distress.    Appearance: She is not diaphoretic.  HENT:     Right Ear: Tympanic membrane normal.     Left Ear: Tympanic membrane normal.  Cardiovascular:     Rate and Rhythm: Normal rate and regular rhythm.     Heart sounds: Normal heart sounds. No murmur. No friction rub. No gallop.   Pulmonary:     Effort: Pulmonary effort is normal. No respiratory distress.     Breath sounds: Normal breath sounds. No wheezing or rales.  Chest:     Chest wall: No tenderness.  Neurological:     Mental Status: She is alert.      Social History   Socioeconomic History  . Marital status: Legally Separated    Spouse name: Not on file  . Number of children: Not on file  . Years of education: Not on file  . Highest education level: Not on file  Occupational History  . Not on file  Tobacco Use  . Smoking status: Never Smoker  . Smokeless tobacco: Never Used  Substance and Sexual Activity  . Alcohol use: No  . Drug use: Not on file  . Sexual  activity: Not Currently  Other Topics Concern  . Not on file  Social History Narrative  . Not on file   Social Determinants of Health   Financial Resource Strain:   . Difficulty of Paying Living Expenses:   Food Insecurity:   . Worried About Charity fundraiser in the Last Year:   . Arboriculturist in the Last Year:   Transportation Needs:   . Film/video editor (Medical):   Marland Kitchen Lack of Transportation (Non-Medical):   Physical Activity:   . Days of Exercise per Week:   . Minutes of Exercise per Session:   Stress:   . Feeling of Stress :   Social Connections:   . Frequency of Communication with Friends and Family:   . Frequency of Social Gatherings with Friends and Family:   . Attends Religious Services:   . Active Member of Clubs or Organizations:   . Attends Archivist Meetings:   Marland Kitchen Marital Status:   Intimate Partner Violence:   . Fear of Current or Ex-Partner:   . Emotionally Abused:   Marland Kitchen Physically Abused:   . Sexually Abused:     Family History  Problem Relation Age  of Onset  . Hypertension Other   . Heart disease Other   . Diabetes Other   . Coronary artery disease Other     Assessment & Plan:   See Encounters Tab for problem based charting.  Patient discussed with Dr. Antony Contras

## 2019-12-17 NOTE — Progress Notes (Signed)
Internal Medicine Clinic Attending  Case discussed with Dr. Winfrey  at the time of the visit.  We reviewed the resident's history and exam and pertinent patient test results.  I agree with the assessment, diagnosis, and plan of care documented in the resident's note.  

## 2020-02-01 ENCOUNTER — Other Ambulatory Visit: Payer: Self-pay | Admitting: Internal Medicine

## 2020-02-01 DIAGNOSIS — I5030 Unspecified diastolic (congestive) heart failure: Secondary | ICD-10-CM

## 2020-02-01 DIAGNOSIS — I10 Essential (primary) hypertension: Secondary | ICD-10-CM

## 2020-02-01 DIAGNOSIS — Z86711 Personal history of pulmonary embolism: Secondary | ICD-10-CM

## 2020-02-29 MED FILL — AMLODIPINE BESYLATE 5 MG TA: 5 | 30 days supply | Qty: 30 | Fill #3

## 2020-03-31 MED FILL — AMLODIPINE BESYLATE 5 MG TA: 5 | 30 days supply | Qty: 30 | Fill #4

## 2020-05-01 MED FILL — AMLODIPINE BESYLATE 5 MG TA: 5 | 30 days supply | Qty: 30 | Fill #5

## 2020-05-31 MED FILL — AMLODIPINE BESYLATE 5 MG TA: 5 | 30 days supply | Qty: 30 | Fill #4

## 2020-07-03 MED FILL — AMLODIPINE BESYLATE 5 MG TA: 5 | 30 days supply | Qty: 30 | Fill #5

## 2020-08-09 ENCOUNTER — Other Ambulatory Visit: Payer: Self-pay | Admitting: *Deleted

## 2020-08-09 DIAGNOSIS — I5030 Unspecified diastolic (congestive) heart failure: Secondary | ICD-10-CM

## 2020-08-09 DIAGNOSIS — I10 Essential (primary) hypertension: Secondary | ICD-10-CM

## 2020-08-09 DIAGNOSIS — Z86711 Personal history of pulmonary embolism: Secondary | ICD-10-CM

## 2020-08-09 MED ORDER — FUROSEMIDE 20 MG PO TABS: 20.0000 mg | ORAL_TABLET | Freq: Every day | ORAL | 0 refills | Status: DC

## 2020-08-09 MED ORDER — EPLERENONE 25 MG PO TABS: 12.5000 mg | ORAL_TABLET | Freq: Every day | ORAL | 0 refills | Status: DC

## 2020-08-09 MED ORDER — CARVEDILOL 6.25 MG PO TABS
6.2500 mg | ORAL_TABLET | Freq: Two times a day (BID) | ORAL | 0 refills | Status: DC
Start: 2020-08-09 — End: 2020-09-05

## 2020-08-09 MED ORDER — ATORVASTATIN CALCIUM 40 MG PO TABS
40.0000 mg | ORAL_TABLET | Freq: Every day | ORAL | 0 refills | Status: DC
Start: 2020-08-09 — End: 2020-09-05

## 2020-08-09 MED ORDER — RIVAROXABAN 20 MG PO TABS: 20.0000 mg | ORAL_TABLET | Freq: Every day | ORAL | 0 refills | Status: DC

## 2020-08-09 MED ORDER — LOSARTAN POTASSIUM 100 MG PO TABS: 100.0000 mg | ORAL_TABLET | Freq: Every day | ORAL | 0 refills | Status: DC

## 2020-08-09 NOTE — Telephone Encounter (Signed)
Provided three month refill. She will need to follow-up in clinic for blood pressure check and labs prior to next refill.

## 2020-08-21 ENCOUNTER — Telehealth: Payer: Self-pay | Admitting: *Deleted

## 2020-08-21 ENCOUNTER — Other Ambulatory Visit: Payer: Self-pay

## 2020-08-21 ENCOUNTER — Encounter: Payer: Self-pay | Admitting: Internal Medicine

## 2020-08-21 ENCOUNTER — Other Ambulatory Visit: Payer: Self-pay | Admitting: *Deleted

## 2020-08-21 ENCOUNTER — Ambulatory Visit: Payer: Self-pay | Admitting: Internal Medicine

## 2020-08-21 ENCOUNTER — Other Ambulatory Visit: Payer: Self-pay | Admitting: Student

## 2020-08-21 VITALS — BP 141/69 | HR 77 | Temp 98.2°F | Ht 63.0 in | Wt 220.0 lb

## 2020-08-21 DIAGNOSIS — I10 Essential (primary) hypertension: Secondary | ICD-10-CM

## 2020-08-21 DIAGNOSIS — I5030 Unspecified diastolic (congestive) heart failure: Secondary | ICD-10-CM

## 2020-08-21 DIAGNOSIS — Z1231 Encounter for screening mammogram for malignant neoplasm of breast: Secondary | ICD-10-CM

## 2020-08-21 DIAGNOSIS — I110 Hypertensive heart disease with heart failure: Secondary | ICD-10-CM

## 2020-08-21 DIAGNOSIS — I82622 Acute embolism and thrombosis of deep veins of left upper extremity: Secondary | ICD-10-CM

## 2020-08-21 MED ORDER — AMLODIPINE BESYLATE 5 MG PO TABS: 5.0000 mg | ORAL_TABLET | Freq: Every day | ORAL | 5 refills | Status: DC

## 2020-08-21 MED FILL — AMLODIPINE BESYLATE 5 MG TA: 5 | 30 days supply | Qty: 30 | Fill #0

## 2020-08-21 NOTE — Patient Instructions (Signed)
Thank you, Ms.[name] for allowing [place] to provide your care today. Today we discussed blood clot and medication management.    I have ordered the following labs for you:   Lab Orders     BMP8+Anion Gap   Tests ordered today:  Mammogram  Referrals ordered today:   Referral Orders  No referral(s) requested today     I have ordered the following medication/changed the following medications:   Stop the following medications: There are no discontinued medications.   Start the following medications: No orders of the defined types were placed in this encounter.    Follow up: 2 weeks to reach your arm and blood pressure   Remember: to take you medications.   Should you have any questions or concerns please call the internal medicine clinic at [phone].     Dellia Cloud, D.O. [facility]

## 2020-08-21 NOTE — Progress Notes (Signed)
CC: Arm pain   HPI:  [name] is a 65 y.o. female with a past medical history stated below and presents today for Arm pain . Please see problem based assessment and plan for additional details.  Past Medical History:  Diagnosis Date  . Acute heart failure (HCC) 05/21/2018  . DVT (deep venous thrombosis) (HCC)   . Hemorrhage of rectum and anus 05/06/2017  . Hyperlipidemia   . Hypertension   . Leg pain    right  . PE (pulmonary embolism)   . Sleep apnea     Current Outpatient Medications on File Prior to Visit  Medication Sig Dispense Refill  . albuterol (VENTOLIN HFA) 108 (90 Base) MCG/ACT inhaler Inhale 2 puffs into the lungs every 6 (six) hours as needed for wheezing or shortness of breath. 18 g 2  . atorvastatin (LIPITOR) 40 MG tablet Take 1 tablet (40 mg total) by mouth daily. 90 tablet 0  . carvedilol (COREG) 6.25 MG tablet Take 1 tablet (6.25 mg total) by mouth 2 (two) times daily with a meal. 180 tablet 0  . eplerenone (INSPRA) 25 MG tablet Take 0.5 tablets (12.5 mg total) by mouth daily. 45 tablet 0  . furosemide (LASIX) 20 MG tablet Take 1 tablet (20 mg total) by mouth daily. 90 tablet 0  . guaiFENesin-codeine (ROBITUSSIN AC) 100-10 MG/5ML syrup Take 5-10 mLs by mouth 3 (three) times daily as needed for cough. 236 mL 0  . guaiFENesin-dextromethorphan (ROBITUSSIN DM) 100-10 MG/5ML syrup Take 5-10 mLs by mouth every 8 (eight) hours as needed for cough. 236 mL 1  . loratadine (CLARITIN) 10 MG tablet Take 1 tablet (10 mg total) by mouth daily. 30 tablet 0  . losartan (COZAAR) 100 MG tablet Take 1 tablet (100 mg total) by mouth daily. 90 tablet 0  . omeprazole (PRILOSEC) 20 MG capsule Take 1 capsule (20 mg total) by mouth daily. 30 capsule 0  . rivaroxaban (XARELTO) 20 MG TABS tablet Take 1 tablet (20 mg total) by mouth daily with supper. 90 tablet 0   No current facility-administered medications on file prior to visit.    Family History  Problem Relation Age of  Onset  . Hypertension Other   . Heart disease Other   . Diabetes Other   . Coronary artery disease Other     Social History   Socioeconomic History  . Marital status: Legally Separated    Spouse name: Not on file  . Number of children: Not on file  . Years of education: Not on file  . Highest education level: Not on file  Occupational History  . Not on file  Tobacco Use  . Smoking status: Never Smoker  . Smokeless tobacco: Never Used  Vaping Use  . Vaping Use: Never used  Substance and Sexual Activity  . Alcohol use: No  . Drug use: Not on file  . Sexual activity: Not Currently  Other Topics Concern  . Not on file  Social History Narrative  . Not on file   Social Determinants of Health   Financial Resource Strain: Not on file  Food Insecurity: Not on file  Transportation Needs: Not on file  Physical Activity: Not on file  Stress: Not on file  Social Connections: Not on file  Intimate Partner Violence: Not on file    Review of Systems: ROS negative except for what is noted on the assessment and plan.  Vitals:   08/21/20 1331  BP: (!) 141/69  Pulse: 77  Temp: 98.2 F (36.8 C)  TempSrc: Oral  SpO2: 97%  Weight: 220 lb (99.8 kg)  Height: 5\' 3"  (1.6 m)     Physical Exam: Physical Exam Constitutional:      Appearance: Normal appearance. She is obese.  HENT:     Head: Normocephalic and atraumatic.  Eyes:     Extraocular Movements: Extraocular movements intact.  Cardiovascular:     Rate and Rhythm: Normal rate and regular rhythm.     Pulses: Normal pulses.     Heart sounds: Normal heart sounds.  Pulmonary:     Effort: Pulmonary effort is normal.     Breath sounds: Normal breath sounds.  Abdominal:     General: There is no distension.     Tenderness: There is no abdominal tenderness.  Musculoskeletal:        General: Tenderness (left ulnar aspect of her forearm) present.     Cervical back: Normal range of motion.  Skin:    General: Skin is warm  and dry.  Neurological:     General: No focal deficit present.     Mental Status: She is alert and oriented to person, place, and time.  Psychiatric:        Mood and Affect: Mood normal.         Document Information  Photos    08/21/2020 14:26  Attached To:  Office Visit on 08/21/20 with 10/19/20, MD   Source Information  Dellia Cloud, MD  [facility]     Assessment & Plan:   See Encounters Tab for problem based charting.  Patient discussed with Dr. Dellia Cloud, D.O. [facility], PGY-2 Pager: [phone], Phone: [phone] Date 08/22/2020 Time 5:35 PM

## 2020-08-21 NOTE — Telephone Encounter (Signed)
I have approved her prescription.

## 2020-08-21 NOTE — Telephone Encounter (Signed)
rtc to pt, she is scheduled an appt for today due to problem w/ L forearm, she thinks is a blood clot She is also mad because she states she is out of medicine, she is informed that [facility], she states "oh, yeah, they told me my stuff was coming this week but [facility] said [facility] could not get hold yall and you never answer the phone, I been tryin to call too and you just ignore me.triage apologized and told pt the amlodipine would be sent to pharmacy.

## 2020-08-22 ENCOUNTER — Encounter: Payer: Self-pay | Admitting: Internal Medicine

## 2020-08-22 DIAGNOSIS — I82409 Acute embolism and thrombosis of unspecified deep veins of unspecified lower extremity: Secondary | ICD-10-CM | POA: Insufficient documentation

## 2020-08-22 LAB — BMP8+ANION GAP
Anion Gap: 12 mmol/L (ref 10.0–18.0)
BUN/Creatinine Ratio: 18 (ref 12–28)
BUN: 18 mg/dL (ref 8–27)
CO2: 25 mmol/L (ref 20–29)
Calcium: 8.6 mg/dL — ABNORMAL LOW (ref 8.7–10.3)
Chloride: 108 mmol/L — ABNORMAL HIGH (ref 96–106)
Creatinine, Ser: 0.99 mg/dL (ref 0.57–1.00)
GFR calc Af Amer: 71 mL/min/{1.73_m2} (ref >59)
GFR calc non Af Amer: 61 mL/min/{1.73_m2} (ref >59)
Glucose: 79 mg/dL (ref 65–99)
Potassium: 3.6 mmol/L (ref 3.5–5.2)
Sodium: 145 mmol/L — ABNORMAL HIGH (ref 134–144)

## 2020-08-22 NOTE — Assessment & Plan Note (Signed)
>>  ASSESSMENT AND PLAN FOR DEEP VEIN THROMBOSIS (DVT) (HCC) WRITTEN ON 08/22/2020  5:39 PM BY COE, BENJAMIN, MD  Patient presents with acute onset pain in her left forearm on the ulnar aspect.  Patient states that the pain occurred a couple days ago has progressed since then.  She states that is tender to palpation without significant warmth, edema or erythema.  Patient states that she has history of blood clots and has been off of her Xarelto  for almost 2 weeks because she ran out of her medication.  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  Back in 2012 patient was tested for factor V Leiden which was negative.  She was also tested for protein C and protein S deficiency it appears that she was on anticoagulation at that time.  Considering her most recent DVT while being off of her Xarelto  for only a few weeks, patient would likely need repeat testing for hypercoagulable state including protein C and protein S prothrombin/Antithrombin, and antiphospholipid syndrome when she is able to tolerate holding her anticoagulation for short period of time.  Bedside ultrasound with Doppler was performed today showing evidence of a DVT in the ulnar vein (see clinic note for ultrasound image)  Plan: 1. Patient was restarted on her home dose of Xarelto  20 mg daily.  She was given samples in the clinic to hold her over until she can get her refill of her medication. 2.  Asked patient to follow-up in 1 weeks to reevaluate her arm

## 2020-08-22 NOTE — Assessment & Plan Note (Addendum)
Patient presents with acute onset pain in her left forearm on the ulnar aspect.  Patient states that the pain occurred a couple days ago has progressed since then.  She states that is tender to palpation without significant warmth, edema or erythema.  Patient states that she has history of blood clots and has been off of her Xarelto for almost 2 weeks because she ran out of her medication.  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  Back in 2012 patient was tested for factor V Leiden which was negative.  She was also tested for protein C and protein S deficiency it appears that she was on anticoagulation at that time.  Considering her most recent DVT while being off of her Xarelto for only a few weeks, patient would likely need repeat testing for hypercoagulable state including protein C and protein S prothrombin/Antithrombin, and antiphospholipid syndrome when she is able to tolerate holding her anticoagulation for short period of time.  Bedside ultrasound with Doppler was performed today showing evidence of a DVT in the ulnar vein (see clinic note for ultrasound image)  Plan: 1. Patient was restarted on her home dose of Xarelto 20 mg daily.  She was given samples in the clinic to hold her over until she can get her refill of her medication. 2.  Asked patient to follow-up in 1 weeks to reevaluate her arm

## 2020-08-22 NOTE — Assessment & Plan Note (Signed)
>>  ASSESSMENT AND PLAN FOR BREAST CANCER SCREENING BY MAMMOGRAM WRITTEN ON 08/22/2020  5:35 PM BY COE, BENJAMIN, MD  Refer patient for mammography today.

## 2020-08-22 NOTE — Assessment & Plan Note (Signed)
Refer patient for mammography today.

## 2020-08-22 NOTE — Assessment & Plan Note (Addendum)
Patient has a history of heart failure with preserved ejection fraction with her most recent echo in 2019 showing mild left ventricular hypertrophy and an EF of 60 to 65%.  She has has grade 1 diastolic dysfunction with no significant valvular abnormalities.  She is to weigh herself regularly but states recently she has not been doing this.  She does not believe that she is experienced any significant weight gain over the last several weeks and endorses trying to remain on a salt restricted diet.  She admits to drinking up to 64 ounces of fluid daily but denies drinking in excess of this.  She states that she been taking her furosemide 20 mg daily with good urine output.  Patient does not appear overtly hypervolemic on exam today  Plan: -Continue furosemide 20 mg daily - Will assess kidney function and electrolytes today.

## 2020-08-22 NOTE — Assessment & Plan Note (Signed)
Patient presents today for further evaluation and management of her HTN. Her blood pressure is uncontrolled on amlodipine (Norvase), losartran (Cozaar) and Carvedilol, although she states that she has been out of her medications for 2 weeks. She admits to being adherent with her antihypertensive medications when she has them. She identifies the following barriers: medications are mailed to her and sometimes they do not arrive on time. She admits to no side effects noted.   Cardiovascular risk factors: dyslipidemia, hypertension, obesity (BMI >= 30 kg/m2) and sedentary lifestyle. History of target organ damage: heart failure and left ventricular hypertrophy. She denies moderate intensity exercise daily. She is adherent to a low-salt diet and is not checking his blood pressure at home.   Current Blood pressure for today's visit is listed below:  Blood Pressure 08/21/2020 12/16/2019 11/22/2019  BP 141/69 116/59 115/76  Some recent data might be hidden     Plan: 1. Restart taking her antihypertensive medications (losartan 100 mg daily, amlodipine 5 mg daily, carvedilol 6.25 mg daily)  2. Counsel regarding DASH diet  3. Counsel regarding exercise daily   Exercise:

## 2020-08-28 ENCOUNTER — Other Ambulatory Visit: Payer: Self-pay | Admitting: Internal Medicine

## 2020-08-28 ENCOUNTER — Telehealth: Payer: Self-pay

## 2020-08-28 DIAGNOSIS — I10 Essential (primary) hypertension: Secondary | ICD-10-CM

## 2020-08-28 DIAGNOSIS — I5030 Unspecified diastolic (congestive) heart failure: Secondary | ICD-10-CM

## 2020-08-28 MED ORDER — FUROSEMIDE 20 MG PO TABS: 20.0000 mg | ORAL_TABLET | Freq: Every day | ORAL | 0 refills | Status: DC

## 2020-08-28 MED ORDER — LOSARTAN POTASSIUM 100 MG PO TABS: 100.0000 mg | ORAL_TABLET | Freq: Every day | ORAL | 0 refills | Status: DC

## 2020-08-28 MED ORDER — RIVAROXABAN 20 MG PO TABS: 20.0000 mg | ORAL_TABLET | Freq: Every day | ORAL | 0 refills | Status: DC

## 2020-08-28 MED ORDER — EPLERENONE 25 MG PO TABS
12.5000 mg | ORAL_TABLET | Freq: Every day | ORAL | 0 refills | Status: DC
Start: 2020-08-28 — End: 2020-09-05

## 2020-08-28 MED FILL — FUROSEMIDE 20 MG TABS: 20 | 7 days supply | Qty: 7 | Fill #0

## 2020-08-28 MED FILL — EPLERENONE 25 MG TABS: 25 | 8 days supply | Qty: 4 | Fill #0

## 2020-08-28 MED FILL — LOSARTAN POTASSIUM 100 MG T: 100 | 7 days supply | Qty: 7 | Fill #0

## 2020-08-28 MED FILL — XARELTO 20 MG TABLET: 20 | 7 days supply | Qty: 7 | Fill #0

## 2020-08-28 NOTE — Telephone Encounter (Signed)
RTC (see previous note), patient tried calling [facility] and is still getting a message that the pharmacy is closed until tomorrow.  This RN also tried calling [facility] and same message obtained.   Patient unable to find out if [facility] has processed her RX refill request, cannot get status update. Patient needs short-term supply on Xarelto, lasix, inspra, and losartan ([facility]).  Will forward to PCP and PharmD to see if Boykin Reaper has any suggestions to help patient. Thank you, SChaplin, RN,BSN

## 2020-08-28 NOTE — Telephone Encounter (Signed)
Returned call to patient. States she has not received her meds that were sent to [facility] on 08/09/2020. Advised patient call [facility] for status update. She will call back requesting short supply of meds be sent to [facility] once she knows estimated date of arrival of her meds. Kinnie Feil, BSN, RN-BC

## 2020-08-28 NOTE — Telephone Encounter (Signed)
Sure thing. I will send over a short course of medications listed to [facility].

## 2020-08-28 NOTE — Telephone Encounter (Signed)
Pls contact pt [phone]

## 2020-08-28 NOTE — Telephone Encounter (Signed)
I think sending over a short supply to [facility] is the best option for time being. Out of those meds the only one we keep in sample room is Xarelto, and I am not sure of the current supply. Likely easiest to just send them all to [facility].

## 2020-08-28 NOTE — Telephone Encounter (Signed)
Pt is requesting a nurse to call back [phone]

## 2020-08-29 NOTE — Progress Notes (Addendum)
Internal Medicine Clinic Attending  I saw and evaluated the patient.  I personally confirmed the key portions of the history and exam documented by Dr. Coe and I reviewed pertinent patient test results.  The assessment, diagnosis, and plan were formulated together and I agree with the documentation in the resident's note.    

## 2020-09-05 ENCOUNTER — Encounter: Payer: Self-pay | Admitting: Internal Medicine

## 2020-09-05 ENCOUNTER — Ambulatory Visit: Payer: Self-pay | Admitting: Internal Medicine

## 2020-09-05 ENCOUNTER — Other Ambulatory Visit: Payer: Self-pay | Admitting: Internal Medicine

## 2020-09-05 VITALS — BP 128/64 | HR 83 | Temp 98.1°F | Ht 63.0 in | Wt 221.8 lb

## 2020-09-05 DIAGNOSIS — G4733 Obstructive sleep apnea (adult) (pediatric): Secondary | ICD-10-CM

## 2020-09-05 DIAGNOSIS — I82622 Acute embolism and thrombosis of deep veins of left upper extremity: Secondary | ICD-10-CM

## 2020-09-05 DIAGNOSIS — Z139 Encounter for screening, unspecified: Secondary | ICD-10-CM

## 2020-09-05 DIAGNOSIS — I10 Essential (primary) hypertension: Secondary | ICD-10-CM

## 2020-09-05 DIAGNOSIS — E785 Hyperlipidemia, unspecified: Secondary | ICD-10-CM

## 2020-09-05 DIAGNOSIS — Z86711 Personal history of pulmonary embolism: Secondary | ICD-10-CM

## 2020-09-05 DIAGNOSIS — E669 Obesity, unspecified: Secondary | ICD-10-CM

## 2020-09-05 DIAGNOSIS — I5030 Unspecified diastolic (congestive) heart failure: Secondary | ICD-10-CM

## 2020-09-05 MED ORDER — FUROSEMIDE 20 MG PO TABS: 20.0000 mg | ORAL_TABLET | Freq: Every day | ORAL | 2 refills | Status: DC

## 2020-09-05 MED ORDER — AMLODIPINE BESYLATE 5 MG PO TABS
5.0000 mg | ORAL_TABLET | Freq: Every day | ORAL | 2 refills | Status: DC
Start: 2020-09-05 — End: 2020-09-05

## 2020-09-05 MED ORDER — EPLERENONE 25 MG PO TABS: 12.5000 mg | ORAL_TABLET | Freq: Every day | ORAL | 2 refills | Status: DC

## 2020-09-05 MED ORDER — ATORVASTATIN CALCIUM 40 MG PO TABS
40.0000 mg | ORAL_TABLET | Freq: Every day | ORAL | 2 refills | Status: DC
Start: 2020-09-05 — End: 2020-09-05

## 2020-09-05 MED ORDER — CARVEDILOL 6.25 MG PO TABS: 6.2500 mg | ORAL_TABLET | Freq: Two times a day (BID) | ORAL | 2 refills | Status: DC

## 2020-09-05 MED ORDER — LOSARTAN POTASSIUM 100 MG PO TABS: 100.0000 mg | ORAL_TABLET | Freq: Every day | ORAL | 2 refills | Status: DC

## 2020-09-05 MED ORDER — RIVAROXABAN 20 MG PO TABS
20.0000 mg | ORAL_TABLET | Freq: Every day | ORAL | 2 refills | Status: DC
Start: 2020-09-05 — End: 2020-09-05

## 2020-09-05 MED FILL — EPLERENONE 25 MG TABS: 25 | 30 days supply | Qty: 15 | Fill #0

## 2020-09-05 MED FILL — ATORVASTATIN 40 MG TABLET: 40 | 30 days supply | Qty: 30 | Fill #0

## 2020-09-05 MED FILL — FUROSEMIDE 20 MG TABS: 20 | 30 days supply | Qty: 30 | Fill #0

## 2020-09-05 MED FILL — CARVEDILOL 6.25 MG TABLET: 6.25 | 30 days supply | Qty: 60 | Fill #0

## 2020-09-05 MED FILL — LOSARTAN POTASSIUM 100 MG T: 100 | 30 days supply | Qty: 30 | Fill #0

## 2020-09-05 MED FILL — XARELTO 20 MG TABLET: 20 | 30 days supply | Qty: 30 | Fill #0

## 2020-09-05 NOTE — Progress Notes (Signed)
CC: Left arm DVT  HPI:  [name] is a 65 y.o. female with a past medical history stated below and presents today for DVT . Please see problem based assessment and plan for additional details.  Past Medical History:  Diagnosis Date  . Acute heart failure (HCC) 05/21/2018  . Bronchitis due to COVID-19 virus 03/23/2019  . DVT (deep venous thrombosis) (HCC)   . Hemorrhage of rectum and anus 05/06/2017  . Hyperlipidemia   . Hypertension   . Leg pain    right  . PE (pulmonary embolism)   . Sleep apnea     Current Outpatient Medications on File Prior to Visit  Medication Sig Dispense Refill  . albuterol (VENTOLIN HFA) 108 (90 Base) MCG/ACT inhaler Inhale 2 puffs into the lungs every 6 (six) hours as needed for wheezing or shortness of breath. 18 g 2  . guaiFENesin-codeine (ROBITUSSIN AC) 100-10 MG/5ML syrup Take 5-10 mLs by mouth 3 (three) times daily as needed for cough. 236 mL 0  . guaiFENesin-dextromethorphan (ROBITUSSIN DM) 100-10 MG/5ML syrup Take 5-10 mLs by mouth every 8 (eight) hours as needed for cough. 236 mL 1  . loratadine (CLARITIN) 10 MG tablet Take 1 tablet (10 mg total) by mouth daily. 30 tablet 0  . omeprazole (PRILOSEC) 20 MG capsule Take 1 capsule (20 mg total) by mouth daily. 30 capsule 0   No current facility-administered medications on file prior to visit.    Family History  Problem Relation Age of Onset  . Hypertension Other   . Heart disease Other   . Diabetes Other   . Coronary artery disease Other     Social History   Socioeconomic History  . Marital status: Legally Separated    Spouse name: Not on file  . Number of children: Not on file  . Years of education: Not on file  . Highest education level: Not on file  Occupational History  . Not on file  Tobacco Use  . Smoking status: Never Smoker  . Smokeless tobacco: Never Used  Vaping Use  . Vaping Use: Never used  Substance and Sexual Activity  . Alcohol use: No  . Drug use: Not on  file  . Sexual activity: Not Currently  Other Topics Concern  . Not on file  Social History Narrative  . Not on file   Social Determinants of Health   Financial Resource Strain: Not on file  Food Insecurity: Not on file  Transportation Needs: Not on file  Physical Activity: Not on file  Stress: Not on file  Social Connections: Not on file  Intimate Partner Violence: Not on file    Review of Systems: ROS negative except for what is noted on the assessment and plan.  Vitals:   09/05/20 1445  BP: 128/64  Pulse: 83  Temp: 98.1 F (36.7 C)  TempSrc: Oral  SpO2: 96%  Weight: 221 lb 12.8 oz (100.6 kg)  Height: 5\' 3"  (1.6 m)     Physical Exam: Physical Exam Constitutional:      Appearance: Normal appearance.  HENT:     Head: Normocephalic and atraumatic.  Eyes:     Extraocular Movements: Extraocular movements intact.  Cardiovascular:     Rate and Rhythm: Normal rate.     Pulses: Normal pulses.     Heart sounds: Normal heart sounds.  Pulmonary:     Effort: Pulmonary effort is normal.     Breath sounds: Normal breath sounds.  Abdominal:  General: Bowel sounds are normal.     Palpations: Abdomen is soft.     Tenderness: There is no abdominal tenderness.  Musculoskeletal:        General: Normal range of motion.     Cervical back: Normal range of motion.     Right lower leg: No edema.     Left lower leg: No edema.  Skin:    General: Skin is warm and dry.  Neurological:     Mental Status: She is alert and oriented to person, place, and time. Mental status is at baseline.  Psychiatric:        Mood and Affect: Mood normal.      Assessment & Plan:   See Encounters Tab for problem based charting.  Patient discussed with Dr. Jaynie Crumble, D.O. [facility], PGY-2 Pager: [phone], Phone: [phone] Date 09/06/2020 Time 11:24 AM

## 2020-09-05 NOTE — Patient Instructions (Addendum)
Thank you, [name] for allowing [place] to provide your care today. Today we discussed DVT and anticoagulation medication (Xarelto).    I have ordered the following labs for you:  Lab Orders  No laboratory test(s) ordered today     Tests ordered today:  None, but will consider Sleep Study in the near future.  Referrals ordered today:   Referral Orders  No referral(s) requested today     I have ordered the following medication/changed the following medications:   Stop the following medications: None  Start the following medications: Meds ordered this encounter  Medications  . losartan (COZAAR) 100 MG tablet    Sig: Take 1 tablet (100 mg total) by mouth daily.    Dispense:  30 tablet    Refill:  2    IM program  . eplerenone (INSPRA) 25 MG tablet    Sig: Take 0.5 tablets (12.5 mg total) by mouth daily.    Dispense:  15 tablet    Refill:  2    IM program  . furosemide (LASIX) 20 MG tablet    Sig: Take 1 tablet (20 mg total) by mouth daily.    Dispense:  30 tablet    Refill:  2  . rivaroxaban (XARELTO) 20 MG TABS tablet    Sig: Take 1 tablet (20 mg total) by mouth daily with supper.    Dispense:  30 tablet    Refill:  2    IM program  . atorvastatin (LIPITOR) 40 MG tablet    Sig: Take 1 tablet (40 mg total) by mouth daily.    Dispense:  30 tablet    Refill:  2    IM program  . amLODipine (NORVASC) 5 MG tablet    Sig: Take 1 tablet (5 mg total) by mouth daily.    Dispense:  30 tablet    Refill:  2    IM program  . carvedilol (COREG) 6.25 MG tablet    Sig: Take 1 tablet (6.25 mg total) by mouth 2 (two) times daily with a meal.    Dispense:  60 tablet    Refill:  2    IM program     Follow up: 2 months    Remember: to make sure you take your Xarelto every day!  Should you have any questions or concerns please call the internal medicine clinic at [phone].     Dellia Cloud, D.O. [facility]    DASH Eating  Plan DASH stands for "Dietary Approaches to Stop Hypertension." The DASH eating plan is a healthy eating plan that has been shown to reduce high blood pressure (hypertension). It may also reduce your risk for type 2 diabetes, heart disease, and stroke. The DASH eating plan may also help with weight loss. What are tips for following this plan?  General guidelines  Avoid eating more than 2,300 mg (milligrams) of salt (sodium) a day. If you have hypertension, you may need to reduce your sodium intake to 1,500 mg a day.  Limit alcohol intake to no more than 1 drink a day for nonpregnant women and 2 drinks a day for men. One drink equals 12 oz of beer, 5 oz of wine, or 1 oz of hard liquor.  Work with your health care provider to maintain a healthy body weight or to lose weight. Ask what an ideal weight is for you.  Get at least 30 minutes of exercise that causes your heart to beat faster (aerobic  exercise) most days of the week. Activities may include walking, swimming, or biking.  Work with your health care provider or diet and nutrition specialist (dietitian) to adjust your eating plan to your individual calorie needs. Reading food labels   Check food labels for the amount of sodium per serving. Choose foods with less than 5 percent of the Daily Value of sodium. Generally, foods with less than 300 mg of sodium per serving fit into this eating plan.  To find whole grains, look for the word "whole" as the first word in the ingredient list. Shopping  Buy products labeled as "low-sodium" or "no salt added."  Buy fresh foods. Avoid canned foods and premade or frozen meals. Cooking  Avoid adding salt when cooking. Use salt-free seasonings or herbs instead of table salt or sea salt. Check with your health care provider or pharmacist before using salt substitutes.  Do not fry foods. Cook foods using healthy methods such as baking, boiling, grilling, and broiling instead.  Cook with  heart-healthy oils, such as olive, canola, soybean, or sunflower oil. Meal planning 1. Eat a balanced diet that includes: ? 5 or more servings of fruits and vegetables each day. At each meal, try to fill half of your plate with fruits and vegetables. ? Up to 6-8 servings of whole grains each day. ? Less than 6 oz of lean meat, poultry, or fish each day. A 3-oz serving of meat is about the same size as a deck of cards. One egg equals 1 oz. ? 2 servings of low-fat dairy each day. ? A serving of nuts, seeds, or beans 5 times each week. ? Heart-healthy fats. Healthy fats called Omega-3 fatty acids are found in foods such as flaxseeds and coldwater fish, like sardines, salmon, and mackerel. 2. Limit how much you eat of the following: ? Canned or prepackaged foods. ? Food that is high in trans fat, such as fried foods. ? Food that is high in saturated fat, such as fatty meat. ? Sweets, desserts, sugary drinks, and other foods with added sugar. ? Full-fat dairy products. 3. Do not salt foods before eating. 4. Try to eat at least 2 vegetarian meals each week. 5. Eat more home-cooked food and less restaurant, buffet, and fast food. 6. When eating at a restaurant, ask that your food be prepared with less salt or no salt, if possible. What foods are recommended? The items listed may not be a complete list. Talk with your dietitian about what dietary choices are best for you. Grains Whole-grain or whole-wheat bread. Whole-grain or whole-wheat pasta. Brown rice. Orpah Cobbatmeal. Quinoa. Bulgur. Whole-grain and low-sodium cereals. Pita bread. Low-fat, low-sodium crackers. Whole-wheat flour tortillas. Vegetables Fresh or frozen vegetables (raw, steamed, roasted, or grilled). Low-sodium or reduced-sodium tomato and vegetable juice. Low-sodium or reduced-sodium tomato sauce and tomato paste. Low-sodium or reduced-sodium canned vegetables. Fruits All fresh, dried, or frozen fruit. Canned fruit in natural juice  (without added sugar). Meat and other protein foods Skinless chicken or [place]. Ground chicken or [place]. Pork with fat trimmed off. Fish and seafood. Egg whites. Dried beans, peas, or lentils. Unsalted nuts, nut butters, and seeds. Unsalted canned beans. Lean cuts of beef with fat trimmed off. Low-sodium, lean deli meat. Dairy Low-fat (1%) or fat-free (skim) milk. Fat-free, low-fat, or reduced-fat cheeses. Nonfat, low-sodium ricotta or cottage cheese. Low-fat or nonfat yogurt. Low-fat, low-sodium cheese. Fats and oils Soft margarine without trans fats. Vegetable oil. Low-fat, reduced-fat, or light mayonnaise and salad dressings (reduced-sodium). Canola, safflower,  olive, soybean, and sunflower oils. Avocado. Seasoning and other foods Herbs. Spices. Seasoning mixes without salt. Unsalted popcorn and pretzels. Fat-free sweets. What foods are not recommended? The items listed may not be a complete list. Talk with your dietitian about what dietary choices are best for you. Grains Baked goods made with fat, such as croissants, muffins, or some breads. Dry pasta or rice meal packs. Vegetables Creamed or fried vegetables. Vegetables in a cheese sauce. Regular canned vegetables (not low-sodium or reduced-sodium). Regular canned tomato sauce and paste (not low-sodium or reduced-sodium). Regular tomato and vegetable juice (not low-sodium or reduced-sodium). Rosita Fire. Olives. Fruits Canned fruit in a light or heavy syrup. Fried fruit. Fruit in cream or butter sauce. Meat and other protein foods Fatty cuts of meat. Ribs. Fried meat. Tomasa Blase. Sausage. Bologna and other processed lunch meats. Salami. Fatback. Hotdogs. Bratwurst. Salted nuts and seeds. Canned beans with added salt. Canned or smoked fish. Whole eggs or egg yolks. Chicken or [place] with skin. Dairy Whole or 2% milk, cream, and half-and-half. Whole or full-fat cream cheese. Whole-fat or sweetened yogurt. Full-fat cheese. Nondairy creamers. Whipped  toppings. Processed cheese and cheese spreads. Fats and oils Butter. Stick margarine. Lard. Shortening. Ghee. Bacon fat. Tropical oils, such as coconut, palm kernel, or palm oil. Seasoning and other foods Salted popcorn and pretzels. Onion salt, garlic salt, seasoned salt, table salt, and sea salt. Worcestershire sauce. Tartar sauce. Barbecue sauce. Teriyaki sauce. Soy sauce, including reduced-sodium. Steak sauce. Canned and packaged gravies. Fish sauce. Oyster sauce. Cocktail sauce. Horseradish that you find on the shelf. Ketchup. Mustard. Meat flavorings and tenderizers. Bouillon cubes. Hot sauce and Tabasco sauce. Premade or packaged marinades. Premade or packaged taco seasonings. Relishes. Regular salad dressings. Where to find more information:  National Heart, Lung, and Blood Institute: PopSteam.is  American Heart Association: www.heart.org Summary  The DASH eating plan is a healthy eating plan that has been shown to reduce high blood pressure (hypertension). It may also reduce your risk for type 2 diabetes, heart disease, and stroke.  With the DASH eating plan, you should limit salt (sodium) intake to 2,300 mg a day. If you have hypertension, you may need to reduce your sodium intake to 1,500 mg a day.  When on the DASH eating plan, aim to eat more fresh fruits and vegetables, whole grains, lean proteins, low-fat dairy, and heart-healthy fats.  Work with your health care provider or diet and nutrition specialist (dietitian) to adjust your eating plan to your individual calorie needs. This information is not intended to replace advice given to you by your health care provider. Make sure you discuss any questions you have with your health care provider. Document Revised: 07/18/2017 Document Reviewed: 07/29/2016 Elsevier Patient Education  2020 [employer].    Exercising to Stay Healthy To become healthy and stay healthy, it is recommended that you do moderate-intensity and  vigorous-intensity exercise. You can tell that you are exercising at a moderate intensity if your heart starts beating faster and you start breathing faster but can still hold a conversation. You can tell that you are exercising at a vigorous intensity if you are breathing much harder and faster and cannot hold a conversation while exercising. Exercising regularly is important. It has many health benefits, such as:  Improving overall fitness, flexibility, and endurance.  Increasing bone density.  Helping with weight control.  Decreasing body fat.  Increasing muscle strength.  Reducing stress and tension.  Improving overall health. How often should I exercise? Choose an  activity that you enjoy, and set realistic goals. Your health care provider can help you make an activity plan that works for you. Exercise regularly as told by your health care provider. This may include: 7. Doing strength training two times a week, such as: ? Lifting weights. ? Using resistance bands. ? Push-ups. ? Sit-ups. ? Yoga. 8. Doing a certain intensity of exercise for a given amount of time. Choose from these options: ? A total of 150 minutes of moderate-intensity exercise every week. ? A total of 75 minutes of vigorous-intensity exercise every week. ? A mix of moderate-intensity and vigorous-intensity exercise every week. Children, pregnant women, people who have not exercised regularly, people who are overweight, and older adults may need to talk with a health care provider about what activities are safe to do. If you have a medical condition, be sure to talk with your health care provider before you start a new exercise program. What are some exercise ideas? Moderate-intensity exercise ideas include:  Walking 1 mile (1.6 km) in about 15 minutes.  Biking.  Hiking.  Golfing.  Dancing.  Water aerobics. Vigorous-intensity exercise ideas include:  Walking 4.5 miles (7.2 km) or more in about 1  hour.  Jogging or running 5 miles (8 km) in about 1 hour.  Biking 10 miles (16.1 km) or more in about 1 hour.  Lap swimming.  Roller-skating or in-line skating.  Cross-country skiing.  Vigorous competitive sports, such as football, basketball, and soccer.  Jumping rope.  Aerobic dancing.   What are some everyday activities that can help me to get exercise? 1. Yard work, such as: ? Pushing a Surveyor, mining. ? Raking and bagging leaves. 2. Washing your car. 3. Pushing a stroller. 4. Shoveling snow. 5. Gardening. 6. Washing windows or floors. How can I be more active in my day-to-day activities?  Use stairs instead of an elevator.  Take a walk during your lunch break.  If you drive, park your car farther away from your work or school.  If you take public transportation, get off one stop early and walk the rest of the way.  Stand up or walk around during all of your indoor phone calls.  Get up, stretch, and walk around every 30 minutes throughout the day.  Enjoy exercise with a friend. Support to continue exercising will help you keep a regular routine of activity. What guidelines can I follow while exercising?  Before you start a new exercise program, talk with your health care provider.  Do not exercise so much that you hurt yourself, feel dizzy, or get very short of breath.  Wear comfortable clothes and wear shoes with good support.  Drink plenty of water while you exercise to prevent dehydration or heat stroke.  Work out until your breathing and your heartbeat get faster. Where to find more information  U.S. Department of Health and Human Services: ThisPath.fi  Centers for Disease Control and Prevention (CDC): FootballExhibition.com.br Summary  Exercising regularly is important. It will improve your overall fitness, flexibility, and endurance.  Regular exercise also will improve your overall health. It can help you control your weight, reduce stress, and improve your bone  density.  Do not exercise so much that you hurt yourself, feel dizzy, or get very short of breath.  Before you start a new exercise program, talk with your health care provider. This information is not intended to replace advice given to you by your health care provider. Make sure you discuss any questions you have  with your health care provider. Document Revised: 07/18/2017 Document Reviewed: 06/26/2017 Elsevier Patient Education  2021 [employer]

## 2020-09-06 ENCOUNTER — Encounter: Payer: Self-pay | Admitting: Internal Medicine

## 2020-09-06 DIAGNOSIS — Z139 Encounter for screening, unspecified: Secondary | ICD-10-CM | POA: Insufficient documentation

## 2020-09-06 HISTORY — DX: Encounter for screening, unspecified: Z13.9

## 2020-09-06 NOTE — Assessment & Plan Note (Addendum)
Her last lipid panel is shown below: Lipid Panel     Component Value Date/Time   CHOL 123 11/22/2019 0927   TRIG 73 11/22/2019 0927   HDL 44 11/22/2019 0927   CHOLHDL 2.8 11/22/2019 0927   CHOLHDL 5.4 01/19/2009 0227   VLDL 21 01/19/2009 0227   LDLCALC 64 11/22/2019 0927   LABVLDL 15 11/22/2019 0927   Refilled atorvastatin 40 mg today.

## 2020-09-06 NOTE — Assessment & Plan Note (Signed)
Assessment: Presents for a follow up for HTN. Her blood pressure today is 128/64 mm Hg. She is taking losartan 100 mg daily, carvedilol 6.25 mg twice daily, eplerenone 12.5 mg daily, and furosemide 20 mg daily. She states that she is tolerating these medications well. She admits to being on her last day of some of these medications before running out.   Blood Pressure 08/21/2020 12/16/2019 11/22/2019  BP 141/69 116/59 115/76  Some recent data might be hidden    Plan: - Refill medications today.

## 2020-09-06 NOTE — Assessment & Plan Note (Signed)
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 She states that her left arm pain has improved since restarting her Xarelto . She has had some difficulties obtaining her Xarelto  through [facility] lately, which has resulted in receiving a week supply through [facility] and another week supply through [facility]. She states that she still has not received her medication through med [facility] and she is on her last pill today.   Patient is apparently high risk for thrombosis. I explained the importance of taking her Xarelto  no matter what. I informed her to call us  if she runs low in the future.    Plan: 1. Refilled Xarelto  through [facility].

## 2020-09-06 NOTE — Progress Notes (Signed)
Internal Medicine Clinic Attending  Case discussed with Dr. Coe  At the time of the visit.  We reviewed the resident's history and exam and pertinent patient test results.  I agree with the assessment, diagnosis, and plan of care documented in the resident's note.  

## 2020-09-06 NOTE — Assessment & Plan Note (Signed)
Patient having difficult accessing needed medications and medical service. She does not have medical insurance as she only has a part time job. She does not have insurance through the market place at this time either, highlighting financial limitations. Therefore, I put in a referral for chronic care management/social work to help her fill out an application for Medicaid.   Plan: - Social work referral.

## 2020-09-06 NOTE — Assessment & Plan Note (Signed)
Patient has not worsen a CPAP since 2016. She would benefit from a repeat sleep study once she is able to get financial assistance.

## 2020-09-06 NOTE — Assessment & Plan Note (Signed)
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 She states that her left arm pain has improved since restarting her Xarelto. She has had some difficulties obtaining her Xarelto through [facility] lately, which has resulted in receiving a week supply through [facility] and another week supply through [facility]patient pharm. She states that she still has not received her medication through med assist and she is on her last pill today.   Patient is apparently high risk for thrombosis. I explained the importance of taking her Xarelto no matter what. I informed her to call us if she runs low in the future.    Plan: 1. Refilled Xarelto through [facility] outpatient pharmacy.

## 2020-09-06 NOTE — Assessment & Plan Note (Signed)
Patient would benefit from starting an GLP-1a (such as liragluitde or semiglutide) once she get financial assistance or insurance. Unfortunately, these are not on the $4 dollar list at [facility] outpatient pharmacy.   I counseled her on diet and exercise recommendations and gave her additional information to take home with her.

## 2020-09-14 ENCOUNTER — Telehealth: Payer: Self-pay | Admitting: Student

## 2020-09-14 NOTE — Telephone Encounter (Signed)
   Telephone encounter was:  Successful.  09/14/2020 Name: [name] MRN: [id number] DOB: 02-11-1956  [name] is a 65 y.o. year old female who is a primary care patient of Roylene Reason, MD . The community resource team was consulted for assistance with Medicaid Assistance.  Care guide performed the following interventions: Discussed resources to assist with applying for Medicaid. Asked patient if she has called [facility] DSS to apply. Patient stated that she has been meaning to call, but has not called them yet. Informed patient she can give DSS a call and let them know she would like to apply for Medicaid and they will connect her with a caseworker that can assist with completing the application. Patient stated understanding and will give the organization a call. .  Follow Up Plan:  Care guide will follow up with patient by phone over the next week to see if she has been able to connect with [facility] DSS.   [facility] Care Guide, Embedded Care Coordination [facility], Care Management Phone: [phone] Email: [email or web address]

## 2020-09-18 MED FILL — AMLODIPINE BESYLATE 5 MG TA: 5 | 30 days supply | Qty: 30 | Fill #1

## 2020-09-20 NOTE — Telephone Encounter (Signed)
   Telephone encounter was:  Unsuccessful.  09/20/2020 Name: AMERIS AKAMINE MRN: [id number] DOB: 1956-01-30  Unsuccessful outbound call made today to assist with:  Medicaid Assistance  Outreach Attempt:  2nd Attempt  A HIPAA compliant voice message was left requesting a return call.  Instructed patient to call back at [phone].  [facility] Care Guide, Embedded Care Coordination [facility], Care Management Phone: [phone] Email: [email or web address]

## 2020-09-22 NOTE — Telephone Encounter (Signed)
   Telephone encounter was:  Unsuccessful.  09/22/2020 Name: BREUNA LOVEALL MRN: [id number] DOB: 10-21-1955  Unsuccessful outbound call made today to assist with:  Medicaid Assistance  Outreach Attempt:  3rd Attempt.  Referral closed unable to contact patient.  A HIPAA compliant voice message was left requesting a return call.  Instructed patient to call back at [phone].  [facility] Care Guide, Embedded Care Coordination [facility], Care Management Phone: [phone] Email: [email or web address]

## 2020-10-04 ENCOUNTER — Encounter: Payer: Disability Insurance | Admitting: Student

## 2020-10-04 MED FILL — FUROSEMIDE 20 MG TABS: 20 | 30 days supply | Qty: 30 | Fill #1

## 2020-10-04 MED FILL — LOSARTAN POTASSIUM 100 MG T: 100 | 30 days supply | Qty: 30 | Fill #1

## 2020-10-06 MED FILL — XARELTO 20 MG TABLET: 20 | 30 days supply | Qty: 30 | Fill #1

## 2020-10-06 MED FILL — EPLERENONE 25 MG TABS: 25 | 30 days supply | Qty: 15 | Fill #1

## 2020-10-20 MED FILL — AMLODIPINE BESYLATE 5 MG TA: 5 | 30 days supply | Qty: 30 | Fill #2

## 2020-11-02 ENCOUNTER — Other Ambulatory Visit: Payer: Self-pay

## 2020-11-02 ENCOUNTER — Ambulatory Visit: Payer: Self-pay | Admitting: Internal Medicine

## 2020-11-02 ENCOUNTER — Encounter: Payer: Self-pay | Admitting: Internal Medicine

## 2020-11-02 VITALS — BP 101/63 | HR 69 | Temp 98.2°F | Ht 63.0 in | Wt 217.5 lb

## 2020-11-02 DIAGNOSIS — K521 Toxic gastroenteritis and colitis: Secondary | ICD-10-CM

## 2020-11-02 HISTORY — DX: Toxic gastroenteritis and colitis: K52.1

## 2020-11-02 NOTE — Patient Instructions (Signed)
Ms. Mcgahan, Sorry to hear about your GI symptoms.  You likely ate something at the party that caused you to get sick or got a virus in the last few days. As we discussed, your symptoms should resolve on their own. You can continue taking imodium as needed. Slowly start adding things back to your diet as you feel up for it.   If you're taking your Lasix every day, I would hold off through the rest of the weekend to avoid dehydrating yourself further.   I have a written a work note for you to return on Monday.   Take care!

## 2020-11-02 NOTE — Progress Notes (Signed)
Acute Office Visit  Subjective:    Patient ID: [name], female    DOB: 01-28-1956, 65 y.o.   MRN: [id number]  Chief Complaint  Patient presents with  . Diarrhea    Diarrhea since Sunday.  Vomiting stopped on Tuesday  . Abdominal Pain         HPI Patient is in today for n/v, diarrhea that began 5 days ago. Please see problem based charting for details on today's visit.   Past Medical History:  Diagnosis Date  . Acute heart failure (HCC) 05/21/2018  . Bronchitis due to COVID-19 virus 03/23/2019  . DVT (deep venous thrombosis) (HCC)   . Hemorrhage of rectum and anus 05/06/2017  . Hyperlipidemia   . Hypertension   . Leg pain    right  . PE (pulmonary embolism)   . Sleep apnea     Past Surgical History:  Procedure Laterality Date  . ABDOMINAL HYSTERECTOMY    . APPENDECTOMY    . BACK SURGERY    . CHOLECYSTECTOMY     Gall Bladder removal  . COLONOSCOPY WITH PROPOFOL N/A 05/06/2017   Procedure: COLONOSCOPY WITH PROPOFOL;  Surgeon: Charlott Rakes, MD;  Location: [facility];  Service: Endoscopy;  Laterality: N/A;  . LAMINOTOMY     right at L4-5 with a disc bulge and superimposed right lateral recess protrusion encroaching on the L5 root  . SPINE SURGERY     decompression surgery    Family History  Problem Relation Age of Onset  . Hypertension Other   . Heart disease Other   . Diabetes Other   . Coronary artery disease Other     Social History   Socioeconomic History  . Marital status: Legally Separated    Spouse name: Not on file  . Number of children: Not on file  . Years of education: Not on file  . Highest education level: Not on file  Occupational History  . Not on file  Tobacco Use  . Smoking status: Never Smoker  . Smokeless tobacco: Never Used  Vaping Use  . Vaping Use: Never used  Substance and Sexual Activity  . Alcohol use: No  . Drug use: Not on file  . Sexual activity: Not Currently  Other Topics Concern  . Not on file  Social  History Narrative  . Not on file   Social Determinants of Health   Financial Resource Strain: Not on file  Food Insecurity: Not on file  Transportation Needs: Not on file  Physical Activity: Not on file  Stress: Not on file  Social Connections: Not on file  Intimate Partner Violence: Not on file    Outpatient Medications Prior to Visit  Medication Sig Dispense Refill  . albuterol (VENTOLIN HFA) 108 (90 Base) MCG/ACT inhaler Inhale 2 puffs into the lungs every 6 (six) hours as needed for wheezing or shortness of breath. 18 g 2  . amLODipine (NORVASC) 5 MG tablet Take 1 tablet (5 mg total) by mouth daily. 30 tablet 2  . atorvastatin (LIPITOR) 40 MG tablet Take 1 tablet (40 mg total) by mouth daily. 30 tablet 2  . carvedilol (COREG) 6.25 MG tablet Take 1 tablet (6.25 mg total) by mouth 2 (two) times daily with a meal. 60 tablet 2  . eplerenone (INSPRA) 25 MG tablet Take 0.5 tablets (12.5 mg total) by mouth daily. 15 tablet 2  . furosemide (LASIX) 20 MG tablet Take 1 tablet (20 mg total) by mouth daily. 30 tablet 2  .  guaiFENesin-codeine (ROBITUSSIN AC) 100-10 MG/5ML syrup Take 5-10 mLs by mouth 3 (three) times daily as needed for cough. 236 mL 0  . guaiFENesin-dextromethorphan (ROBITUSSIN DM) 100-10 MG/5ML syrup Take 5-10 mLs by mouth every 8 (eight) hours as needed for cough. 236 mL 1  . loratadine (CLARITIN) 10 MG tablet Take 1 tablet (10 mg total) by mouth daily. 30 tablet 0  . losartan (COZAAR) 100 MG tablet Take 1 tablet (100 mg total) by mouth daily. 30 tablet 2  . omeprazole (PRILOSEC) 20 MG capsule Take 1 capsule (20 mg total) by mouth daily. 30 capsule 0  . rivaroxaban (XARELTO) 20 MG TABS tablet Take 1 tablet (20 mg total) by mouth daily with supper. 30 tablet 2   No facility-administered medications prior to visit.    Allergies  Allergen Reactions  . Tramadol Nausea And Vomiting  . Ciprofloxacin Rash    Review of Systems  Constitutional: Negative for chills and fever.   Cardiovascular: Negative for palpitations.  Gastrointestinal: Positive for diarrhea, nausea and vomiting. Negative for abdominal pain, blood in stool and rectal pain.  Neurological: Negative for light-headedness.       Objective:    Physical Exam Constitutional:      General: She is not in acute distress.    Appearance: She is well-developed. She is not ill-appearing.  Eyes:     General: No scleral icterus. Cardiovascular:     Rate and Rhythm: Normal rate.  Pulmonary:     Effort: Pulmonary effort is normal.     Breath sounds: Normal breath sounds.  Abdominal:     General: Bowel sounds are increased. There is no distension.     Palpations: Abdomen is soft.     Tenderness: There is no abdominal tenderness.  Skin:    General: Skin is warm and dry.     Capillary Refill: Capillary refill takes less than 2 seconds.  Neurological:     General: No focal deficit present.     Mental Status: She is alert.  Psychiatric:        Mood and Affect: Mood normal.        Behavior: Behavior normal.     BP 101/63 (BP Location: Left Arm, Patient Position: Sitting, Cuff Size: Large)   Pulse 69   Temp 98.2 F (36.8 C) (Oral)   Ht 5\' 3"  (1.6 m)   Wt 217 lb 8 oz (98.7 kg)   SpO2 99% Comment: room air  BMI 38.53 kg/m  Wt Readings from Last 3 Encounters:  11/02/20 217 lb 8 oz (98.7 kg)  09/05/20 221 lb 12.8 oz (100.6 kg)  08/21/20 220 lb (99.8 kg)    Health Maintenance Due  Topic Date Due  . Hepatitis C Screening  Never done  . COVID-19 Vaccine (1) Never done  . TETANUS/TDAP  Never done  . MAMMOGRAM  Never done  . PAP SMEAR-Modifier  10/08/2019  . INFLUENZA VACCINE  Never done    There are no preventive care reminders to display for this patient.   Lab Results  Component Value Date   WBC 7.2 05/21/2018   HGB 14.8 05/21/2018   HCT 47.3 (H) 05/21/2018   MCV 86.3 05/21/2018   PLT 231 05/21/2018   Lab Results  Component Value Date   NA 145 (H) 08/21/2020   K 3.6 08/21/2020    CO2 25 08/21/2020   GLUCOSE 79 08/21/2020   BUN 18 08/21/2020   CREATININE 0.99 08/21/2020   BILITOT 0.7 06/29/2018   ALKPHOS 129 (  H) 06/29/2018   AST 18 06/29/2018   ALT 17 06/29/2018   PROT 6.8 06/29/2018   ALBUMIN 4.3 06/29/2018   CALCIUM 8.6 (L) 08/21/2020   ANIONGAP 10 05/22/2018   Lab Results  Component Value Date   CHOL 123 11/22/2019   Lab Results  Component Value Date   HDL 44 11/22/2019   Lab Results  Component Value Date   LDLCALC 64 11/22/2019   Lab Results  Component Value Date   TRIG 73 11/22/2019   Lab Results  Component Value Date   CHOLHDL 2.8 11/22/2019   Lab Results  Component Value Date   HGBA1C 5.6 05/22/2018       Assessment & Plan:   Problem List Items Addressed This Visit      Digestive   Gastroenteritis due to food toxin - Primary    Ms. Sievers presents with 5 day history of GI upset. She reports symptoms started the morning after she was at a birthday party where she ate pizza and chicken wings. The following morning she began vomiting and having diarrhea. Vomiting resolved after 2 days, but diarrhea has persisted which she describes at 3-5 loose, watery stools per day. Reports other attendees of the party had similar symptoms.  She denies fevers, chills, abdominal pain or bloody stools. She has been able to eat crackers and keep up with her hydration.  A/P Symptoms consistent with gastroenteritis from food ingestion. Advised her this is self-limited. She can take imodium as needed, continue bland diet and advance as tolerated. She takes Lasix daily which I instructed her to hold through the rest of the week until her symptoms improve to avoid dehydration.           No orders of the defined types were placed in this encounter.    Bridget Hartshorn, DO

## 2020-11-02 NOTE — Assessment & Plan Note (Signed)
[name] presents with 5 day history of GI upset. She reports symptoms started the morning after she was at a birthday party where she ate pizza and chicken wings. The following morning she began vomiting and having diarrhea. Vomiting resolved after 2 days, but diarrhea has persisted which she describes at 3-5 loose, watery stools per day. Reports other attendees of the party had similar symptoms.  She denies fevers, chills, abdominal pain or bloody stools. She has been able to eat crackers and keep up with her hydration.  A/P Symptoms consistent with gastroenteritis from food ingestion. Advised her this is self-limited. She can take imodium as needed, continue bland diet and advance as tolerated. She takes Lasix daily which I instructed her to hold through the rest of the week until her symptoms improve to avoid dehydration.

## 2020-11-03 NOTE — Progress Notes (Signed)
Internal Medicine Clinic Attending  Case discussed with Dr. Bloomfield at the time of the visit.  We reviewed the resident's history and exam and pertinent patient test results.  I agree with the assessment, diagnosis, and plan of care documented in the resident's note.  Alexander Raines, M.D., Ph.D.  

## 2020-11-06 MED FILL — FUROSEMIDE 20 MG TABS: 20 | 30 days supply | Qty: 30 | Fill #2

## 2020-11-06 MED FILL — EPLERENONE 25 MG TABS: 25 | 30 days supply | Qty: 15 | Fill #2

## 2020-11-06 MED FILL — LOSARTAN POTASSIUM 100 MG T: 100 | 30 days supply | Qty: 30 | Fill #2

## 2020-11-06 MED FILL — XARELTO 20 MG TABLET: 20 | 30 days supply | Qty: 30 | Fill #2

## 2020-11-13 MED FILL — ATORVASTATIN 40 MG TABLET: 40 | 30 days supply | Qty: 30 | Fill #1

## 2020-11-13 MED FILL — CARVEDILOL 6.25 MG TABLET: 6.25 | 30 days supply | Qty: 60 | Fill #1

## 2020-11-18 ENCOUNTER — Other Ambulatory Visit (HOSPITAL_COMMUNITY): Payer: Self-pay

## 2020-11-20 ENCOUNTER — Other Ambulatory Visit (HOSPITAL_COMMUNITY): Payer: Self-pay

## 2020-11-20 MED FILL — Amlodipine Besylate Tab 5 MG (Base Equivalent): ORAL | 30 days supply | Qty: 30 | Fill #0 | Status: AC

## 2020-12-05 ENCOUNTER — Other Ambulatory Visit: Payer: Self-pay | Admitting: Internal Medicine

## 2020-12-05 DIAGNOSIS — Z86711 Personal history of pulmonary embolism: Secondary | ICD-10-CM

## 2020-12-05 DIAGNOSIS — I5030 Unspecified diastolic (congestive) heart failure: Secondary | ICD-10-CM

## 2020-12-05 DIAGNOSIS — I10 Essential (primary) hypertension: Secondary | ICD-10-CM

## 2020-12-06 ENCOUNTER — Other Ambulatory Visit: Payer: Self-pay | Admitting: Internal Medicine

## 2020-12-06 ENCOUNTER — Other Ambulatory Visit (HOSPITAL_COMMUNITY): Payer: Self-pay

## 2020-12-06 DIAGNOSIS — I10 Essential (primary) hypertension: Secondary | ICD-10-CM

## 2020-12-06 DIAGNOSIS — I5030 Unspecified diastolic (congestive) heart failure: Secondary | ICD-10-CM

## 2020-12-06 MED ORDER — LOSARTAN POTASSIUM 100 MG PO TABS
100.0000 mg | ORAL_TABLET | Freq: Every day | ORAL | 2 refills | Status: DC
  Filled 2020-12-06: qty 30, 30d supply, fill #0

## 2020-12-06 MED ORDER — RIVAROXABAN 20 MG PO TABS
20.0000 mg | ORAL_TABLET | Freq: Every day | ORAL | 2 refills | Status: DC
  Filled 2020-12-06: qty 30, 30d supply, fill #0
  Filled 2021-01-12: qty 30, 30d supply, fill #1
  Filled 2021-02-15: qty 30, 30d supply, fill #2

## 2020-12-06 MED ORDER — EPLERENONE 25 MG PO TABS
12.5000 mg | ORAL_TABLET | Freq: Every day | ORAL | 2 refills | Status: DC
  Filled 2020-12-06: qty 15, 30d supply, fill #0

## 2020-12-06 MED ORDER — FUROSEMIDE 20 MG PO TABS
20.0000 mg | ORAL_TABLET | Freq: Every day | ORAL | 2 refills | Status: DC
  Filled 2020-12-06: qty 30, 30d supply, fill #0
  Filled 2021-01-11: qty 30, 30d supply, fill #1
  Filled 2021-02-08: qty 30, 30d supply, fill #2

## 2020-12-06 NOTE — Telephone Encounter (Signed)
non formulary pls send alternate

## 2020-12-08 ENCOUNTER — Other Ambulatory Visit (HOSPITAL_COMMUNITY): Payer: Self-pay

## 2020-12-08 NOTE — Telephone Encounter (Signed)
Did they say what was her formulary?

## 2020-12-12 ENCOUNTER — Telehealth: Payer: Self-pay

## 2020-12-12 ENCOUNTER — Other Ambulatory Visit (HOSPITAL_COMMUNITY): Payer: Self-pay

## 2020-12-12 MED FILL — Carvedilol Tab 6.25 MG: ORAL | 30 days supply | Qty: 60 | Fill #0 | Status: AC

## 2020-12-12 NOTE — Telephone Encounter (Signed)
Sounds good. I look forward to seeing her tomorrow.

## 2020-12-12 NOTE — Telephone Encounter (Signed)
Return pt's call - c/o cough x 2 yrs, since she had covid. Requesting rx for benzonatate. Informed pt she will need an appt. Last time tested was last month which was negative. Denies any other covid symptoms. In-person appt scheduled for tomorrow 4/27 @ 1545 PM with Dr Kirke Corin.

## 2020-12-12 NOTE — Telephone Encounter (Signed)
Pt is requesting a callback [phone]

## 2020-12-13 ENCOUNTER — Other Ambulatory Visit: Payer: Self-pay

## 2020-12-13 ENCOUNTER — Encounter: Payer: Self-pay | Admitting: Student

## 2020-12-13 ENCOUNTER — Other Ambulatory Visit (HOSPITAL_COMMUNITY): Payer: Self-pay

## 2020-12-13 ENCOUNTER — Ambulatory Visit: Payer: Self-pay | Admitting: Student

## 2020-12-13 VITALS — BP 135/81 | HR 81 | Temp 98.1°F | Wt 220.5 lb

## 2020-12-13 DIAGNOSIS — G4733 Obstructive sleep apnea (adult) (pediatric): Secondary | ICD-10-CM

## 2020-12-13 DIAGNOSIS — I10 Essential (primary) hypertension: Secondary | ICD-10-CM

## 2020-12-13 DIAGNOSIS — R053 Chronic cough: Secondary | ICD-10-CM

## 2020-12-13 DIAGNOSIS — I5030 Unspecified diastolic (congestive) heart failure: Secondary | ICD-10-CM

## 2020-12-13 MED ORDER — BENZONATATE 100 MG PO CAPS
100.0000 mg | ORAL_CAPSULE | Freq: Three times a day (TID) | ORAL | 0 refills | Status: DC | PRN
  Filled 2020-12-13: qty 30, 10d supply, fill #0

## 2020-12-13 MED ORDER — CHLORTHALIDONE 25 MG PO TABS
12.5000 mg | ORAL_TABLET | Freq: Every day | ORAL | 2 refills | Status: DC
  Filled 2020-12-13: qty 15, 30d supply, fill #0
  Filled 2021-01-16: qty 15, 30d supply, fill #1
  Filled 2021-02-15: qty 15, 30d supply, fill #2

## 2020-12-13 MED FILL — Atorvastatin Calcium Tab 40 MG (Base Equivalent): ORAL | 30 days supply | Qty: 30 | Fill #0 | Status: AC

## 2020-12-13 NOTE — Assessment & Plan Note (Signed)
Stable. No JVD, crackles or lower extremity edema on exam.  Patient does have occasional dyspnea on exertion but no PND. Sleeps on only 1 pillow at night.  Plan: --Continue furosemide 20 mg daily -- BMP at next office visit

## 2020-12-13 NOTE — Patient Instructions (Addendum)
Thank you, Ms.[name] for allowing [place] to provide your care today. Today we discussed your chronic cough.     I have ordered the following tests: Chest X-ray  I have ordered the following medication/changed the following medications:  1. Start tessalon Perles 100 mg three times daily as needed for cough 2. Stop losartan 25 mg  3. Start chlorthalidone 12.5 mg (1/2 tab) daily  Please follow-up in 4 weeks  Should you have any questions or concerns please call the internal medicine clinic at [phone].    Sharrell Ku, MD, MPH [facility]   My Chart Access: [email or web address]   If you have not already done so, please get your COVID 19 vaccine  To schedule an appointment for a COVID vaccine choice any of the following: Go to [email or web address]   Go to [email or web address]                  Call [phone]                                     Call [phone] and select Option 2

## 2020-12-13 NOTE — Assessment & Plan Note (Signed)
Vitals:   12/13/20 1552  BP: 135/81   Patient's BP stable and well-controlled on current regimen. Patient here for chronic dry cough for the past 2 years after infection with COVID. Plan to discontinue losartan to assess for improvement in chronic cough.  We will add chlorthalidone the current regiment and monitor blood pressure.  Plan: --Continue on Coreg 6.25 mg twice daily --Continue amlodipine 5 mg daily --Continue eplerenone 12.5 mg daily --Continue Lasix 20 mg daily --Discontinue losartan 100 mg daily --Start chlorthalidone 12.5 mg daily --Advised to check BP at home --Follow-up in 4 weeks, recheck BP and increase dose of chlorthalidone as needed for good BP control.

## 2020-12-13 NOTE — Assessment & Plan Note (Addendum)
Presents for evaluation of chronic cough. Patient reports she has had this dry cough since her COVID infection 2 years ago. She has tried albuterol inhaler, multiple cough syrups and cough drops with no relief.  She reports associated wheezing at night and occasional dyspnea on exertion but denies any shortness of breath at rest, chest pain, leg swelling, PND or palpitations. On exam, patient has mild respiratory wheezes in the upper bases but no JVD, crackles or lower extremity edema.  Patient's chronic cough likely due to persistent cough post-COVID infection but will like to assess for possible reversible and treatable causes of chronic cough.  Plan: --Start Tessalon Perles 100 mg 3 times a day as needed for cough --Discontinue losartan --Chest xray to assess for interstitial disease, masses or pulmonary edema. --Follow-up in 4 weeks to assess for improvements in cough. --Patient will need PFT in the future once she is able to get her orange card.

## 2020-12-13 NOTE — Assessment & Plan Note (Signed)
>>  ASSESSMENT AND PLAN FOR CHRONIC COUGH WRITTEN ON 12/13/2020  5:08 PM BY AMPONSAH, Charisse March, MD  Presents for evaluation of chronic cough. Patient reports she has had this dry cough since her COVID infection 2 years ago. She has tried albuterol inhaler, multiple cough syrups and cough drops with no relief.  She reports associated wheezing at night and occasional dyspnea on exertion but denies any shortness of breath at rest, chest pain, leg swelling, PND or palpitations. On exam, patient has mild respiratory wheezes in the upper bases but no JVD, crackles or lower extremity edema.  Patient's chronic cough likely due to persistent cough post-COVID infection but will like to assess for possible reversible and treatable causes of chronic cough.  Plan: --Start Tessalon Perles 100 mg 3 times a day as needed for cough --Discontinue losartan --Chest xray to assess for interstitial disease, masses or pulmonary edema. --Follow-up in 4 weeks to assess for improvements in cough. --Patient will need PFT in the future once she is able to get her orange card.

## 2020-12-13 NOTE — Progress Notes (Signed)
   CC: Chronic cough  HPI:  [name] is a 65 y.o. female with PMH as below who presents to clinic for evaluation of a chronic cough. Please see problem based charting for evaluation, assessment and plan.  Past Medical History:  Diagnosis Date  . Acute heart failure (HCC) 05/21/2018  . Bronchitis due to COVID-19 virus 03/23/2019  . DVT (deep venous thrombosis) (HCC)   . Hemorrhage of rectum and anus 05/06/2017  . Hyperlipidemia   . Hypertension   . Leg pain    right  . PE (pulmonary embolism)   . Sleep apnea     Review of Systems:  Constitutional: Negative for fever, chills or fatigue HEENT: Negative for runny nose, visual changes Respiratory: Negative for shortness of breath.  Positive for chronic dry cough and dyspnea on exertion. Cardiac: Negative for chest pain or palpitations Neuro: Negative for headache or weakness  Physical Exam: General: Pleasant, obese elderly woman. No acute distress. Neck: Supple. No JVD. Normal ROM Cardiac: RRR. No murmurs, rubs or gallops. No LE edema Respiratory: Lungs CTAB. Mild expiratory wheezes in the upper lung fields. No crackles. Abdominal: Soft, symmetric and non tender. Normal BS. Skin: Warm, dry and intact without rashes or lesions Extremities: Atraumatic. Full ROM. Pulse palpable. Neuro: A&O x 3. Moves all extremities.   Vitals:   12/13/20 1552  BP: 135/81  Pulse: 81  Temp: 98.1 F (36.7 C)  TempSrc: Oral  SpO2: 98%  Weight: 220 lb 8 oz (100 kg)    Assessment & Plan:   See Encounters Tab for problem based charting.  Patient discussed with Dr. Anthoney Harada, MD, MPH

## 2020-12-13 NOTE — Assessment & Plan Note (Addendum)
Patient states she was diagnosed with sleep apnea about 12 years ago and was on CPAP at night but left it behind when her family left [place].  She has not had CPAP since 2016.  She is working on getting financial assistance but her roommate refuses to sign the form due to fear that she will be responsible for her medical bills. Patient working on alternative plan to get her forms signed.  Plan: -- Will need repeat sleep study after patient gets orange card.

## 2020-12-14 NOTE — Progress Notes (Signed)
Internal Medicine Clinic Attending  Case discussed with Dr. Amponsah  At the time of the visit.  We reviewed the resident's history and exam and pertinent patient test results.  I agree with the assessment, diagnosis, and plan of care documented in the resident's note.  

## 2020-12-19 ENCOUNTER — Other Ambulatory Visit (HOSPITAL_COMMUNITY): Payer: Self-pay

## 2020-12-19 MED FILL — Amlodipine Besylate Tab 5 MG (Base Equivalent): ORAL | 30 days supply | Qty: 30 | Fill #1 | Status: AC

## 2021-01-11 ENCOUNTER — Other Ambulatory Visit (HOSPITAL_COMMUNITY): Payer: Self-pay

## 2021-01-12 ENCOUNTER — Other Ambulatory Visit (HOSPITAL_COMMUNITY): Payer: Self-pay

## 2021-01-16 ENCOUNTER — Other Ambulatory Visit (HOSPITAL_COMMUNITY): Payer: Self-pay

## 2021-01-16 ENCOUNTER — Other Ambulatory Visit: Payer: Self-pay | Admitting: Internal Medicine

## 2021-01-16 DIAGNOSIS — I10 Essential (primary) hypertension: Secondary | ICD-10-CM

## 2021-01-18 ENCOUNTER — Other Ambulatory Visit (HOSPITAL_COMMUNITY): Payer: Self-pay

## 2021-01-18 ENCOUNTER — Other Ambulatory Visit: Payer: Self-pay | Admitting: Internal Medicine

## 2021-01-18 DIAGNOSIS — I10 Essential (primary) hypertension: Secondary | ICD-10-CM

## 2021-01-18 MED ORDER — CARVEDILOL 6.25 MG PO TABS
6.2500 mg | ORAL_TABLET | Freq: Two times a day (BID) | ORAL | 2 refills | Status: DC
Start: 2021-01-18 — End: 2021-05-01
  Filled 2021-01-18: qty 60, 30d supply, fill #0
  Filled 2021-02-19: qty 60, 30d supply, fill #1
  Filled 2021-03-21: qty 60, 30d supply, fill #2

## 2021-01-18 MED ORDER — ATORVASTATIN CALCIUM 40 MG PO TABS
40.0000 mg | ORAL_TABLET | Freq: Every day | ORAL | 2 refills | Status: DC
  Filled 2021-01-18: qty 30, 30d supply, fill #0
  Filled 2021-02-19: qty 30, 30d supply, fill #1
  Filled 2021-03-27: qty 30, 30d supply, fill #2

## 2021-01-18 MED FILL — Amlodipine Besylate Tab 5 MG (Base Equivalent): ORAL | 30 days supply | Qty: 30 | Fill #2 | Status: AC

## 2021-01-21 ENCOUNTER — Encounter: Payer: Self-pay | Admitting: *Deleted

## 2021-01-22 ENCOUNTER — Encounter (HOSPITAL_COMMUNITY): Payer: Self-pay | Admitting: Emergency Medicine

## 2021-01-22 ENCOUNTER — Emergency Department (HOSPITAL_COMMUNITY): Payer: Disability Insurance

## 2021-01-22 ENCOUNTER — Ambulatory Visit (INDEPENDENT_AMBULATORY_CARE_PROVIDER_SITE_OTHER): Payer: Self-pay | Admitting: Student

## 2021-01-22 ENCOUNTER — Telehealth: Payer: Self-pay

## 2021-01-22 ENCOUNTER — Emergency Department (HOSPITAL_COMMUNITY)
Admission: EM | Admit: 2021-01-22 | Discharge: 2021-01-23 | Disposition: A | Discharge status: Home / Self Care | Payer: Disability Insurance | Attending: Emergency Medicine | Admitting: Emergency Medicine

## 2021-01-22 ENCOUNTER — Other Ambulatory Visit: Payer: Self-pay

## 2021-01-22 DIAGNOSIS — I110 Hypertensive heart disease with heart failure: Secondary | ICD-10-CM | POA: Insufficient documentation

## 2021-01-22 DIAGNOSIS — Z79899 Other long term (current) drug therapy: Secondary | ICD-10-CM | POA: Insufficient documentation

## 2021-01-22 DIAGNOSIS — E669 Obesity, unspecified: Secondary | ICD-10-CM

## 2021-01-22 DIAGNOSIS — J189 Pneumonia, unspecified organism: Secondary | ICD-10-CM | POA: Insufficient documentation

## 2021-01-22 DIAGNOSIS — I509 Heart failure, unspecified: Secondary | ICD-10-CM | POA: Insufficient documentation

## 2021-01-22 DIAGNOSIS — R058 Other specified cough: Secondary | ICD-10-CM | POA: Insufficient documentation

## 2021-01-22 DIAGNOSIS — R5383 Other fatigue: Secondary | ICD-10-CM | POA: Insufficient documentation

## 2021-01-22 DIAGNOSIS — Z86711 Personal history of pulmonary embolism: Secondary | ICD-10-CM

## 2021-01-22 DIAGNOSIS — Z20822 Contact with and (suspected) exposure to covid-19: Secondary | ICD-10-CM | POA: Insufficient documentation

## 2021-01-22 LAB — CBC WITH DIFFERENTIAL/PLATELET
Abs Immature Granulocytes: 0.02 10*3/uL (ref 0.00–0.07)
Basophils Absolute: 0 10*3/uL (ref 0.0–0.1)
Basophils Relative: 0 %
Eosinophils Absolute: 0.3 10*3/uL (ref 0.0–0.5)
Eosinophils Relative: 3 %
HCT: 44.3 % (ref 36.0–46.0)
Hemoglobin: 14.2 g/dL (ref 12.0–15.0)
Immature Granulocytes: 0 %
Lymphocytes Relative: 18 %
Lymphs Abs: 1.8 10*3/uL (ref 0.7–4.0)
MCH: 27.1 pg (ref 26.0–34.0)
MCHC: 32.1 g/dL (ref 30.0–36.0)
MCV: 84.5 fL (ref 80.0–100.0)
Monocytes Absolute: 1.1 10*3/uL — ABNORMAL HIGH (ref 0.1–1.0)
Monocytes Relative: 11 %
Neutro Abs: 6.9 10*3/uL (ref 1.7–7.7)
Neutrophils Relative %: 68 %
Platelets: 277 10*3/uL (ref 150–400)
RBC: 5.24 MIL/uL — ABNORMAL HIGH (ref 3.87–5.11)
RDW: 13.3 % (ref 11.5–15.5)
WBC: 10.1 10*3/uL (ref 4.0–10.5)
nRBC: 0 % (ref 0.0–0.2)

## 2021-01-22 LAB — COMPREHENSIVE METABOLIC PANEL
ALT: 17 U/L (ref 0–44)
AST: 21 U/L (ref 15–41)
Albumin: 3.7 g/dL (ref 3.5–5.0)
Alkaline Phosphatase: 124 U/L (ref 38–126)
Anion gap: 10 (ref 5–15)
BUN: 13 mg/dL (ref 8–23)
CO2: 28 mmol/L (ref 22–32)
Calcium: 9.2 mg/dL (ref 8.9–10.3)
Chloride: 99 mmol/L (ref 98–111)
Creatinine, Ser: 1.03 mg/dL — ABNORMAL HIGH (ref 0.44–1.00)
GFR, Estimated: >60 mL/min (ref >60)
Glucose, Bld: 112 mg/dL — ABNORMAL HIGH (ref 70–99)
Potassium: 3 mmol/L — ABNORMAL LOW (ref 3.5–5.1)
Sodium: 137 mmol/L (ref 135–145)
Total Bilirubin: 0.6 mg/dL (ref 0.3–1.2)
Total Protein: 7.2 g/dL (ref 6.5–8.1)

## 2021-01-22 LAB — TROPONIN I (HIGH SENSITIVITY): Troponin I (High Sensitivity): 7 ng/L (ref <18)

## 2021-01-22 LAB — BRAIN NATRIURETIC PEPTIDE: B Natriuretic Peptide: 16.7 pg/mL (ref 0.0–100.0)

## 2021-01-22 LAB — RESP PANEL BY RT-PCR (FLU A&B, COVID) ARPGX2
Influenza A by PCR: NEGATIVE
Influenza B by PCR: NEGATIVE
SARS Coronavirus 2 by RT PCR: NEGATIVE

## 2021-01-22 NOTE — Telephone Encounter (Signed)
Received TC from patient who is c/o productive cough w/ "greenish sputum" thinks she has a sinus infection.  States she has nasal/eye drainage as well, onset was Saturday.  Requesting an appt.  Ret team full, appt made for today at 1545/[place], Telehealth SChaplin, RN,BSN

## 2021-01-22 NOTE — ED Triage Notes (Signed)
Patient reports SOB with chest tightness and dry cough onset Saturday last week . No fever or chills .

## 2021-01-22 NOTE — ED Provider Notes (Signed)
Emergency Medicine Provider Triage Evaluation Note  [name] , a 65 y.o. female  was evaluated in triage.  Pt complains of SOB, congestion, and sinus drainage. Symptoms began 3 days prior. No fever. No leg swelling.   Review of Systems  Positive: SOB and cough with congestion  Negative: CP, abdominal pain, vomiting, or diarrhea.   Physical Exam  BP 124/69 (BP Location: Left Arm)   Pulse 80   Temp 99.5 F (37.5 C) (Oral)   Resp 16   Ht 5\' 3"  (1.6 m)   Wt 109 kg   SpO2 96%   BMI 42.57 kg/m  Gen:   Awake, no distress   Resp:  Normal effort. Frequent cough. No appreciable wheezing  MSK:   Moves extremities without difficulty. Minimal leg swelling    Medical Decision Making  Medically screening exam initiated at 8:03 PM.  Appropriate orders placed.  was informed that the remainder of the evaluation will be completed by another provider, this initial triage assessment does not replace that evaluation, and the importance of remaining in the ED until their evaluation is complete.  Plan for CXR, EKG, labs given med history and COVID swab.    [name], MD 01/22/21 2004

## 2021-01-22 NOTE — Progress Notes (Signed)
  [facility] Residency Telephone Encounter Continuity Care Appointment  HPI:   This telephone encounter was created for [name] on 01/22/2021 for the following purpose/cc acute on chronic cough, chills.   Past Medical History:  Past Medical History:  Diagnosis Date  . Acute heart failure (HCC) 05/21/2018  . Bronchitis due to COVID-19 virus 03/23/2019  . DVT (deep venous thrombosis) (HCC)   . Hemorrhage of rectum and anus 05/06/2017  . Hyperlipidemia   . Hypertension   . Leg pain    right  . PE (pulmonary embolism)   . Sleep apnea       ROS:   As per HPI   Assessment / Plan / Recommendations:   Please see A&P under problem oriented charting for assessment of the patient's acute and chronic medical conditions.   As always, pt is advised that if symptoms worsen or new symptoms arise, they should go to an urgent care facility or to to ER for further evaluation.   Consent and Medical Decision Making:   Patient discussed with Dr. Mayford Knife  This is a telephone encounter between [name] and Evlyn Kanner on 01/22/2021 for acute on chronic cough, chills. The visit was conducted with the patient located at home and Evlyn Kanner at [facility]. The patient's identity was confirmed using their DOB and current address. The his/her legal guardian has consented to being evaluated through a telephone encounter and understands the associated risks (an examination cannot be done and the patient may need to come in for an appointment) / benefits (allows the patient to remain at home, decreasing exposure to coronavirus). I personally spent 8 minutes on medical discussion.

## 2021-01-22 NOTE — Assessment & Plan Note (Addendum)
[name] is presenting via telephone for acute on chronic productive cough with chills. She reports her grandchildren were around her late last week while they were sick. She began having symptoms two days ago. Currently she feels out of breath at rest with worsening cough than usual. In addition to this, she has had chills but denies known fevers. Denies muscles aches. She has been able to eat some without nausea, vomiting, or abdominal pain.  [name] has a history of heart failure, hypertension, PE, and obesity, which all put her at high risk for severe COVID-19. She is vaccinated twice, but cannot take the booster until July. Currently she sounds very dyspneic over the phone and I am worried for a possible underlying pneumonia vs COVID-19. I instructed [name] to go to the Emergency Department for further evaluation. Patient verbalized understanding and agreed to the plan.

## 2021-01-23 ENCOUNTER — Other Ambulatory Visit (HOSPITAL_COMMUNITY): Payer: Self-pay

## 2021-01-23 ENCOUNTER — Other Ambulatory Visit: Payer: Self-pay

## 2021-01-23 LAB — TROPONIN I (HIGH SENSITIVITY): Troponin I (High Sensitivity): 8 ng/L (ref <18)

## 2021-01-23 MED ORDER — ONDANSETRON 4 MG PO TBDP
4.0000 mg | ORAL_TABLET | ORAL | 0 refills | Status: DC | PRN
  Filled 2021-01-23: qty 20, 4d supply, fill #0

## 2021-01-23 MED ORDER — HYDROCOD POLST-CPM POLST ER 10-8 MG/5ML PO SUER
5.0000 mL | Freq: Two times a day (BID) | ORAL | 0 refills | Status: DC | PRN
  Filled 2021-01-23: qty 50, 5d supply, fill #0

## 2021-01-23 MED ORDER — DOXYCYCLINE HYCLATE 100 MG PO TABS
100.0000 mg | ORAL_TABLET | Freq: Once | ORAL | Status: AC
  Administered 2021-01-23: 100 mg via ORAL
  Filled 2021-01-23: qty 1

## 2021-01-23 MED ORDER — DOXYCYCLINE HYCLATE 100 MG PO CAPS
100.0000 mg | ORAL_CAPSULE | Freq: Two times a day (BID) | ORAL | 0 refills | Status: AC
  Filled 2021-01-23: qty 14, 7d supply, fill #0

## 2021-01-23 NOTE — Discharge Instructions (Addendum)
Be careful with the narcotic cough syrup it can make you sleepy and more easy to fall down and will constipate you.  Take tylenol 2 pills 4 times a day and motrin 4 pills 3 times a day.  Drink plenty of fluids.  Return for worsening shortness of breath, headache, confusion. Follow up with your family doctor.

## 2021-01-23 NOTE — ED Provider Notes (Signed)
[facility] EMERGENCY DEPARTMENT Provider Note   CSN: [id number] Arrival date & time: 01/22/21  1727     History Chief Complaint  Patient presents with  . Shortness of Breath    [name] is a 65 y.o. female.  65 yo F with a chief complaints of cough and congestion.  Going on for a few days now.  Her grandchildren have been sick with a similar illness.  Able to eat and drink without issue.  No nausea vomiting or diarrhea.  Some mild fatigue with this.  No significant shortness of breath.  Mostly complaining of sinus congestion and cough.  Has tried prescription cough medicine without improvement.  The history is provided by the patient.  Shortness of Breath Severity:  Moderate Onset quality:  Gradual Duration:  3 days Timing:  Constant Progression:  Worsening Chronicity:  New Relieved by:  Nothing Worsened by:  Nothing Ineffective treatments:  None tried Associated symptoms: cough and fever   Associated symptoms: no chest pain, no headaches, no vomiting and no wheezing        Past Medical History:  Diagnosis Date  . Acute heart failure (HCC) 05/21/2018  . Bronchitis due to COVID-19 virus 03/23/2019  . DVT (deep venous thrombosis) (HCC)   . Hemorrhage of rectum and anus 05/06/2017  . Hyperlipidemia   . Hypertension   . Leg pain    right  . PE (pulmonary embolism)   . Sleep apnea     Patient Active Problem List   Diagnosis Date Noted  . Cough productive of yellow sputum 01/22/2021  . Gastroenteritis due to food toxin 11/02/2020  . Encounter for screening involving social determinants of health (SDoH) 09/06/2020  . Deep vein thrombosis (DVT) (HCC) 08/22/2020  . Breast cancer screening by mammogram 11/22/2019  . Chronic cough 04/20/2019  . (HFpEF) heart failure with preserved ejection fraction (HCC) 05/29/2018  . History of pulmonary embolus (PE) 04/12/2016  . Allergic rhinitis 04/12/2016  . Healthcare maintenance 04/12/2016  .  Hyperlipidemia 01/30/2009  . Obesity (BMI 30-39.9) 01/30/2009  . SLEEP APNEA, OBSTRUCTIVE 01/30/2009  . Essential hypertension 01/30/2009    Past Surgical History:  Procedure Laterality Date  . ABDOMINAL HYSTERECTOMY    . APPENDECTOMY    . BACK SURGERY    . CHOLECYSTECTOMY     Gall Bladder removal  . COLONOSCOPY WITH PROPOFOL N/A 05/06/2017   Procedure: COLONOSCOPY WITH PROPOFOL;  Surgeon: Charlott Rakes, MD;  Location: [facility];  Service: Endoscopy;  Laterality: N/A;  . LAMINOTOMY     right at L4-5 with a disc bulge and superimposed right lateral recess protrusion encroaching on the L5 root  . SPINE SURGERY     decompression surgery     OB History   No obstetric history on file.     Family History  Problem Relation Age of Onset  . Hypertension Other   . Heart disease Other   . Diabetes Other   . Coronary artery disease Other     Social History   Tobacco Use  . Smoking status: Never Smoker  . Smokeless tobacco: Never Used  Vaping Use  . Vaping Use: Never used  Substance Use Topics  . Alcohol use: No    Home Medications Prior to Admission medications   Medication Sig Start Date End Date Taking? Authorizing Provider  atorvastatin (LIPITOR) 40 MG tablet Take 1 tablet (40 mg total) by mouth daily. 01/18/21   Roylene Reason, MD  chlorpheniramine-HYDROcodone [facility] PENNKINETIC ER) 10-8 MG/5ML  SUER Take 5 mLs by mouth every 12 (twelve) hours as needed for cough. 01/23/21  Yes Melene PlanFloyd, Asiyah Pineau, DO  doxycycline (VIBRAMYCIN) 100 MG capsule Take 1 capsule (100 mg total) by mouth 2 (two) times daily. One po bid x 7 days 01/23/21  Yes Melene PlanFloyd, Kayde Atkerson, DO  ondansetron (ZOFRAN ODT) 4 MG disintegrating tablet 4mg  ODT q4 hours prn nausea/vomit 01/23/21  Yes Argelio Granier, DO  amLODipine (NORVASC) 5 MG tablet TAKE 1 TABLET (5 MG TOTAL) BY MOUTH DAILY. 09/05/20 09/05/21  Dellia Cloudoe, Benjamin, MD  benzonatate (TESSALON PERLES) 100 MG capsule Take 1 capsule (100 mg total) by mouth 3 (three) times daily  as needed for cough. 12/13/20   Steffanie RainwaterAmponsah, Prosper M, MD  carvedilol (COREG) 6.25 MG tablet Take 1 tablet (6.25 mg total) by mouth 2 (two) times daily with a meal. 01/18/21   Roylene ReasonJohnson, Matthew, MD  chlorthalidone (HYGROTON) 25 MG tablet Take 0.5 tablets (12.5 mg total) by mouth daily. 12/13/20 03/13/21  Steffanie RainwaterAmponsah, Prosper M, MD  eplerenone (INSPRA) 25 MG tablet Take 0.5 tablets (12.5 mg total) by mouth daily. 12/06/20   Dellia Cloudoe, Benjamin, MD  furosemide (LASIX) 20 MG tablet Take 1 tablet (20 mg total) by mouth daily. 12/06/20   Dellia Cloudoe, Benjamin, MD  guaiFENesin-codeine [facility](ROBITUSSIN AC) 100-10 MG/5ML syrup Take 5-10 mLs by mouth 3 (three) times daily as needed for cough. 12/16/19   Angelita InglesWinfrey, William B, MD  guaiFENesin-dextromethorphan (ROBITUSSIN DM) 100-10 MG/5ML syrup Take 5-10 mLs by mouth every 8 (eight) hours as needed for cough. 06/10/19   Angelita InglesWinfrey, William B, MD  loratadine (CLARITIN) 10 MG tablet Take 1 tablet (10 mg total) by mouth daily. 12/16/19 01/15/20  Angelita InglesWinfrey, William B, MD  omeprazole (PRILOSEC) 20 MG capsule Take 1 capsule (20 mg total) by mouth daily. 04/20/19   Angelita InglesWinfrey, William B, MD  rivaroxaban (XARELTO) 20 MG TABS tablet Take 1 tablet (20 mg total) by mouth daily with supper. 12/06/20   Dellia Cloudoe, Benjamin, MD    Allergies    Tramadol and Ciprofloxacin  Review of Systems   Review of Systems  Constitutional: Positive for chills, fatigue and fever.  HENT: Negative for congestion and rhinorrhea.   Eyes: Negative for redness and visual disturbance.  Respiratory: Positive for cough and shortness of breath. Negative for wheezing.   Cardiovascular: Negative for chest pain and palpitations.  Gastrointestinal: Negative for nausea and vomiting.  Genitourinary: Negative for dysuria and urgency.  Musculoskeletal: Positive for myalgias. Negative for arthralgias.  Skin: Negative for pallor and wound.  Neurological: Negative for dizziness and headaches.    Physical Exam Updated Vital Signs BP 123/73   Pulse 75    Temp 100 F (37.8 C) (Oral)   Resp 13   Ht 5\' 3"  (1.6 m)   Wt 109 kg   SpO2 96%   BMI 42.57 kg/m   Physical Exam Vitals and nursing note reviewed.  Constitutional:      General: She is not in acute distress.    Appearance: She is well-developed. She is not diaphoretic.  HENT:     Head: Normocephalic and atraumatic.  Eyes:     Pupils: Pupils are equal, round, and reactive to light.  Cardiovascular:     Rate and Rhythm: Normal rate and regular rhythm.     Heart sounds: No murmur heard. No friction rub. No gallop.   Pulmonary:     Effort: Pulmonary effort is normal.     Breath sounds: No wheezing, rhonchi or rales.  Abdominal:     General:  There is no distension.     Palpations: Abdomen is soft.     Tenderness: There is no abdominal tenderness.  Musculoskeletal:        General: No tenderness.     Cervical back: Normal range of motion and neck supple.  Skin:    General: Skin is warm and dry.  Neurological:     Mental Status: She is alert and oriented to person, place, and time.  Psychiatric:        Behavior: Behavior normal.     ED Results / Procedures / Treatments   Labs (all labs ordered are listed, but only abnormal results are displayed) Labs Reviewed  COMPREHENSIVE METABOLIC PANEL - Abnormal; Notable for the following components:      Result Value   Potassium 3.0 (*)    Glucose, Bld 112 (*)    Creatinine, Ser 1.03 (*)    All other components within normal limits  CBC WITH DIFFERENTIAL/PLATELET - Abnormal; Notable for the following components:   RBC 5.24 (*)    Monocytes Absolute 1.1 (*)    All other components within normal limits  RESP PANEL BY RT-PCR (FLU A&B, COVID) ARPGX2  BRAIN NATRIURETIC PEPTIDE  TROPONIN I (HIGH SENSITIVITY)  TROPONIN I (HIGH SENSITIVITY)    EKG None ED ECG REPORT   Date: 01/23/2021  Rate: 72  Rhythm: normal sinus rhythm  QRS Axis: normal  Intervals: normal  ST/T Wave abnormalities: nonspecific ST changes  Conduction  Disutrbances:none  Narrative Interpretation:   Old EKG Reviewed: changes noted  I have personally reviewed the EKG tracing and agree with the computerized printout as noted.  Radiology DG Chest 2 View  Result Date: 01/22/2021 CLINICAL DATA:  Shortness of breath and cough. EXAM: CHEST - 2 VIEW COMPARISON:  May 21, 2018. FINDINGS: Cardiomediastinal silhouette is within normal limits. Mild streaky bibasilar opacities. No visible pleural effusions or pneumothorax. No acute osseous abnormality. IMPRESSION: Mild streaky bibasilar opacities, which could represent atelectasis, aspiration, and/or pneumonia. Electronically Signed   By: Feliberto Harts MD   On: 01/22/2021 20:48    Procedures Procedures   Medications Ordered in ED Medications  doxycycline (VIBRA-TABS) tablet 100 mg (100 mg Oral Given 01/23/21 0348)    ED Course  I have reviewed the triage vital signs and the nursing notes.  Pertinent labs & imaging results that were available during my care of the patient were reviewed by me and considered in my medical decision making (see chart for details).    MDM Rules/Calculators/A&P                          65 yo F with a chief complaints of an upper respiratory illness.  Going on for a few days now.  She is well-appearing and nontoxic.  Clear lung sounds for me.  Chest x-ray read as possible pneumonia.  Will start on antibiotics.  COVID-negative.  5:19 AM:  I have discussed the diagnosis/risks/treatment options with the patient and believe the pt to be eligible for discharge home to follow-up with PCP. We also discussed returning to the ED immediately if new or worsening sx occur. We discussed the sx which are most concerning (e.g., sudden worsening pain, fever, inability to tolerate by mouth) that necessitate immediate return. Medications administered to the patient during their visit and any new prescriptions provided to the patient are listed below.  Medications given during this  visit Medications  doxycycline (VIBRA-TABS) tablet 100 mg (100 mg Oral  Given 01/23/21 0348)     The patient appears reasonably screen and/or stabilized for discharge and I doubt any other medical condition or other [facility] requiring further screening, evaluation, or treatment in the ED at this time prior to discharge.   Final Clinical Impression(s) / ED Diagnoses Final diagnoses:  Community acquired pneumonia of left lower lobe of lung    Rx / DC Orders ED Discharge Orders         Ordered    doxycycline (VIBRAMYCIN) 100 MG capsule  2 times daily        01/23/21 0331    chlorpheniramine-HYDROcodone (TUSSIONEX PENNKINETIC ER) 10-8 MG/5ML SUER  Every 12 hours PRN        01/23/21 0331    ondansetron (ZOFRAN ODT) 4 MG disintegrating tablet        01/23/21 0331           Melene Plan, DO 01/23/21 5361

## 2021-01-24 NOTE — Progress Notes (Signed)
Internal Medicine Clinic Attending  Case discussed with Dr. Marijo Conception  At the time of the visit.  We reviewed the resident's symptoms and  I agree with the assessment, diagnosis, and plan of care documented in the resident's note. ED visit is advised.

## 2021-01-26 ENCOUNTER — Encounter: Payer: Self-pay | Admitting: Student

## 2021-01-26 ENCOUNTER — Telehealth: Payer: Self-pay | Admitting: Student

## 2021-01-26 NOTE — Telephone Encounter (Signed)
Contacted patient by telephone. Patient reports nausea and one episode of vomiting with doxycycline but otherwise feeling as if she is improving from a respiratory standpoint. Patient discussed with Dr. Marijo Conception and received letter for work. Patient to pick up updated later on Monday.

## 2021-01-26 NOTE — Telephone Encounter (Signed)
Pt states she went to the Emergency room after her telehealth visit with Dr. Marijo Conception on 01/22/2021, and is needing an Extended Dr's note to be written for her job as she is still not feeling better.  Please call the patient back.

## 2021-02-08 ENCOUNTER — Other Ambulatory Visit (HOSPITAL_COMMUNITY): Payer: Self-pay

## 2021-02-15 ENCOUNTER — Other Ambulatory Visit (HOSPITAL_COMMUNITY): Payer: Self-pay

## 2021-02-19 ENCOUNTER — Other Ambulatory Visit: Payer: Self-pay | Admitting: Internal Medicine

## 2021-02-19 DIAGNOSIS — I10 Essential (primary) hypertension: Secondary | ICD-10-CM

## 2021-02-19 DIAGNOSIS — I5030 Unspecified diastolic (congestive) heart failure: Secondary | ICD-10-CM

## 2021-02-20 ENCOUNTER — Other Ambulatory Visit (HOSPITAL_COMMUNITY): Payer: Self-pay

## 2021-02-21 ENCOUNTER — Other Ambulatory Visit: Payer: Self-pay | Admitting: Internal Medicine

## 2021-02-21 ENCOUNTER — Other Ambulatory Visit (HOSPITAL_COMMUNITY): Payer: Self-pay

## 2021-02-21 ENCOUNTER — Other Ambulatory Visit: Payer: Self-pay | Admitting: Student

## 2021-02-21 DIAGNOSIS — I5030 Unspecified diastolic (congestive) heart failure: Secondary | ICD-10-CM

## 2021-02-21 DIAGNOSIS — I10 Essential (primary) hypertension: Secondary | ICD-10-CM

## 2021-02-21 MED ORDER — AMLODIPINE BESYLATE 5 MG PO TABS
5.0000 mg | ORAL_TABLET | Freq: Every day | ORAL | 2 refills | Status: DC
Start: 2021-02-21 — End: 2021-12-05
  Filled 2021-02-21: qty 30, 30d supply, fill #0
  Filled 2021-03-30: qty 30, 30d supply, fill #1
  Filled 2021-04-27: qty 30, 30d supply, fill #2
  Filled 2021-05-30: qty 30, 30d supply, fill #3
  Filled 2021-07-02: qty 30, 30d supply, fill #4
  Filled 2021-08-01: qty 30, 30d supply, fill #5
  Filled 2021-09-03: qty 30, 30d supply, fill #6
  Filled 2021-10-01: qty 30, 30d supply, fill #7
  Filled 2021-10-31: qty 30, 30d supply, fill #8

## 2021-02-21 MED ORDER — AMLODIPINE BESYLATE 5 MG PO TABS
ORAL_TABLET | Freq: Every day | ORAL | 2 refills | Status: DC
  Filled 2021-02-21: qty 30, fill #0

## 2021-03-09 ENCOUNTER — Other Ambulatory Visit: Payer: Self-pay | Admitting: Internal Medicine

## 2021-03-09 ENCOUNTER — Other Ambulatory Visit (HOSPITAL_COMMUNITY): Payer: Self-pay

## 2021-03-09 DIAGNOSIS — I5030 Unspecified diastolic (congestive) heart failure: Secondary | ICD-10-CM

## 2021-03-09 MED ORDER — FUROSEMIDE 20 MG PO TABS
20.0000 mg | ORAL_TABLET | Freq: Every day | ORAL | 2 refills | Status: DC
  Filled 2021-03-09: qty 30, 30d supply, fill #0
  Filled 2021-04-11: qty 30, 30d supply, fill #1
  Filled 2021-05-11: qty 30, 30d supply, fill #2

## 2021-03-21 ENCOUNTER — Other Ambulatory Visit (HOSPITAL_COMMUNITY): Payer: Self-pay

## 2021-03-21 ENCOUNTER — Other Ambulatory Visit: Payer: Self-pay | Admitting: Internal Medicine

## 2021-03-21 ENCOUNTER — Other Ambulatory Visit: Payer: Self-pay | Admitting: Student

## 2021-03-21 DIAGNOSIS — Z86711 Personal history of pulmonary embolism: Secondary | ICD-10-CM

## 2021-03-21 MED ORDER — CHLORTHALIDONE 25 MG PO TABS
12.5000 mg | ORAL_TABLET | Freq: Every day | ORAL | 2 refills | Status: DC
  Filled 2021-03-21: qty 15, 30d supply, fill #0
  Filled 2021-04-23: qty 15, 30d supply, fill #1
  Filled 2021-05-24: qty 15, 30d supply, fill #2

## 2021-03-22 ENCOUNTER — Other Ambulatory Visit (HOSPITAL_COMMUNITY): Payer: Self-pay

## 2021-03-22 MED ORDER — RIVAROXABAN 20 MG PO TABS
20.0000 mg | ORAL_TABLET | Freq: Every day | ORAL | 11 refills | Status: DC
  Filled 2021-03-22: qty 30, 30d supply, fill #0
  Filled 2021-04-23: qty 30, 30d supply, fill #1
  Filled 2021-05-24: qty 30, 30d supply, fill #2
  Filled 2021-06-20: qty 30, 30d supply, fill #3
  Filled 2021-07-24: qty 30, 30d supply, fill #4
  Filled 2021-08-27: qty 30, 30d supply, fill #5
  Filled 2021-09-26: qty 30, 30d supply, fill #6

## 2021-03-27 ENCOUNTER — Other Ambulatory Visit (HOSPITAL_COMMUNITY): Payer: Self-pay

## 2021-03-30 ENCOUNTER — Other Ambulatory Visit (HOSPITAL_COMMUNITY): Payer: Self-pay

## 2021-04-11 ENCOUNTER — Other Ambulatory Visit (HOSPITAL_COMMUNITY): Payer: Self-pay

## 2021-04-23 ENCOUNTER — Other Ambulatory Visit: Payer: Self-pay

## 2021-04-24 ENCOUNTER — Other Ambulatory Visit: Payer: Self-pay | Admitting: Student

## 2021-04-24 ENCOUNTER — Other Ambulatory Visit (HOSPITAL_COMMUNITY): Payer: Self-pay

## 2021-04-24 DIAGNOSIS — I10 Essential (primary) hypertension: Secondary | ICD-10-CM

## 2021-04-25 ENCOUNTER — Other Ambulatory Visit: Payer: Self-pay | Admitting: Student

## 2021-04-25 ENCOUNTER — Other Ambulatory Visit (HOSPITAL_COMMUNITY): Payer: Self-pay

## 2021-04-25 DIAGNOSIS — I10 Essential (primary) hypertension: Secondary | ICD-10-CM

## 2021-04-26 ENCOUNTER — Other Ambulatory Visit: Payer: Self-pay | Admitting: Student

## 2021-04-26 ENCOUNTER — Other Ambulatory Visit (HOSPITAL_COMMUNITY): Payer: Self-pay

## 2021-04-26 DIAGNOSIS — I10 Essential (primary) hypertension: Secondary | ICD-10-CM

## 2021-04-27 ENCOUNTER — Other Ambulatory Visit (HOSPITAL_COMMUNITY): Payer: Self-pay

## 2021-04-27 ENCOUNTER — Other Ambulatory Visit: Payer: Self-pay

## 2021-05-01 ENCOUNTER — Other Ambulatory Visit (HOSPITAL_COMMUNITY): Payer: Self-pay

## 2021-05-01 ENCOUNTER — Other Ambulatory Visit: Payer: Self-pay | Admitting: Student

## 2021-05-01 DIAGNOSIS — I10 Essential (primary) hypertension: Secondary | ICD-10-CM

## 2021-05-02 ENCOUNTER — Other Ambulatory Visit (HOSPITAL_COMMUNITY): Payer: Self-pay

## 2021-05-02 ENCOUNTER — Other Ambulatory Visit: Payer: Self-pay | Admitting: Student

## 2021-05-02 DIAGNOSIS — I10 Essential (primary) hypertension: Secondary | ICD-10-CM

## 2021-05-02 MED ORDER — CARVEDILOL 6.25 MG PO TABS
6.2500 mg | ORAL_TABLET | Freq: Two times a day (BID) | ORAL | 2 refills | Status: DC
  Filled 2021-05-02: qty 60, 30d supply, fill #0
  Filled 2021-06-07: qty 60, 30d supply, fill #1
  Filled 2021-07-09: qty 60, 30d supply, fill #2

## 2021-05-02 MED ORDER — ATORVASTATIN CALCIUM 40 MG PO TABS
40.0000 mg | ORAL_TABLET | Freq: Every day | ORAL | 2 refills | Status: DC
  Filled 2021-05-02: qty 30, 30d supply, fill #0
  Filled 2021-06-20: qty 30, 30d supply, fill #1
  Filled 2021-07-24: qty 30, 30d supply, fill #2

## 2021-05-11 ENCOUNTER — Other Ambulatory Visit (HOSPITAL_COMMUNITY): Payer: Self-pay

## 2021-05-24 ENCOUNTER — Other Ambulatory Visit (HOSPITAL_COMMUNITY): Payer: Self-pay

## 2021-05-30 ENCOUNTER — Other Ambulatory Visit (HOSPITAL_COMMUNITY): Payer: Self-pay

## 2021-06-07 ENCOUNTER — Other Ambulatory Visit: Payer: Self-pay | Admitting: Student

## 2021-06-07 ENCOUNTER — Other Ambulatory Visit (HOSPITAL_COMMUNITY): Payer: Self-pay

## 2021-06-07 DIAGNOSIS — I5030 Unspecified diastolic (congestive) heart failure: Secondary | ICD-10-CM

## 2021-06-07 MED ORDER — FUROSEMIDE 20 MG PO TABS
20.0000 mg | ORAL_TABLET | Freq: Every day | ORAL | 0 refills | Status: DC
  Filled 2021-06-07: qty 30, 30d supply, fill #0

## 2021-06-20 ENCOUNTER — Other Ambulatory Visit: Payer: Self-pay | Admitting: Student

## 2021-06-20 ENCOUNTER — Other Ambulatory Visit (HOSPITAL_COMMUNITY): Payer: Self-pay

## 2021-06-20 MED ORDER — CHLORTHALIDONE 25 MG PO TABS
12.5000 mg | ORAL_TABLET | Freq: Every day | ORAL | 2 refills | Status: DC
  Filled 2021-06-20: qty 15, 30d supply, fill #0
  Filled 2021-07-20: qty 15, 30d supply, fill #1
  Filled 2021-08-27: qty 15, 30d supply, fill #2

## 2021-07-02 ENCOUNTER — Other Ambulatory Visit (HOSPITAL_COMMUNITY): Payer: Self-pay

## 2021-07-03 ENCOUNTER — Other Ambulatory Visit: Payer: Self-pay

## 2021-07-03 ENCOUNTER — Ambulatory Visit (INDEPENDENT_AMBULATORY_CARE_PROVIDER_SITE_OTHER): Payer: Self-pay | Admitting: Internal Medicine

## 2021-07-03 VITALS — BP 110/55 | HR 70 | Temp 98.0°F | Wt 233.6 lb

## 2021-07-03 DIAGNOSIS — R059 Cough, unspecified: Secondary | ICD-10-CM

## 2021-07-03 DIAGNOSIS — I10 Essential (primary) hypertension: Secondary | ICD-10-CM

## 2021-07-03 DIAGNOSIS — R053 Chronic cough: Secondary | ICD-10-CM

## 2021-07-03 NOTE — Progress Notes (Signed)
Subjective:  CC: worsening of chronic cough  HPI:  [name] is a 65 y.o. female with a past medical history stated below and presents today for worsening of chronic cough over the last 24 hours. Please see problem based assessment and plan for additional details.  Past Medical History:  Diagnosis Date   Acute heart failure (HCC) 05/21/2018   Bronchitis due to COVID-19 virus 03/23/2019   DVT (deep venous thrombosis) (HCC)    Hemorrhage of rectum and anus 05/06/2017   Hyperlipidemia    Hypertension    Leg pain    right   PE (pulmonary embolism)    Sleep apnea     Current Outpatient Medications on File Prior to Visit  Medication Sig Dispense Refill   amLODipine (NORVASC) 5 MG tablet Take 1 tablet (5 mg total) by mouth daily. 90 tablet 2   atorvastatin (LIPITOR) 40 MG tablet Take 1 tablet (40 mg total) by mouth daily. 30 tablet 2   benzonatate (TESSALON PERLES) 100 MG capsule Take 1 capsule (100 mg total) by mouth 3 (three) times daily as needed for cough. 30 capsule 0   carvedilol (COREG) 6.25 MG tablet Take 1 tablet (6.25 mg total) by mouth 2 (two) times daily with a meal. 60 tablet 2   chlorpheniramine-HYDROcodone (TUSSIONEX PENNKINETIC ER) 10-8 MG/5ML SUER Take 5 mLs by mouth every 12 (twelve) hours as needed for cough. 50 mL 0   chlorthalidone (HYGROTON) 25 MG tablet Take 0.5 tablets (12.5 mg total) by mouth daily. 15 tablet 2   eplerenone (INSPRA) 25 MG tablet Take 0.5 tablets (12.5 mg total) by mouth daily. 15 tablet 2   furosemide (LASIX) 20 MG tablet Take 1 tablet (20 mg total) by mouth daily. 30 tablet 0   guaiFENesin-codeine (ROBITUSSIN AC) 100-10 MG/5ML syrup Take 5-10 mLs by mouth 3 (three) times daily as needed for cough. 236 mL 0   guaiFENesin-dextromethorphan (ROBITUSSIN DM) 100-10 MG/5ML syrup Take 5-10 mLs by mouth every 8 (eight) hours as needed for cough. 236 mL 1   loratadine (CLARITIN) 10 MG tablet Take 1 tablet (10 mg total) by mouth daily. 30 tablet  0   omeprazole (PRILOSEC) 20 MG capsule Take 1 capsule (20 mg total) by mouth daily. 30 capsule 0   ondansetron (ZOFRAN ODT) 4 MG disintegrating tablet Take 1 tablet (4 mg total) by mouth every 4 (four) hours as needed for nausea and vomiting. 20 tablet 0   rivaroxaban (XARELTO) 20 MG TABS tablet Take 1 tablet (20 mg total) by mouth daily with supper. 30 tablet 11   No current facility-administered medications on file prior to visit.    Family History  Problem Relation Age of Onset   Hypertension Other    Heart disease Other    Diabetes Other    Coronary artery disease Other     Social History   Socioeconomic History   Marital status: Legally Separated    Spouse name: Not on file   Number of children: Not on file   Years of education: Not on file   Highest education level: Not on file  Occupational History   Not on file  Tobacco Use   Smoking status: Never   Smokeless tobacco: Never  Vaping Use   Vaping Use: Never used  Substance and Sexual Activity   Alcohol use: No   Drug use: Not on file   Sexual activity: Not Currently  Other Topics Concern   Not on file  Social History Narrative  Not on file   Social Determinants of Health   Financial Resource Strain: Not on file  Food Insecurity: Not on file  Transportation Needs: Not on file  Physical Activity: Not on file  Stress: Not on file  Social Connections: Not on file  Intimate Partner Violence: Not on file    Review of Systems: ROS negative except for what is noted on the assessment and plan.  Objective:   Vitals:   07/03/21 1518  BP: (!) 110/55  Pulse: 70  Temp: 98 F (36.7 C)  TempSrc: Oral  SpO2: 98%  Weight: 233 lb 9.6 oz (106 kg)    Physical Exam: Gen: A&O x3 and in no apparent distress, well appearing and nourished. HEENT:    Head - normocephalic, atraumatic.    Eye - visual acuity grossly intact, conjunctiva clear, sclera non-icteric, EOM intact.    Mouth - No obvious caries or  periodontal disease. Neck: no masses or nodules, AROM intact. CV: RRR, no murmurs, S1/S2 presents  Resp: Clear to auscultation bilaterally, normal pulmonary effort Abd: BS (+) x4, soft, non-tender abdomen, without hepatosplenomegaly or masses MSK: Grossly normal AROM and strength x4 extremities. Skin: good skin turgor, no rashes, unusual bruising, or prominent lesions.  Neuro: No focal deficits, grossly normal sensation and coordination.  Psych: Oriented x3 and responding appropriately. Intact memory, normal mood, judgement, affect, and insight.    Assessment & Plan:  See Encounters Tab for problem based charting.  Patient seen with Dr. Laney Pastor Marquel Spoto, D.O. [facility]  PGY-1 Pager: [phone]  Phone: [phone] Date 07/03/2021  Time 4:23 PM

## 2021-07-03 NOTE — Assessment & Plan Note (Addendum)
Patient states that her chronic cough has worsened over the last 24 hours.  Her cough has become more frequent, but remains nonproductive.  She denies fever, but states that she had chills last night and throughout today.  She has been able to eat and drink as normal, no loss of appetite.  No one around her has been sick.  Patient reports that she has had a dry cough since COVID infection 1 to 2 years ago.  She has previously tried albuterol inhalers, multiple cough drops and cough syrups with no relief.  She states that she has shortness of breath with ambulating distances over 100.  No prior history of pulmonary function test, and she was given paperwork to apply for orange card at this visit. SpO2 98%, Temp 98 F, on physical exam lungs are clear to auscultation bilaterally, normal pulmonary effort. Plan: - Testing for COVID, RSV, and influenza -- Follow-up in 6 weeks to assess for improvement in cough - Patient will need PFT in the future when she is able to get her on treatment.

## 2021-07-03 NOTE — Assessment & Plan Note (Signed)
>>  ASSESSMENT AND PLAN FOR CHRONIC COUGH WRITTEN ON 07/03/2021  4:23 PM BY MASTERS, KATIE, DO  Patient states that her chronic cough has worsened over the last 24 hours.  Her cough has become more frequent, but remains nonproductive.  She denies fever, but states that she had chills last night and throughout today.  She has been able to eat and drink as normal, no loss of appetite.  No one around her has been sick.  Patient reports that she has had a dry cough since COVID infection 1 to 2 years ago.  She has previously tried albuterol inhalers, multiple cough drops and cough syrups with no relief.  She states that she has shortness of breath with ambulating distances over 100.  No prior history of pulmonary function test, and she was given paperwork to apply for orange card at this visit. SpO2 98%, Temp 98 F, on physical exam lungs are clear to auscultation bilaterally, normal pulmonary effort. Plan: - Testing for COVID, RSV, and influenza -- Follow-up in 6 weeks to assess for improvement in cough - Patient will need PFT in the future when she is able to get her on treatment.

## 2021-07-03 NOTE — Assessment & Plan Note (Addendum)
Blood pressure at 110/55 today. She states that this is usually around were her blood pressure is when she checks it at home. She reports taking her blood pressure medications as prescribed.   Medications: Coreg 6.25 BID, amlodipine 5 mg qd, Eplerenone 12.5 mg qd, Lasix 20 mg qd, Chlorthalidone 12.5 mg qd  A/P: Continue Coreg 6.25 BID Continue Amlodipine 5 mg qd Continue Eplerenone 12.5 mg qd Continue Lasix 20 mg qd Continue Chlorthalidone 12.5 mg qd Follow-up in 6 weeks to recheck BP Her blood pressure with MAP of 73, may consider stopping medication if continues to be lower.

## 2021-07-03 NOTE — Patient Instructions (Signed)
[name], it was a pleasure seeing you today!  Today we discussed: Worsening cough You were tested for Covid, flu, and RSV today.  I will call you with your results tomorrow.  Please try hot teas, soups, and other supportive therapies.  We discussed different cough medications, but it sounds like no have been effective for you in the past.  If you develop shortness of breath, cough, have difficulty tolerating liquids, please give [place] a call or go to the ED.    I have ordered the following labs today:   Lab Orders         COVID-19, Flu A+B and RSV      Tests ordered today:  Covid, RSV, and Flu  Referrals ordered today:   Referral Orders  No referral(s) requested today     I have ordered the following medication/changed the following medications:   Stop the following medications: There are no discontinued medications.   Start the following medications: No orders of the defined types were placed in this encounter.    Follow-up:  6 weeks    Please make sure to arrive 15 minutes prior to your next appointment. If you arrive late, you may be asked to reschedule.   We look forward to seeing you next time. Please call our clinic at [phone] if you have any questions or concerns. The best time to call is Monday-Friday from 9am-4pm, but there is someone available 24/7. If after hours or the weekend, call the main hospital number and ask for the Internal Medicine Resident On-Call. If you need medication refills, please notify your pharmacy one week in advance and they will send [place] a request.  Thank you for letting us take part in your care. I hope you start to feel better soon!  Thank you, Dr. [facility]

## 2021-07-04 LAB — COVID-19, FLU A+B AND RSV
Influenza A, NAA: NOT DETECTED
Influenza B, NAA: NOT DETECTED
RSV, NAA: NOT DETECTED
SARS-CoV-2, NAA: NOT DETECTED

## 2021-07-05 ENCOUNTER — Telehealth: Payer: Self-pay | Admitting: Internal Medicine

## 2021-07-05 NOTE — Telephone Encounter (Signed)
Called to try to update patient on results of test. Unable to reach at that time.  Will try again later.

## 2021-07-06 ENCOUNTER — Telehealth: Payer: Self-pay | Admitting: Internal Medicine

## 2021-07-06 NOTE — Progress Notes (Signed)
Internal Medicine Clinic Attending  I saw and evaluated the patient.  I personally confirmed the key portions of the history and exam documented by Dr. Masters and I reviewed pertinent patient test results.  The assessment, diagnosis, and plan were formulated together and I agree with the documentation in the resident's note.  

## 2021-07-09 ENCOUNTER — Other Ambulatory Visit: Payer: Self-pay | Admitting: Internal Medicine

## 2021-07-09 ENCOUNTER — Other Ambulatory Visit (HOSPITAL_COMMUNITY): Payer: Self-pay

## 2021-07-09 DIAGNOSIS — I5030 Unspecified diastolic (congestive) heart failure: Secondary | ICD-10-CM

## 2021-07-10 ENCOUNTER — Other Ambulatory Visit (HOSPITAL_COMMUNITY): Payer: Self-pay

## 2021-07-10 MED ORDER — FUROSEMIDE 20 MG PO TABS
20.0000 mg | ORAL_TABLET | Freq: Every day | ORAL | 2 refills | Status: DC
  Filled 2021-07-10: qty 90, 90d supply, fill #0

## 2021-07-11 ENCOUNTER — Other Ambulatory Visit (HOSPITAL_COMMUNITY): Payer: Self-pay

## 2021-07-11 NOTE — Telephone Encounter (Signed)
Unable to reach patient, left voicemail regarding respiratory panel results that were negative.

## 2021-07-20 ENCOUNTER — Other Ambulatory Visit (HOSPITAL_COMMUNITY): Payer: Self-pay

## 2021-07-24 ENCOUNTER — Other Ambulatory Visit (HOSPITAL_COMMUNITY): Payer: Self-pay

## 2021-08-01 ENCOUNTER — Other Ambulatory Visit (HOSPITAL_COMMUNITY): Payer: Self-pay

## 2021-08-07 ENCOUNTER — Other Ambulatory Visit (HOSPITAL_COMMUNITY): Payer: Self-pay

## 2021-08-07 ENCOUNTER — Other Ambulatory Visit: Payer: Self-pay

## 2021-08-07 ENCOUNTER — Encounter: Payer: Self-pay | Admitting: Internal Medicine

## 2021-08-07 ENCOUNTER — Ambulatory Visit: Payer: Self-pay | Admitting: Internal Medicine

## 2021-08-07 VITALS — BP 129/64 | HR 77 | Temp 98.0°F | Ht 63.0 in | Wt 215.1 lb

## 2021-08-07 DIAGNOSIS — R053 Chronic cough: Secondary | ICD-10-CM

## 2021-08-07 DIAGNOSIS — I10 Essential (primary) hypertension: Secondary | ICD-10-CM

## 2021-08-07 DIAGNOSIS — K219 Gastro-esophageal reflux disease without esophagitis: Secondary | ICD-10-CM

## 2021-08-07 DIAGNOSIS — I5030 Unspecified diastolic (congestive) heart failure: Secondary | ICD-10-CM

## 2021-08-07 DIAGNOSIS — I110 Hypertensive heart disease with heart failure: Secondary | ICD-10-CM

## 2021-08-07 MED ORDER — FUROSEMIDE 20 MG PO TABS: 20.0000 mg | ORAL_TABLET | Freq: Every day | ORAL | 2 refills | Status: DC | PRN

## 2021-08-07 MED ORDER — OMEPRAZOLE 20 MG PO CPDR
40.0000 mg | DELAYED_RELEASE_CAPSULE | Freq: Every day | ORAL | 0 refills | Status: DC
  Filled 2021-08-07: qty 60, 30d supply, fill #0

## 2021-08-07 MED ORDER — OMEPRAZOLE 20 MG PO CPDR
40.0000 mg | DELAYED_RELEASE_CAPSULE | Freq: Two times a day (BID) | ORAL | 0 refills | Status: DC
  Filled 2021-08-07: qty 120, 30d supply, fill #0
  Filled 2021-08-07: qty 168, 42d supply, fill #0

## 2021-08-07 NOTE — Patient Instructions (Addendum)
[name], it was a pleasure seeing you today! You endorsed feeling well today. Below are some of the things we talked about this visit. We look forward to seeing you in the follow up appointment!  Today we discussed: You presented for a follow up for your chronic cough. You stated you are only taking OTC medicines for the cough and nothing that was prescribed has helped with your cough. We would like to try Prilosec 40 mg twice daily for 8 weeks to see if that helps with the cough.   Please bring in your paperwork for the orange card tomorrow.   Please continue taking your other medications with the following exception: Take the lasix as needed if you feel you have extra fluid which can be seen if you weigh your self daily. If you get swelling on your leg or have difficulty breathing when lying on one pillow at night, these are signs of too much fluid on your body and indication to take Lasix.    I have ordered the following labs today:  Lab Orders  No laboratory test(s) ordered today      Referrals ordered today:   Referral Orders  No referral(s) requested today     I have ordered the following medication/changed the following medications:   Stop the following medications: Medications Discontinued During This Encounter  Medication Reason   benzonatate (TESSALON PERLES) 100 MG capsule Completed Course   ondansetron (ZOFRAN ODT) 4 MG disintegrating tablet Completed Course   omeprazole (PRILOSEC) 20 MG capsule Completed Course   guaiFENesin-codeine (ROBITUSSIN AC) 100-10 MG/5ML syrup Completed Course   chlorpheniramine-HYDROcodone (TUSSIONEX PENNKINETIC ER) 10-8 MG/5ML SUER Completed Course   eplerenone (INSPRA) 25 MG tablet Patient has not taken in last 30 days   furosemide (LASIX) 20 MG tablet    omeprazole (PRILOSEC) 20 MG capsule      Start the following medications: Meds ordered this encounter  Medications   DISCONTD: omeprazole (PRILOSEC) 20 MG capsule     Sig: Take 2 capsules (40 mg total) by mouth daily.    Dispense:  84 capsule    Refill:  0    IM program   furosemide (LASIX) 20 MG tablet    Sig: Take 1 tablet (20 mg total) by mouth daily as needed.    Dispense:  90 tablet    Refill:  2    IM program   omeprazole (PRILOSEC) 20 MG capsule    Sig: Take 2 capsules (40 mg total) by mouth 2 (two) times daily before a meal.    Dispense:  168 capsule    Refill:  0    IM program     Follow-up: follow up in 8 weeks  Please make sure to arrive 15 minutes prior to your next appointment. If you arrive late, you may be asked to reschedule.   We look forward to seeing you next time. Please call our clinic at [phone] if you have any questions or concerns. The best time to call is Monday-Friday from 9am-4pm, but there is someone available 24/7. If after hours or the weekend, call the main hospital number and ask for the Internal Medicine Resident On-Call. If you need medication refills, please notify your pharmacy one week in advance and they will send [place] a request.  Thank you for letting us take part in your care. Wishing you the best!  Thank you, Gwenevere Abbot, MD

## 2021-08-07 NOTE — Progress Notes (Signed)
° °  CC: follow up  HPI:  [name] is a 65 y.o. with medical history as below presenting to [facility] for follow up.  Please see problem-based list for further details, assessments, and plans.  Past Medical History:  Diagnosis Date   Acute heart failure (HCC) 05/21/2018   Bronchitis due to COVID-19 virus 03/23/2019   DVT (deep venous thrombosis) (HCC)    Hemorrhage of rectum and anus 05/06/2017   Hyperlipidemia    Hypertension    Leg pain    right   PE (pulmonary embolism)    Sleep apnea    Review of Systems:  Review of system negative unless stated in the problem list or HPI.    Physical Exam:  Vitals:   08/07/21 1502  BP: 129/64  Pulse: 77  Temp: 98 F (36.7 C)  TempSrc: Oral  SpO2: 98%  Weight: 215 lb 1.6 oz (97.6 kg)  Height: 5\' 3"  (1.6 m)    Physical Exam General: Well-developed, NAD Head: Normocephalic without scalp lesions.  Eyes: PERRLA, Conjunctivae pink, sclerae white, without icterus.  Mouth and Throat: Lips normal color, without lesions. Moist mucus membrane. Neck: Neck supple with full range of motion (ROM).  Lungs: CTAB, no wheeze, rhonchi or rales.  Cardiovascular: Normal heart sounds, no r/m/g, 2+ pulses in all extremities. No LE edema Abdomen: No TTP, normal bowel sounds MSK: No asymmetry or muscle atrophy. Full range of motion (ROM) of all joints. No injuries noted. Strength 5/5 in all extremities.  Skin: warm, dry good skin turgor, no lesions noted Neuro: Alert and oriented. CN grossly intact Psych: Normal mood and normal affect   Assessment & Plan:   See Encounters Tab for problem based charting.  Patient seen with Dr. , MD

## 2021-08-09 NOTE — Assessment & Plan Note (Signed)
Assessment/Plan: Patient has a cough that have has been present since 2020. Patient related it to her COVID infection from that time. It is a dry cough occurring every hour. Does endorse throat irriation but denies fluid running behind the throat, states not currently having acid reflux. She states the cough is worse at night and sometimes she wakes up coughing. Gerd vs post-nasal drip vs Asthma. She is uninsured and has not filled out the paperwork for the orange card. She states she has not at home but forgot to bring it. Discussed with patient that night time coughing can be due to GERD or asthma. Since she is not endorsing any wheezing, I discussed empiric treatment for GERD. Patient was agreeable. -Omeprazole 40 mg BID for 8 weeks -Follow up in 2 months and if no resolution, will rule out other causes and refer to pulmonology

## 2021-08-09 NOTE — Progress Notes (Signed)
Internal Medicine Clinic Attending  I saw and evaluated the patient.  I personally confirmed the key portions of the history and exam documented by Dr. Khan and I reviewed pertinent patient test results.  The assessment, diagnosis, and plan were formulated together and I agree with the documentation in the resident's note.  

## 2021-08-09 NOTE — Assessment & Plan Note (Signed)
Assessment/Plan:  Patient has HTN and home regimen includes Coreg 6.25 mg BID, Norvasc 5 mg qd, Lasix 20 mg qd, and HCTZ 12.5 mg qd. Patient was prescribed the Eplerenone but states that was changed to HCTZ later. BP in the clinic is 129/64. Patient has a bp cuff at home but doesn't check her blood pressure at home. Her bp in the clinic last visit was well controlled. I advised her to take lasix as needed for volume overload as she was euvolemic on exam. -Continue Coreg, Norvasc, and HCTZ. Lasix as needed.  -Continue to monitor.

## 2021-08-09 NOTE — Assessment & Plan Note (Signed)
>>  ASSESSMENT AND PLAN FOR CHRONIC COUGH WRITTEN ON 08/09/2021  5:48 AM BY Idamae Schuller, MD  Assessment/Plan: Patient has a cough that have has been present since 2020. Patient related it to her COVID infection from that time. It is a dry cough occurring every hour. Does endorse throat irriation but denies fluid running behind the throat, states not currently having acid reflux. She states the cough is worse at night and sometimes she wakes up coughing. Gerd vs post-nasal drip vs Asthma. She is uninsured and has not filled out the paperwork for the orange card. She states she has not at home but forgot to bring it. Discussed with patient that night time coughing can be due to GERD or asthma. Since she is not endorsing any wheezing, I discussed empiric treatment for GERD. Patient was agreeable. -Omeprazole 40 mg BID for 8 weeks -Follow up in 2 months and if no resolution, will rule out other causes and refer to pulmonology

## 2021-08-09 NOTE — Assessment & Plan Note (Signed)
Patient has HFpEF. She is euvolemic on exam with no JVD, crackles, or LE edema. She is denying orthopnea. She is on Coreg 6.25 mg , Lasix 20 mg, and HCTZ 12.5 mg qd. -Advised patient to take lasix on as needed basis if she has orthopnea, LE edema or weight gain.

## 2021-08-13 ENCOUNTER — Other Ambulatory Visit: Payer: Self-pay | Admitting: Internal Medicine

## 2021-08-13 DIAGNOSIS — I10 Essential (primary) hypertension: Secondary | ICD-10-CM

## 2021-08-14 ENCOUNTER — Other Ambulatory Visit (HOSPITAL_COMMUNITY): Payer: Self-pay

## 2021-08-14 MED ORDER — CARVEDILOL 6.25 MG PO TABS
6.2500 mg | ORAL_TABLET | Freq: Two times a day (BID) | ORAL | 2 refills | Status: DC
  Filled 2021-08-14: qty 60, 30d supply, fill #0
  Filled 2021-09-21: qty 60, 30d supply, fill #1
  Filled 2021-10-22: qty 60, 30d supply, fill #2

## 2021-08-27 ENCOUNTER — Other Ambulatory Visit: Payer: Self-pay | Admitting: Internal Medicine

## 2021-08-27 ENCOUNTER — Other Ambulatory Visit (HOSPITAL_COMMUNITY): Payer: Self-pay

## 2021-08-27 DIAGNOSIS — I10 Essential (primary) hypertension: Secondary | ICD-10-CM

## 2021-08-28 ENCOUNTER — Other Ambulatory Visit (HOSPITAL_COMMUNITY): Payer: Self-pay

## 2021-08-28 MED ORDER — ATORVASTATIN CALCIUM 40 MG PO TABS
40.0000 mg | ORAL_TABLET | Freq: Every day | ORAL | 2 refills | Status: DC
  Filled 2021-08-28: qty 90, 90d supply, fill #0
  Filled 2021-12-11: qty 90, 90d supply, fill #1
  Filled 2022-04-12 (×2): qty 90, 90d supply, fill #2

## 2021-09-03 ENCOUNTER — Other Ambulatory Visit (HOSPITAL_COMMUNITY): Payer: Self-pay

## 2021-09-21 ENCOUNTER — Other Ambulatory Visit (HOSPITAL_COMMUNITY): Payer: Self-pay

## 2021-09-26 ENCOUNTER — Other Ambulatory Visit: Payer: Self-pay | Admitting: Student

## 2021-09-26 ENCOUNTER — Other Ambulatory Visit (HOSPITAL_COMMUNITY): Payer: Self-pay

## 2021-09-27 ENCOUNTER — Other Ambulatory Visit (HOSPITAL_COMMUNITY): Payer: Self-pay

## 2021-09-27 MED ORDER — CHLORTHALIDONE 25 MG PO TABS
12.5000 mg | ORAL_TABLET | Freq: Every day | ORAL | 2 refills | Status: DC
  Filled 2021-09-27: qty 45, 90d supply, fill #0
  Filled 2021-12-31: qty 45, 90d supply, fill #1
  Filled 2022-04-02: qty 45, 90d supply, fill #2

## 2021-10-01 ENCOUNTER — Other Ambulatory Visit (HOSPITAL_COMMUNITY): Payer: Self-pay

## 2021-10-11 ENCOUNTER — Other Ambulatory Visit (HOSPITAL_COMMUNITY): Payer: Self-pay

## 2021-10-22 ENCOUNTER — Other Ambulatory Visit (HOSPITAL_COMMUNITY): Payer: Self-pay

## 2021-10-24 ENCOUNTER — Other Ambulatory Visit (HOSPITAL_COMMUNITY): Payer: Self-pay

## 2021-10-24 ENCOUNTER — Ambulatory Visit: Payer: Self-pay | Admitting: Student

## 2021-10-24 VITALS — BP 123/69 | HR 66 | Temp 99.0°F | Wt 214.8 lb

## 2021-10-24 DIAGNOSIS — Z0000 Encounter for general adult medical examination without abnormal findings: Secondary | ICD-10-CM

## 2021-10-24 DIAGNOSIS — I824Z9 Acute embolism and thrombosis of unspecified deep veins of unspecified distal lower extremity: Secondary | ICD-10-CM

## 2021-10-24 DIAGNOSIS — Z86711 Personal history of pulmonary embolism: Secondary | ICD-10-CM

## 2021-10-24 DIAGNOSIS — R058 Other specified cough: Secondary | ICD-10-CM

## 2021-10-24 DIAGNOSIS — I5030 Unspecified diastolic (congestive) heart failure: Secondary | ICD-10-CM

## 2021-10-24 MED ORDER — RIVAROXABAN 20 MG PO TABS
20.0000 mg | ORAL_TABLET | Freq: Every day | ORAL | 11 refills | Status: DC
  Filled 2021-10-24: qty 30, 30d supply, fill #0

## 2021-10-24 MED ORDER — RIVAROXABAN 20 MG PO TABS
20.0000 mg | ORAL_TABLET | Freq: Every day | ORAL | 2 refills | Status: DC
  Filled 2021-10-24: qty 90, 90d supply, fill #0
  Filled 2022-01-24: qty 90, 90d supply, fill #1
  Filled 2022-05-06: qty 30, 30d supply, fill #2
  Filled 2022-06-04: qty 30, 30d supply, fill #3

## 2021-10-24 MED ORDER — FLUTICASONE PROPIONATE 50 MCG/ACT NA SUSP
1.0000 | Freq: Every day | NASAL | 2 refills | Status: AC
Start: 2021-10-24 — End: ?
  Filled 2021-10-24: qty 16, 30d supply, fill #0

## 2021-10-24 MED ORDER — FUROSEMIDE 20 MG PO TABS
20.0000 mg | ORAL_TABLET | Freq: Every day | ORAL | 2 refills | Status: DC | PRN
  Filled 2021-10-24: qty 90, 90d supply, fill #0

## 2021-10-24 MED ORDER — FUROSEMIDE 20 MG PO TABS: 20.0000 mg | ORAL_TABLET | Freq: Every day | ORAL | 2 refills | Status: DC | PRN

## 2021-10-24 NOTE — Patient Instructions (Signed)
[name], it was a pleasure seeing you today! ? 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 This can be caused by inflammation in your sinuses. I would like for you to irrigate your nose once daily in the morning. After this, use the steroid spray in each nostril once. Return to clinic in roughly one month to see how you are doing.  ? ?- I also would encourage you to get the orange card as soon as possible so we can complete your cancer screenings. In addition, you can get your tetanus shot at the pharmacy. ? ?Follow-up:  1 month   ? ?Please make sure to arrive 15 minutes prior to your next appointment. If you arrive late, you may be asked to reschedule.  ? ?We look forward to seeing you next time. Please call our clinic at [phone] if you have any questions or concerns. The best time to call is Monday-Friday from 9am-4pm, but there is someone available 24/7. If after hours or the weekend, call the main hospital number and ask for the Internal Medicine Resident On-Call. If you need medication refills, please notify your pharmacy one week in advance and they will send [place] a request. ? ?Thank you for letting us take part in your care. Wishing you the best! ? ?Thank you, ?Sanjuan Dame, MD ?

## 2021-10-25 DIAGNOSIS — R058 Other specified cough: Secondary | ICD-10-CM | POA: Insufficient documentation

## 2021-10-25 NOTE — Progress Notes (Signed)
? ?  CC: cough ? ?HPI: ? ?[name] is a 66 y.o. person with medical history as below presenting to [facility] for cough.  ? ?Please see problem-based list for further details, assessments, and plans. ? ?Past Medical History:  ?Diagnosis Date  ? Acute heart failure (HCC) 05/21/2018  ? Bronchitis due to COVID-19 virus 03/23/2019  ? DVT (deep venous thrombosis) (HCC)   ? Gastroenteritis due to food toxin 11/02/2020  ? Hemorrhage of rectum and anus 05/06/2017  ? Hyperlipidemia   ? Hypertension   ? Leg pain   ? right  ? PE (pulmonary embolism)   ? Sleep apnea   ? ?Review of Systems:  As per HPI ? ?Physical Exam: ? ?Vitals:  ? 10/24/21 0920  ?BP: 123/69  ?Pulse: 66  ?Temp: 99 ?F (37.2 ?C)  ?TempSrc: Oral  ?SpO2: 97%  ?Weight: 214 lb 12.8 oz (97.4 kg)  ? ?General: Resting comfortably in no acute distress ?HENT: Normocephalic, atraumatic. Bilateral inferior turbinates erythematous and swollen. Bilateral ear canals without cerumen or drainage. Bilateral eardrums in tact without bulging or erythema. Mucous membranes moist. No tonsillar exudates appreciated. Posterior pharyngeal wall erythematous, no lesions appreciated. No auricular, cervical or supraclavicular lymphadenopathy. ?Eyes: No conjunctival injection bilaterally. Vision grossly in tact. ?CV: Regular rate, rhythm. No murmurs appreciated. ?Pulm: Normal respiratory effort on room air. Clear to ausculation bilaterally. ?Neuro: Awake, alert, conversing appropriately. ?Psych: Normal mood, affect, speech. ? ?Assessment & Plan:  ? ?See Encounters Tab for problem based charting. ? ?Patient discussed with Dr. Oswaldo Done ? ?

## 2021-10-25 NOTE — Assessment & Plan Note (Addendum)
Patient is presenting today to discuss chronic cough. She states the cough started roughly two years ago and has not been relieved by any medication. Notes she did have COVID-19 prior to onset, although unsure if the cough started directly after her infection. Most recently [name] has been trialed on Claritin for allergic rhinitis and omeprazole for possible underlying GERD. She reports the cough is non-productive and especially bothersome at night when she lays down. She also mentions chronic post-nasal drip in addition to intermittent watery eyes. Patient denies any systemic symptoms, including fevers, chills, body aches, night sweats, weight changes. Also denies chest pain, dyspnea, nausea, vomiting, and abdominal pain.  ? ?[name]'s symptoms most consistent upper airway cough syndrome. Patient is not on any medication that would cause chronic cough and has failed trials of PPI and antihistamines. Discussed this with patient, including the plan for daily nasal irrigation and intranasal glucocorticoid. Also counseled patient on proper use of intranasal spray. Can consider prolonged postinfectious cough 2/2 COVID-19 if patient's symptoms do not improve with Flonase. This management would likely be supportive, including over the counter cough supressants and possibly inhaled bronchodilators. We will follow-up with [name] in one month to re-evaluate.  ? ?- Daily nasal irrigation followed by Flonase ?- Return to clinic in one month for re-evaluation ?

## 2021-10-25 NOTE — Assessment & Plan Note (Signed)
Patient currently does not have insurance therefore making it difficult to complete many of the healthcare maintenance tasks. She is currently due for a mammogram and PAP smear. Patient declined PAP smear today in office. [name] already has the application for an orange card, but has not completed this. Encouraged patient to finish the paperwork prior to her next visit. In the meantime, [name] was given information regarding [name] program for free mammograms and PAP smears. ?

## 2021-10-26 NOTE — Progress Notes (Signed)
Internal Medicine Clinic Attending ? ?Case discussed with Dr. Braswell  At the time of the visit.  We reviewed the resident?s history and exam and pertinent patient test results.  I agree with the assessment, diagnosis, and plan of care documented in the resident?s note.  ?

## 2021-10-31 ENCOUNTER — Other Ambulatory Visit (HOSPITAL_COMMUNITY): Payer: Self-pay

## 2021-11-12 ENCOUNTER — Other Ambulatory Visit: Payer: Self-pay

## 2021-11-12 DIAGNOSIS — I10 Essential (primary) hypertension: Secondary | ICD-10-CM

## 2021-11-12 MED ORDER — CARVEDILOL 6.25 MG PO TABS: 6.2500 mg | ORAL_TABLET | Freq: Two times a day (BID) | ORAL | 2 refills | Status: DC

## 2021-11-21 ENCOUNTER — Encounter: Payer: Self-pay | Admitting: Internal Medicine

## 2021-11-21 ENCOUNTER — Other Ambulatory Visit (HOSPITAL_COMMUNITY): Payer: Self-pay

## 2021-11-21 ENCOUNTER — Ambulatory Visit (INDEPENDENT_AMBULATORY_CARE_PROVIDER_SITE_OTHER): Payer: Self-pay | Admitting: Internal Medicine

## 2021-11-21 ENCOUNTER — Other Ambulatory Visit: Payer: Self-pay

## 2021-11-21 VITALS — BP 129/54 | HR 64 | Temp 98.0°F | Ht 63.0 in | Wt 213.7 lb

## 2021-11-21 DIAGNOSIS — R053 Chronic cough: Secondary | ICD-10-CM

## 2021-11-21 DIAGNOSIS — J301 Allergic rhinitis due to pollen: Secondary | ICD-10-CM

## 2021-11-21 MED ORDER — CETIRIZINE HCL 10 MG PO TABS
10.0000 mg | ORAL_TABLET | Freq: Every day | ORAL | 2 refills | Status: DC
  Filled 2021-11-21: qty 30, 30d supply, fill #0

## 2021-11-21 NOTE — Patient Instructions (Addendum)
Thank you, [name] for allowing [place] to provide your care today. Today we discussed cough.   ? ?Labs/Tests Ordered: ?Lab Orders  ?No laboratory test(s) ordered today  ?  ? ?Referrals Ordered:  ?Referral Orders  ?No referral(s) requested today  ?  ?Cell Cardiology at [facility] at [facility] [phone] ? ?Medication Changes:  ?Medications Discontinued During This Encounter  ?Medication Reason  ? loratadine (CLARITIN) 10 MG tablet   ?  ? ?Meds ordered this encounter  ?Medications  ? cetirizine (ZYRTEC ALLERGY) 10 MG tablet  ?  Sig: Take 1 tablet (10 mg total) by mouth daily.  ?  Dispense:  30 tablet  ?  Refill:  2  ?  ? ?Health Maintenance Screening: ?There are no preventive care reminders to display for this patient.  ? ?Instructions: If you symptoms do not improve ina month then we will need to get pulmonary function testing performed.  ? ?Follow up:  1 month for a telehealth visit   ? ?Remember: If you have any questions or concerns, call our clinic at [phone] or after hours call [phone] and ask for the internal medicine resident on call. ? ?Marianna Payment, D.O. ?[facility] ? ? ? ?

## 2021-11-21 NOTE — Progress Notes (Signed)
? ? ?Subjective:  ?CC: cough ? ?HPI: ? ?[name] is a 66 y.o. female with a past medical history stated below and presents today for cough. Please see problem based assessment and plan for additional details. ? ?Past Medical History:  ?Diagnosis Date  ? Acute heart failure (HCC) 05/21/2018  ? Bronchitis due to COVID-19 virus 03/23/2019  ? DVT (deep venous thrombosis) (HCC)   ? Gastroenteritis due to food toxin 11/02/2020  ? Hemorrhage of rectum and anus 05/06/2017  ? Hyperlipidemia   ? Hypertension   ? Leg pain   ? right  ? PE (pulmonary embolism)   ? Sleep apnea   ? ? ?Current Outpatient Medications on File Prior to Visit  ?Medication Sig Dispense Refill  ? amLODipine (NORVASC) 5 MG tablet Take 1 tablet (5 mg total) by mouth daily. 90 tablet 2  ? atorvastatin (LIPITOR) 40 MG tablet Take 1 tablet (40 mg total) by mouth daily. 90 tablet 2  ? carvedilol (COREG) 6.25 MG tablet Take 1 tablet (6.25 mg total) by mouth 2 (two) times daily with a meal. 180 tablet 2  ? chlorthalidone (HYGROTON) 25 MG tablet Take 0.5 tablets (12.5 mg total) by mouth daily. 45 tablet 2  ? fluticasone (FLONASE) 50 MCG/ACT nasal spray Place 1 spray into both nostrils daily. 16 g 2  ? furosemide (LASIX) 20 MG tablet Take 1 tablet (20 mg total) by mouth daily as needed. 90 tablet 2  ? guaiFENesin-dextromethorphan (ROBITUSSIN DM) 100-10 MG/5ML syrup Take 5-10 mLs by mouth every 8 (eight) hours as needed for cough. 236 mL 1  ? omeprazole (PRILOSEC) 20 MG capsule Take 2 capsules (40 mg total) by mouth 2 (two) times daily before a meal. 168 capsule 0  ? rivaroxaban (XARELTO) 20 MG TABS tablet Take 1 tablet (20 mg total) by mouth daily with supper. 90 tablet 2  ? ?No current facility-administered medications on file prior to visit.  ? ? ?Family History  ?Problem Relation Age of Onset  ? Hypertension Other   ? Heart disease Other   ? Diabetes Other   ? Coronary artery disease Other   ? ? ?Social History  ? ?Socioeconomic History  ? Marital  status: Legally Separated  ?  Spouse name: Not on file  ? Number of children: Not on file  ? Years of education: Not on file  ? Highest education level: Not on file  ?Occupational History  ? Not on file  ?Tobacco Use  ? Smoking status: Never  ? Smokeless tobacco: Never  ?Vaping Use  ? Vaping Use: Never used  ?Substance and Sexual Activity  ? Alcohol use: No  ? Drug use: Not on file  ? Sexual activity: Not Currently  ?Other Topics Concern  ? Not on file  ?Social History Narrative  ? Not on file  ? ?Social Determinants of Health  ? ?Financial Resource Strain: Not on file  ?Food Insecurity: Not on file  ?Transportation Needs: Not on file  ?Physical Activity: Not on file  ?Stress: Not on file  ?Social Connections: Not on file  ?Intimate Partner Violence: Not on file  ? ? ?Review of Systems: ?ROS negative except for what is noted on the assessment and plan. ? ?Objective:  ? ?Vitals:  ? 11/21/21 0951  ?BP: (!) 129/54  ?Pulse: 64  ?Temp: 98 ?F (36.7 ?C)  ?TempSrc: Oral  ?SpO2: 97%  ?Weight: 213 lb 11.2 oz (96.9 kg)  ?Height: 5\' 3"  (1.6 m)  ? ? ?Physical Exam: ?Gen:  A&O x3 and in no apparent distress, well appearing and nourished. ?HEENT:  ?  Head - normocephalic, atraumatic.  ?  Eye - visual acuity grossly intact, conjunctiva clear, sclera non-icteric, EOM intact.  ?  Mouth - No obvious caries or periodontal disease. ?  Nose - edematous nasal mucosa with some erythema and mucous  ?Neck: no masses or nodules, AROM intact. ?CV: RRR, no murmurs, S1/S2 presents  ?Resp: Clear to ascultation bilaterally  ?Psych: Oriented x3 and responding appropriately. Intact memory, normal mood, judgement, affect, and insight.  ? ? ?Assessment & Plan:  ?See Encounters Tab for problem based charting. ? ?Patient discussed with Dr. Mayford Knife ? ? ?Dellia Cloud, D.O. ?[facility] Internal Medicine  PGY-[id number] ?Pager: [phone]  Phone: [phone] ?Date 11/22/2021  Time 2:02 PM ? ?

## 2021-11-22 ENCOUNTER — Other Ambulatory Visit (HOSPITAL_COMMUNITY): Payer: Self-pay

## 2021-11-22 ENCOUNTER — Other Ambulatory Visit: Payer: Self-pay | Admitting: Internal Medicine

## 2021-11-22 ENCOUNTER — Encounter: Payer: Self-pay | Admitting: Internal Medicine

## 2021-11-22 DIAGNOSIS — I10 Essential (primary) hypertension: Secondary | ICD-10-CM

## 2021-11-22 NOTE — Assessment & Plan Note (Signed)
>>  ASSESSMENT AND PLAN FOR CHRONIC COUGH WRITTEN ON 11/22/2021  2:01 PM BY COE, BENJAMIN, MD  HPI: Patient presents for further evaluation and management of chronic cough. Patient was previously in at the [facility] roughly a month ago. At that time she was treated for allergic sinusitis with post-nasal drip. She was treated with intranasal steroid and irrigation. She states that here symptoms have significantly improved but continues to have a nonproductive cough. The cough has been present since 2020. She states it started after she had COVID. She states that her cough is worse at night but also present in the day. She denies any additional symptoms such as shortness of breath, wheezing, or exercise intolerance.    On exam there was evidence of allergic rhinitis with edematous nasal mucosa and mucus. Lungs were clear bilaterally.   Assessment/Plan: Chronic Cough - Will switch patient to zyrtec to see if her symptoms improve further on a different antihistamine. - If symptoms persist than she will need PFTs

## 2021-11-22 NOTE — Assessment & Plan Note (Addendum)
HPI: ?Patient presents for further evaluation and management of chronic cough. Patient was previously in at the [facility] roughly a month ago. At that time she was treated for allergic sinusitis with post-nasal drip. She was treated with intranasal steroid and irrigation. She states that here symptoms have significantly improved but continues to have a nonproductive cough. The cough has been present since 2020. She states it started after she had COVID. She states that her cough is worse at night but also present in the day. She denies any additional symptoms such as shortness of breath, wheezing, or exercise intolerance.   ? ?On exam there was evidence of allergic rhinitis with edematous nasal mucosa and mucus. Lungs were clear bilaterally.  ? ?Assessment/Plan: ?Chronic Cough ?- Will switch patient to zyrtec to see if her symptoms improve further on a different antihistamine. ?- If symptoms persist than she will need PFTs  ? ?

## 2021-11-23 ENCOUNTER — Other Ambulatory Visit (HOSPITAL_COMMUNITY): Payer: Self-pay

## 2021-11-23 MED ORDER — CARVEDILOL 6.25 MG PO TABS
6.2500 mg | ORAL_TABLET | Freq: Two times a day (BID) | ORAL | 2 refills | Status: DC
  Filled 2021-11-23: qty 60, 30d supply, fill #0
  Filled 2021-12-24: qty 60, 30d supply, fill #1
  Filled 2022-01-24: qty 60, 30d supply, fill #2

## 2021-11-27 NOTE — Progress Notes (Signed)
Internal Medicine Clinic Attending  Case discussed with Dr. Coe  At the time of the visit.  We reviewed the resident's history and exam and pertinent patient test results.  I agree with the assessment, diagnosis, and plan of care documented in the resident's note.  

## 2021-12-05 ENCOUNTER — Other Ambulatory Visit (HOSPITAL_COMMUNITY): Payer: Self-pay

## 2021-12-05 ENCOUNTER — Other Ambulatory Visit: Payer: Self-pay | Admitting: Student

## 2021-12-05 DIAGNOSIS — I10 Essential (primary) hypertension: Secondary | ICD-10-CM

## 2021-12-05 MED ORDER — AMLODIPINE BESYLATE 5 MG PO TABS
5.0000 mg | ORAL_TABLET | Freq: Every day | ORAL | 2 refills | Status: DC
  Filled 2021-12-05: qty 90, 90d supply, fill #0
  Filled 2022-03-14: qty 90, 90d supply, fill #1
  Filled 2022-06-14: qty 90, 90d supply, fill #2

## 2021-12-11 ENCOUNTER — Other Ambulatory Visit (HOSPITAL_COMMUNITY): Payer: Self-pay

## 2021-12-19 ENCOUNTER — Other Ambulatory Visit (HOSPITAL_COMMUNITY): Payer: Self-pay

## 2021-12-19 ENCOUNTER — Encounter: Payer: Self-pay | Admitting: Internal Medicine

## 2021-12-19 ENCOUNTER — Ambulatory Visit (INDEPENDENT_AMBULATORY_CARE_PROVIDER_SITE_OTHER): Payer: Self-pay | Admitting: Internal Medicine

## 2021-12-19 DIAGNOSIS — R058 Other specified cough: Secondary | ICD-10-CM

## 2021-12-19 DIAGNOSIS — J301 Allergic rhinitis due to pollen: Secondary | ICD-10-CM

## 2021-12-19 MED ORDER — CETIRIZINE HCL 10 MG PO TABS
10.0000 mg | ORAL_TABLET | Freq: Every day | ORAL | 2 refills | Status: DC
  Filled 2021-12-19 – 2021-12-21 (×2): qty 90, 90d supply, fill #0

## 2021-12-19 NOTE — Assessment & Plan Note (Addendum)
Patient presents for 1 month follow-up for persistent cough.  At last appointment patient was seen for persistent cough following worsening allergic sinusitis with postnasal drip diagnosed a month prior.  2 months ago she had been treated with intranasal steroid and irrigation and this has resolved her symptoms though she had a persistent nonproductive cough.  Patient reports she has had a chronic cough since 2020 which started after she had COVID.  At last appointment patient was noted to have allergic rhinitis with edematous nasal mucosa and mucus and her provider recommended switching from daily Claritin to daily Zyrtec to see if a different antihistamine would improve her symptoms. ? ?Today patient reports an improvement in her cough since taking the Zyrtec which she has been taking daily.  Requires Zyrtec refill today.  She reports she is still having an every day cough though this is not interfering with her everyday activities.  Her Flonase is working well for her sinuses.  She does have a history of HFpEF and endorses shortness of breath only following long walks for example when walking to her car in the parking lot after work.  She has not needed to take any Lasix for swelling within the last month. Patient denies history of asthma or COPD in the past, was prescribed an inhaler once without perceived benefits and this was back when she was diagnosed with a PE.  Patient endorses few episodes of heartburn, perhaps once per month, and patient is a never smoker.  Per chart review patient has had 20 pound weight loss sinceNovember 2022 though patient states on her scale at home her weights have been stable.  Patient denies fevers, night sweats, chills. ? ?On assessment, given patient has had an improvement in her symptoms with switching to Zyrtec and she has daily symptoms suspect that her persistent cough currently likely secondary to her allergies.  Her allergic sinusitis is well managed with her Flonase.   Given patient has not needed to take Lasix for HFpEF in the last month suspect that this is not contributing to her cough.  Low concern for GERD causing cough and low concern for malignancy at this time.  If patient does have shortness of breath with activity and allergy medication is not improving symptoms, could consider adult onset asthma and either proceed with PFTs or trial albuterol.  Since cough initially began following COVID-19 infection in 2020, also on the differential is prolonged postinfectious cough secondary to COVID.  Patient endorses adherence to Xarelto and given persistent cough for the last 2 months, low suspicion this is secondary to PE at this time. ?Plan: ?-Continue Zyrtec daily ?-Continue Flonase ?-Return to clinic if symptoms worsen ?-Consider trial of albuterol or PFTs ?

## 2021-12-19 NOTE — Progress Notes (Signed)
? ?CC: Persistent cough ? ?This is a telephone encounter between [name] and [name] on 12/19/2021 for persistent cough. The visit was conducted with the patient located at home and [name] at [facility]. The patient's identity was confirmed using their DOB and current address. The patient has consented to being evaluated through a telephone encounter and understands the associated risks (an examination cannot be done and the patient may need to come in for an appointment) / benefits (allows the patient to remain at home, decreasing exposure to coronavirus). I personally spent 10 minutes on medical discussion.  ? ?HPI: ? ?[name] is a 66 y.o. with PMH as below.  ? ?Patient presents for 1 month follow-up for persistent cough.  At last appointment patient was seen for persistent cough following worsening allergic sinusitis with postnasal drip diagnosed a month prior.  2 months ago she had been treated with intranasal steroid and irrigation and this has resolved her symptoms though she had a persistent nonproductive cough.  Patient reports she has had a chronic cough since 2020 which started after she had COVID.  At last appointment patient was noted to have allergic rhinitis with edematous nasal mucosa and mucus and her provider recommended switching from daily Claritin to daily Zyrtec to see if a different antihistamine would improve her symptoms. ? ?Today patient reports an improvement in her cough since taking the Zyrtec which she has been taking daily.  Requires Zyrtec refill today.  She reports she is still having an every day cough though this is not interfering with her everyday activities.  Her Flonase is working well for her sinuses.  She does have a history of HFpEF and endorses shortness of breath only following long walks for example when walking to her car in the parking lot after work.  She has not needed to take any Lasix for swelling within the last month. Patient denies  history of asthma or COPD in the past, was prescribed an inhaler once without perceived benefits and this was back when she was diagnosed with a PE.  Patient endorses few episodes of heartburn, perhaps once per month, and patient is a never smoker.  Per chart review patient has had 20 pound weight loss sinceNovember 2022 though patient states on her scale at home her weights have been stable.  Patient denies fevers, night sweats, chills. ? ?On assessment, given patient has had an improvement in her symptoms with switching to Zyrtec and she has daily symptoms suspect that her persistent cough currently likely secondary to her allergies.  Her allergic sinusitis is well managed with her Flonase.  Given patient has not needed to take Lasix for HFpEF in the last month suspect that this is not contributing to her cough.  Low concern for GERD causing cough and low concern for malignancy at this time.  If patient does have shortness of breath with activity and allergy medication is not improving symptoms, could consider adult onset asthma and either proceed with PFTs or trial albuterol.  Since cough initially began following COVID-19 infection in 2020, also on the differential is prolonged postinfectious cough secondary to COVID. Patient endorses adherence to Xarelto and given persistent cough for the last 2 months, low suspicion this is secondary to PE at this time ?Plan: ?-Continue Zyrtec daily ?-Continue Flonase ?-Return to clinic if symptoms worsen ?-Consider trial of albuterol or PFTs ? ?Please see A&P for assessment of the patient's acute and chronic medical conditions.  ? ?Past Medical History:  ?  Diagnosis Date  ? Acute heart failure ([place]) 05/21/2018  ? Bronchitis due to COVID-19 virus 03/23/2019  ? DVT (deep venous thrombosis) ([place])   ? Gastroenteritis due to food toxin 11/02/2020  ? Hemorrhage of rectum and anus 05/06/2017  ? Hyperlipidemia   ? Hypertension   ? Leg pain   ? right  ? PE (pulmonary embolism)   ? Sleep  apnea   ? ?Review of Systems: As above. ? ? ? ?Assessment & Plan:  ? ?See Encounters Tab for problem based charting. ? ?Patient discussed with Dr.  Saverio Danker ? ?Natalie Denis, MD ?12/19/21, 1:34 PM ?Pager: [phone] ?Internal Medicine Resident, PGY-1 ?[facility]  ?  ? ?

## 2021-12-20 NOTE — Progress Notes (Signed)
Internal Medicine Clinic Attending ? ?Case discussed with Dr. Zinoviev  At the time of the visit.  We reviewed the resident?s history and exam and pertinent patient test results.  I agree with the assessment, diagnosis, and plan of care documented in the resident?s note.  ?

## 2021-12-21 ENCOUNTER — Telehealth: Payer: Self-pay | Admitting: *Deleted

## 2021-12-21 ENCOUNTER — Other Ambulatory Visit (HOSPITAL_COMMUNITY): Payer: Self-pay

## 2021-12-21 DIAGNOSIS — J301 Allergic rhinitis due to pollen: Secondary | ICD-10-CM

## 2021-12-21 MED ORDER — CETIRIZINE HCL 10 MG PO TABS
10.0000 mg | ORAL_TABLET | Freq: Every day | ORAL | 2 refills | Status: DC
  Filled 2021-12-21: qty 30, 30d supply, fill #0
  Filled 2022-01-24: qty 30, 30d supply, fill #1
  Filled 2022-02-28: qty 30, 30d supply, fill #2
  Filled 2022-04-02: qty 30, 30d supply, fill #3
  Filled 2022-05-06 (×2): qty 30, 30d supply, fill #4

## 2021-12-21 NOTE — Telephone Encounter (Addendum)
Message from Pharmacy Dr. Simmie Davies 's prescription for Certizine  will need to be redone by another physican as patient's insurance will not accept prescriptions from Physician. ?

## 2021-12-21 NOTE — Telephone Encounter (Signed)
Ok. I have reordered and sent to the [facility]. ?

## 2021-12-24 ENCOUNTER — Other Ambulatory Visit (HOSPITAL_COMMUNITY): Payer: Self-pay

## 2021-12-31 ENCOUNTER — Other Ambulatory Visit (HOSPITAL_COMMUNITY): Payer: Self-pay

## 2022-01-04 ENCOUNTER — Other Ambulatory Visit: Payer: Self-pay

## 2022-01-04 ENCOUNTER — Ambulatory Visit (INDEPENDENT_AMBULATORY_CARE_PROVIDER_SITE_OTHER): Payer: Self-pay | Admitting: Internal Medicine

## 2022-01-04 ENCOUNTER — Encounter: Payer: Self-pay | Admitting: Internal Medicine

## 2022-01-04 DIAGNOSIS — Z0000 Encounter for general adult medical examination without abnormal findings: Secondary | ICD-10-CM

## 2022-01-04 DIAGNOSIS — M65342 Trigger finger, left ring finger: Secondary | ICD-10-CM | POA: Insufficient documentation

## 2022-01-04 DIAGNOSIS — R053 Chronic cough: Secondary | ICD-10-CM

## 2022-01-04 DIAGNOSIS — R058 Other specified cough: Secondary | ICD-10-CM

## 2022-01-04 DIAGNOSIS — I10 Essential (primary) hypertension: Secondary | ICD-10-CM

## 2022-01-04 HISTORY — DX: Trigger finger, left ring finger: M65.342

## 2022-01-04 NOTE — Patient Instructions (Signed)
Thank you, Ms.[name] for allowing [place] to provide your care today. Today we discussed:  Blood Pressure: -Continue taking your amlodipine, coreg, chlorthalidone (and Lasix as needed) -Your blood pressure looks pretty good today -Call to see if your orange card was accepted and we will try to check your kidney function next time you come.  Chronic cough: -Take Zyrtec once a day -Use Flonase over the counter per the instructions on the label -Use your nose irrigation once a week   Trigger finger: -We can consider a referral to orthopedics if the finger is bothering you. They can do injections or surgery if it is bothering you too much   My Chart Access: [email or web address]  Please follow-up in 3 months for a routine appointment and to check BMP once you have the orange card.  Please make sure to arrive 15 minutes prior to your next appointment. If you arrive late, you may be asked to reschedule.    We look forward to seeing you next time. Please call our clinic at [phone] if you have any questions or concerns. The best time to call is Monday-Friday from 9am-4pm, but there is someone available 24/7. If after hours or the weekend, call the main hospital number and ask for the Internal Medicine Resident On-Call. If you need medication refills, please notify your pharmacy one week in advance and they will send [place] a request.   Thank you for letting us take part in your care. Wishing you the best!  Ellison Carwin, MD 01/04/2022, 11:39 AM IM Resident, PGY-1

## 2022-01-04 NOTE — Assessment & Plan Note (Signed)
Patient finished paperwork for orange card and reports she submitted it. She needs to call to check in to see if orange card was approved. Once she has coverage we can move forward with discussing: -HCV screening -Tetanus booster -Mammogram -Shingles vaccination -Pap smear

## 2022-01-04 NOTE — Assessment & Plan Note (Deleted)
Patient continues to endorse daily cough, mostly at night time and in the mornings. She continues to have sinus congestion most days as well as post nasal drip. She is using Zyrtec daily and nasal irrigation weekly and these help. She has not used flonase recently. She denies reflux, smoking history, orthopnea, sleeps flat. Has not been using omeprazole. Her coughing bothers her but is not getting in the way of daily activities. She believes pollen is the biggest trigger for her allergies. No wheezing, crackles or rhonchi on exam.  On assessment, patient has had persistent cough in the setting of chronic rhinitis with post nasal drip. 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 Believe GERD, CHF not contributing to this since she denies recent reflux symptoms and no orthopnea, no crackles on exam.  Plan: -Continue daily Zyrtec -Restart Flonase OTC -Nasal irrigation weekly

## 2022-01-04 NOTE — Assessment & Plan Note (Addendum)
Patient continues to endorse daily cough, mostly at night time and in the mornings. She continues to have sinus congestion most days as well as post nasal drip. She is using Zyrtec daily and nasal irrigation weekly and these help. She has not used flonase recently. She denies reflux, smoking history, orthopnea, sleeps flat. Has not been using omeprazole. Her coughing bothers her but is not getting in the way of daily activities. She believes pollen is the biggest trigger for her allergies. No wheezing, crackles or rhonchi on exam.  On assessment, patient has had persistent cough in the setting of chronic rhinitis with post nasal drip. 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 Believe GERD, CHF not contributing to this since she denies recent reflux symptoms and no orthopnea, no crackles on exam.  Plan: -Continue daily Zyrtec -Restart Flonase OTC -Nasal irrigation weekly

## 2022-01-04 NOTE — Progress Notes (Signed)
CC: routine appointment  HPI:  [name] is a 66 y.o. female with a past medical history stated below.   Trigger finger, left ring finger Patient reports her left ring finger gets stuck in a flexed position when she wake up in the mornings and she needs to pry it back to extend the finger. This will also happen some evenings. This is non painful for her. No other fingers are affected. Currently this annoys her but doesn't bother her too much. She is self pay, reports she sent in her paperwork to obtain coverage in the [employer] system but has not called back to see if she was approved. Discussed she would need to get the orange card for [place] to send to to orthopedic surgery if the trigger finger were ever to bother her enough to consider cortisone injections or surgery. Plan: -Printout on trigger finger provided -Call number on front of paperwork to see if coverage approved to determine next possible steps  Essential hypertension Patient's BP today 135/80. On amlodipine 5mg  daily, coreg 6.25mg  BID, chlorthalidone 25mg  daily. Also on Lasix 20mg  PRN. Endorses adherence. Patient due for BMP though currently not insured.  Patient's HTN currently well controlled on antihypertensive regimen. Patient prefers to wait to get coverage rather than spend $42 on BMP today. Plan: -Continue amlodipine, coreg, chlorthalidone, and PRN Lasix as above -Check in to see if orange card was approved so we can obtain BMP at next OV  Healthcare maintenance Patient finished paperwork for orange card and reports she submitted it. She needs to call to check in to see if orange card was approved. Once she has coverage we can move forward with discussing: -HCV screening -Tetanus booster -Mammogram -Shingles vaccination -Pap smear  Chronic cough Patient continues to endorse daily cough, mostly at night time and in the mornings. She continues to have sinus congestion most days as well as post nasal drip. She is  using Zyrtec daily and nasal irrigation weekly and these help. She has not used flonase recently. She denies reflux, smoking history, orthopnea, sleeps flat. Has not been using omeprazole. Her coughing bothers her but is not getting in the way of daily activities. She believes pollen is the biggest trigger for her allergies. No wheezing, crackles or rhonchi on exam.  On assessment, patient has had persistent cough in the setting of chronic rhinitis with post nasal drip. 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 Believe GERD, CHF not contributing to this since she denies recent reflux symptoms and no orthopnea, no crackles on exam.  Plan: -Continue daily Zyrtec -Restart Flonase OTC -Nasal irrigation weekly   Past Medical History:  Diagnosis Date   Acute heart failure ([facility]) 05/21/2018   Bronchitis due to COVID-19 virus 03/23/2019   DVT (deep venous thrombosis) ([facility])    Gastroenteritis due to food toxin 11/02/2020   Hemorrhage of rectum and anus 05/06/2017   Hyperlipidemia    Hypertension    Leg pain    right   PE (pulmonary embolism)    Sleep apnea     Current Outpatient Medications on File Prior to Visit  Medication Sig Dispense Refill   amLODipine (NORVASC) 5 MG tablet Take 1 tablet (5 mg total) by mouth daily. 90 tablet 2   atorvastatin (LIPITOR) 40 MG tablet Take 1 tablet (40 mg total) by mouth daily. 90 tablet 2   carvedilol (COREG) 6.25 MG tablet Take 1  tablet (6.25 mg total) by mouth 2 (two) times daily with a meal. 60 tablet 2   cetirizine (ZYRTEC ALLERGY) 10 MG tablet Take 1 tablet (10 mg total) by mouth daily. 90 tablet 2   chlorthalidone (HYGROTON) 25 MG tablet Take 0.5 tablets (12.5 mg total) by mouth daily. 45 tablet 2   fluticasone (FLONASE) 50 MCG/ACT nasal spray Place 1 spray into both nostrils daily. 16 g 2   furosemide (LASIX) 20 MG tablet Take 1 tablet (20 mg total)  by mouth daily as needed. 90 tablet 2   guaiFENesin-dextromethorphan (ROBITUSSIN DM) 100-10 MG/5ML syrup Take 5-10 mLs by mouth every 8 (eight) hours as needed for cough. 236 mL 1   omeprazole (PRILOSEC) 20 MG capsule Take 2 capsules (40 mg total) by mouth 2 (two) times daily before a meal. 168 capsule 0   rivaroxaban (XARELTO) 20 MG TABS tablet Take 1 tablet (20 mg total) by mouth daily with supper. 90 tablet 2   No current facility-administered medications on file prior to visit.    Family History  Problem Relation Age of Onset   Hypertension Other    Heart disease Other    Diabetes Other    Coronary artery disease Other    Review of Systems: ROS negative except for what is noted on the assessment and plan.  Vitals:   01/04/22 1014  BP: 135/80  Pulse: 67  Temp: 98.2 F (36.8 C)  TempSrc: Oral  SpO2: 96%  Weight: 217 lb (98.4 kg)  Height: 5\' 3"  (1.6 m)     Physical Exam: General: Well appearing african female, overweight, NAD HENT: normocephalic, atraumatic, external ears and nares appear unremarkable EYES: conjunctiva non-erythematous, no scleral icterus CV: regular rate, normal rhythm, no murmurs, rubs, gallops. Trace LEE bilaterally Pulmonary: normal work of breathing on RA, lungs clear to auscultation, no rales, wheezes, rhonchi Abdominal: non-distended, soft, non-tender to palpation, normal BS Skin: Warm and dry, no rashes or lesions on exposed surfaces Neurological: MS: awake, alert and oriented x3, normal speech and fund of knowledge Motor: moves all extremities antigravity Psych: normal affect     See Encounters Tab for problem based charting.  Patient discussed with Dr. [place], M.D. [facility] Internal Medicine, PGY-1 Pager: [phone] Date 01/04/2022 Time 12:26 PM

## 2022-01-04 NOTE — Assessment & Plan Note (Addendum)
Patient's BP today 135/80. On amlodipine 5mg  daily, coreg 6.25mg  BID, chlorthalidone 25mg  daily. Also on Lasix 20mg  PRN. Endorses adherence. Patient due for BMP though currently not insured.  Patient's HTN currently well controlled on antihypertensive regimen. Patient prefers to wait to get coverage rather than spend $42 on BMP today. Plan: -Continue amlodipine, coreg, chlorthalidone, and PRN Lasix as above -Check in to see if orange card was approved so we can obtain BMP at next OV

## 2022-01-04 NOTE — Assessment & Plan Note (Signed)
Patient reports her left ring finger gets stuck in a flexed position when she wake up in the mornings and she needs to pry it back to extend the finger. This will also happen some evenings. This is non painful for her. No other fingers are affected. Currently this annoys her but doesn't bother her too much. She is self pay, reports she sent in her paperwork to obtain coverage in the [employer] [place] but has not called back to see if she was approved. Discussed she would need to get the orange card for [place] to send to to orthopedic surgery if the trigger finger were ever to bother her enough to consider cortisone injections or surgery. Plan: -Printout on trigger finger provided -Call number on front of paperwork to see if coverage approved to determine next possible steps

## 2022-01-04 NOTE — Assessment & Plan Note (Signed)
>>  ASSESSMENT AND PLAN FOR CHRONIC COUGH WRITTEN ON 01/04/2022 12:26 PM BY ZINOVIEV, EVA, MD  Patient continues to endorse daily cough, mostly at night time and in the mornings. She continues to have sinus congestion most days as well as post nasal drip. She is using Zyrtec daily and nasal irrigation weekly and these help. She has not used flonase recently. She denies reflux, smoking history, orthopnea, sleeps flat. Has not been using omeprazole. Her coughing bothers her but is not getting in the way of daily activities. She believes pollen is the biggest trigger for her allergies. No wheezing, crackles or rhonchi on exam.  On assessment, patient has had persistent cough in the setting of chronic rhinitis with post nasal drip. 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 Believe GERD, CHF not contributing to this since she denies recent reflux symptoms and no orthopnea, no crackles on exam.  Plan: -Continue daily Zyrtec -Restart Flonase OTC -Nasal irrigation weekly

## 2022-01-08 NOTE — Progress Notes (Signed)
Internal Medicine Clinic Attending ? ?Case discussed with Dr. Zinoviev  At the time of the visit.  We reviewed the resident?s history and exam and pertinent patient test results.  I agree with the assessment, diagnosis, and plan of care documented in the resident?s note.  ?

## 2022-01-11 ENCOUNTER — Emergency Department (HOSPITAL_COMMUNITY): Payer: Self-pay

## 2022-01-11 ENCOUNTER — Encounter (HOSPITAL_COMMUNITY): Payer: Self-pay | Admitting: Emergency Medicine

## 2022-01-11 ENCOUNTER — Emergency Department (HOSPITAL_COMMUNITY)
Admission: EM | Admit: 2022-01-11 | Discharge: 2022-01-11 | Disposition: A | Discharge status: Home / Self Care | Payer: Self-pay | Attending: Emergency Medicine | Admitting: Emergency Medicine

## 2022-01-11 ENCOUNTER — Other Ambulatory Visit: Payer: Self-pay

## 2022-01-11 DIAGNOSIS — M25471 Effusion, right ankle: Secondary | ICD-10-CM | POA: Insufficient documentation

## 2022-01-11 DIAGNOSIS — Z79899 Other long term (current) drug therapy: Secondary | ICD-10-CM | POA: Insufficient documentation

## 2022-01-11 DIAGNOSIS — M25571 Pain in right ankle and joints of right foot: Secondary | ICD-10-CM | POA: Insufficient documentation

## 2022-01-11 DIAGNOSIS — Z7901 Long term (current) use of anticoagulants: Secondary | ICD-10-CM | POA: Insufficient documentation

## 2022-01-11 DIAGNOSIS — I10 Essential (primary) hypertension: Secondary | ICD-10-CM | POA: Insufficient documentation

## 2022-01-11 MED ORDER — HYDROCODONE-ACETAMINOPHEN 5-325 MG PO TABS: 1.0000 | ORAL_TABLET | Freq: Four times a day (QID) | ORAL | 0 refills | Status: DC | PRN

## 2022-01-11 MED ORDER — OXYCODONE-ACETAMINOPHEN 5-325 MG PO TABS
1.0000 | ORAL_TABLET | Freq: Once | ORAL | Status: AC
  Administered 2022-01-11: 1 via ORAL
  Filled 2022-01-11: qty 1

## 2022-01-11 NOTE — Discharge Instructions (Addendum)
I have prescribed you a strong narcotic called vicodin. Please only take this as prescribed. This medication also has tylenol in it, so please be sure you are not taking more than 3000 mg of tylenol per day. Do not drive or operate heavy machinery after taking this medication. Do not mix it with alcohol.   Please continue to wear the postop shoe that you were given as needed for comfort.  I would also recommend icing the ankle 1-2 times per day and elevating it to help with the pain and inflammation.  Below is the contact information for Dr. Aundria Rud.  He is a local orthopedic specialist.  Please contact the phone number below and schedule an appointment for reevaluation.  If you develop any new or worsening symptoms such as worsening pain, swelling, fevers, vomiting, or just generally worsening symptoms, please come back to the emergency department immediately for reevaluation.

## 2022-01-11 NOTE — ED Provider Triage Note (Signed)
Emergency Medicine Provider Triage Evaluation Note  ERICK OXENDINE , a 66 y.o. female  was evaluated in triage.  Pt complains of right foot and ankle pain without known injury.  She reports that she does spend a lot of time on her feet and thinks that it could be secondary to this.  She was told to get evaluated for a blood clot because she has had one previously.  She does chronically take Xarelto and has not missed any doses.  She denies chest pain, shortness of breath, hemoptysis, recent travel.  Patient denies any numbness of the foot.  She reports the pain is sharp, stabbing in nature.  He has not taken anything for pain prior to arrival.  Review of Systems  Positive: Foot, ankle pain Negative: Numbness, tingling  Physical Exam  BP 140/70   Pulse 81   Temp 98.5 F (36.9 C) (Oral)   Resp 16   Ht 5\' 3"  (1.6 m)   Wt 98 kg   SpO2 94%   BMI 38.27 kg/m  Gen:   Awake, no distress   Resp:  Normal effort  MSK:   Moves extremities without difficulty  Other:  Patient has swelling of bilateral lower extremities, right does not seem significantly greater than left.  She has intact DP, PT pulses.  She has intact range of motion both passively and actively, she is quite tender to palpation of the right ankle without step-off or deformity noted.  Medical Decision Making  Medically screening exam initiated at 6:45 PM.  Appropriate orders placed.  was informed that the remainder of the evaluation will be completed by another provider, this initial triage assessment does not replace that evaluation, and the importance of remaining in the ED until their evaluation is complete.  Workup initiated   [name], Olene Floss 01/11/22 1846

## 2022-01-11 NOTE — ED Triage Notes (Signed)
Patient here with R ankle swelling and pain. This has been going on for a week and patient now deciding to be evaluated for it. Pt feels a burning sensation to the foot. Patient with distal PNS intact but states she can barely stand up on it.

## 2022-01-11 NOTE — ED Provider Notes (Signed)
[facility] Provider Note   CSN: [id number] Arrival date & time: 01/11/22  1801     History  Chief Complaint  Patient presents with   Ankle Pain    [name] is a 66 y.o. female.  HPI  Patient is a 66 year old female with a hyperlipidemia, hypertension, PE/DVT on Xarelto, HFpEF, who presents to the emergency department due to right ankle pain.  States that her symptoms started about 1 week ago.  No known trauma.  She has had worsening pain and swelling of the right ankle and foot.  States that she is still ambulating but this worsens her pain.  Denies fevers, chills, nausea, or vomiting.  Denies history of gout.     Home Medications Prior to Admission medications   Medication Sig Start Date End Date Taking? Authorizing Provider  HYDROcodone-acetaminophen (NORCO/VICODIN) 5-325 MG tablet Take 1 tablet by mouth every 6 (six) hours as needed. 01/11/22  Yes Placido SouJoldersma, Lachae Hohler, PA-C  amLODipine (NORVASC) 5 MG tablet Take 1 tablet (5 mg total) by mouth daily. 12/05/21   Masters, Katie, DO  atorvastatin (LIPITOR) 40 MG tablet Take 1 tablet (40 mg total) by mouth daily. 08/28/21   Masters, Katie, DO  carvedilol (COREG) 6.25 MG tablet Take 1 tablet (6.25 mg total) by mouth 2 (two) times daily with a meal. 11/23/21   Masters, Florentina AddisonKatie, DO  cetirizine (ZYRTEC ALLERGY) 10 MG tablet Take 1 tablet (10 mg total) by mouth daily. 12/21/21   Tyson AliasVincent, Duncan Thomas, MD  chlorthalidone (HYGROTON) 25 MG tablet Take 0.5 tablets (12.5 mg total) by mouth daily. 09/27/21   Masters, Katie, DO  fluticasone (FLONASE) 50 MCG/ACT nasal spray Place 1 spray into both nostrils daily. 10/24/21   Evlyn KannerBraswell, Phillip, MD  furosemide (LASIX) 20 MG tablet Take 1 tablet (20 mg total) by mouth daily as needed. 10/24/21   Evlyn KannerBraswell, Phillip, MD  guaiFENesin-dextromethorphan (ROBITUSSIN DM) 100-10 MG/5ML syrup Take 5-10 mLs by mouth every 8 (eight) hours as needed for cough. 06/10/19   Angelita InglesWinfrey,  William B, MD  omeprazole (PRILOSEC) 20 MG capsule Take 2 capsules (40 mg total) by mouth 2 (two) times daily before a meal. 08/07/21 09/18/21  Gwenevere AbbotKhan, Ghalib, MD  rivaroxaban (XARELTO) 20 MG TABS tablet Take 1 tablet (20 mg total) by mouth daily with supper. 10/24/21   Evlyn KannerBraswell, Phillip, MD      Allergies    Tramadol and Ciprofloxacin    Review of Systems   Review of Systems  All other systems reviewed and are negative. Ten systems reviewed and are negative for acute change, except as noted in the HPI.   Physical Exam Updated Vital Signs BP 140/70   Pulse 81   Temp 98.5 F (36.9 C) (Oral)   Resp 16   Ht 5\' 3"  (1.6 m)   Wt 98 kg   SpO2 94%   BMI 38.27 kg/m  Physical Exam Vitals and nursing note reviewed.  Constitutional:      General: She is not in acute distress.    Appearance: She is well-developed.  HENT:     Head: Normocephalic and atraumatic.     Right Ear: External ear normal.     Left Ear: External ear normal.  Eyes:     General: No scleral icterus.       Right eye: No discharge.        Left eye: No discharge.     Conjunctiva/sclera: Conjunctivae normal.  Neck:     Trachea: No tracheal  deviation.  Cardiovascular:     Rate and Rhythm: Normal rate.  Pulmonary:     Effort: Pulmonary effort is normal. No respiratory distress.     Breath sounds: No stridor.  Abdominal:     General: There is no distension.  Musculoskeletal:        General: Swelling and tenderness present. No deformity.     Cervical back: Neck supple.     Comments: Moderate TTP noted circumferentially about the right ankle.  Mild soft tissue swelling in the region.  No erythema.  No increased warmth.  Additional moderate TTP noted along the heel of the foot.  Unable to assess range of motion of the ankle due to patient's pain.  Distal sensation intact.  Wiggling the toes without difficulty.  Good cap refill.  Palpable pedal pulses.  Skin:    General: Skin is warm and dry.     Findings: No rash.   Neurological:     Mental Status: She is alert.     Cranial Nerves: Cranial nerve deficit: no gross deficits.   ED Results / Procedures / Treatments   Labs (all labs ordered are listed, but only abnormal results are displayed) Labs Reviewed - No data to display  EKG None  Radiology DG Ankle Complete Right  Result Date: 01/11/2022 CLINICAL DATA:  Foot pain, swelling EXAM: RIGHT ANKLE - COMPLETE 3+ VIEW COMPARISON:  None Available. FINDINGS: Diffuse soft tissue swelling. No acute bony abnormality. Specifically, no fracture, subluxation, or dislocation. Plantar and posterior calcaneal spurs. IMPRESSION: Diffuse soft tissue swelling.  No acute bony abnormality. Electronically Signed   By: Charlett Nose M.D.   On: 01/11/2022 19:43   DG Foot Complete Right  Result Date: 01/11/2022 CLINICAL DATA:  Pain EXAM: RIGHT FOOT COMPLETE - 3+ VIEW COMPARISON:  None Available. FINDINGS: Plantar and posterior calcaneal spurs. Soft tissue swelling about the ankle. No acute bony abnormality. Specifically, no fracture, subluxation, or dislocation. Joint spaces maintained. IMPRESSION: No acute bony abnormality. Electronically Signed   By: Charlett Nose M.D.   On: 01/11/2022 19:43    Procedures Procedures   Medications Ordered in ED Medications  oxyCODONE-acetaminophen (PERCOCET/ROXICET) 5-325 MG per tablet 1 tablet (1 tablet Oral Given 01/11/22 2040)    ED Course/ Medical Decision Making/ A&P                           Medical Decision Making Amount and/or Complexity of Data Reviewed Radiology: ordered.  Risk Prescription drug management.  Pt is a 66 y.o. female who presents to the emergency department due to atraumatic right ankle and foot pain that began about 1 week ago.  Symptoms have been worsening.  Imaging: X-ray of the right ankle shows diffuse soft tissue swelling.  No acute bony abnormality. X-ray of the right foot shows no acute bony abnormality.  I, Placido Sou, PA-C, personally  reviewed and evaluated these images and lab results as part of my medical decision-making.  On my exam patient has moderate tenderness circumferentially about the right ankle with mild soft tissue swelling.  Additional moderate tenderness noted along the heel of the right foot.  Neurovascularly intact distal to the region.  No increased warmth.  No erythema.  X-rays were obtained in triage which does show soft tissue swelling in the right ankle but otherwise no acute bony abnormalities in the ankle or the foot.  Patient's symptoms appear musculoskeletal in nature.  Patient afebrile, nontachycardic, and nontoxic-appearing.  Denies any  fevers, chills, nausea, or vomiting.  No increased warmth, erythema, or systemic symptoms concerning for septic joint at this time.  Patient denies a history of gout.  Given that she is anticoagulated will not initiate NSAIDs at this time.  Patient appears stable for discharge at this time and she is agreeable.  We will place her in a postop shoe for comfort.  We will give her a short prescription for Vicodin for breakthrough pain.  We discussed safety regarding this medication.  Patient given a referral to orthopedics.  Discussed return precautions at length.  Her questions were answered and she was amicable at the time of discharge.  Note: Portions of this report may have been transcribed using voice recognition software. Every effort was made to ensure accuracy; however, inadvertent computerized transcription errors may be present.   Final Clinical Impression(s) / ED Diagnoses Final diagnoses:  Pain and swelling of right ankle   Rx / DC Orders ED Discharge Orders          Ordered    HYDROcodone-acetaminophen (NORCO/VICODIN) 5-325 MG tablet  Every 6 hours PRN        01/11/22 2224              Placido Sou, PA-C 01/11/22 2234    Pricilla Loveless, MD 01/13/22 [phone]

## 2022-01-24 ENCOUNTER — Other Ambulatory Visit (HOSPITAL_COMMUNITY): Payer: Self-pay

## 2022-02-28 ENCOUNTER — Other Ambulatory Visit (HOSPITAL_COMMUNITY): Payer: Self-pay

## 2022-02-28 ENCOUNTER — Other Ambulatory Visit: Payer: Self-pay | Admitting: Internal Medicine

## 2022-02-28 DIAGNOSIS — I10 Essential (primary) hypertension: Secondary | ICD-10-CM

## 2022-02-28 MED ORDER — CARVEDILOL 6.25 MG PO TABS
6.2500 mg | ORAL_TABLET | Freq: Two times a day (BID) | ORAL | 2 refills | Status: DC
  Filled 2022-02-28 – 2022-03-01 (×2): qty 151, 76d supply, fill #0
  Filled 2022-03-01: qty 29, 14d supply, fill #0
  Filled 2022-06-04: qty 60, 30d supply, fill #1

## 2022-02-28 NOTE — Telephone Encounter (Signed)
Please have patient follow-up for htn and BMP.

## 2022-03-01 ENCOUNTER — Other Ambulatory Visit (HOSPITAL_COMMUNITY): Payer: Self-pay

## 2022-03-14 ENCOUNTER — Other Ambulatory Visit (HOSPITAL_COMMUNITY): Payer: Self-pay

## 2022-03-19 DIAGNOSIS — M7671 Peroneal tendinitis, right leg: Secondary | ICD-10-CM | POA: Diagnosis not present

## 2022-03-27 DIAGNOSIS — M25571 Pain in right ankle and joints of right foot: Secondary | ICD-10-CM | POA: Diagnosis not present

## 2022-04-02 ENCOUNTER — Other Ambulatory Visit (HOSPITAL_COMMUNITY): Payer: Self-pay

## 2022-04-03 DIAGNOSIS — M25571 Pain in right ankle and joints of right foot: Secondary | ICD-10-CM | POA: Diagnosis not present

## 2022-04-10 DIAGNOSIS — M25571 Pain in right ankle and joints of right foot: Secondary | ICD-10-CM | POA: Diagnosis not present

## 2022-04-12 ENCOUNTER — Other Ambulatory Visit (HOSPITAL_COMMUNITY): Payer: Self-pay

## 2022-04-16 ENCOUNTER — Encounter: Payer: Self-pay | Admitting: Internal Medicine

## 2022-04-16 ENCOUNTER — Other Ambulatory Visit: Payer: Self-pay

## 2022-04-16 ENCOUNTER — Ambulatory Visit (INDEPENDENT_AMBULATORY_CARE_PROVIDER_SITE_OTHER): Payer: 59

## 2022-04-16 VITALS — BP 115/65 | HR 63 | Temp 98.1°F | Resp 24 | Ht 63.0 in | Wt 216.8 lb

## 2022-04-16 DIAGNOSIS — M65342 Trigger finger, left ring finger: Secondary | ICD-10-CM | POA: Diagnosis not present

## 2022-04-16 DIAGNOSIS — Z0000 Encounter for general adult medical examination without abnormal findings: Secondary | ICD-10-CM

## 2022-04-16 DIAGNOSIS — I110 Hypertensive heart disease with heart failure: Secondary | ICD-10-CM | POA: Diagnosis not present

## 2022-04-16 DIAGNOSIS — I5030 Unspecified diastolic (congestive) heart failure: Secondary | ICD-10-CM

## 2022-04-16 DIAGNOSIS — R053 Chronic cough: Secondary | ICD-10-CM

## 2022-04-16 DIAGNOSIS — Z23 Encounter for immunization: Secondary | ICD-10-CM

## 2022-04-16 DIAGNOSIS — I10 Essential (primary) hypertension: Secondary | ICD-10-CM

## 2022-04-16 NOTE — Assessment & Plan Note (Signed)
Patient continues to have chronic nonproductive cough.  Cough is not associated with meals, time of day, seasonal allergies, cold/warm weather.  No chest pain or dyspnea.  She also does have some postnasal drip which she says is minimally improved by Zyrtec, Flonase, nasal irrigation.  She has previously taken a trial of PPI for 8 weeks which did not help.  She also got a chest x-ray last year which was not remarkable for any etiologies.  Of note, she mentions today that she used to work at a [employer] with [employer] and [employer] for over 7 years and she did not wear a respirator at that time.  - At this time, primary suspicion is for cough variant asthma.  Possibility of occupational lung disease cannot be ruled out either.  Will obtain PFTs and follow-up as appropriate.

## 2022-04-16 NOTE — Assessment & Plan Note (Signed)
>>  ASSESSMENT AND PLAN FOR CHRONIC COUGH WRITTEN ON 04/16/2022  2:27 PM BY Linus Galas, MD  Patient continues to have chronic nonproductive cough.  Cough is not associated with meals, time of day, seasonal allergies, cold/warm weather.  No chest pain or dyspnea.  She also does have some postnasal drip which she says is minimally improved by Zyrtec, Flonase, nasal irrigation.  She has previously taken a trial of PPI for 8 weeks which did not help.  She also got a chest x-ray last year which was not remarkable for any etiologies.  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  - At this time, primary suspicion is for cough variant asthma.  Possibility of occupational lung disease cannot be ruled out either.  Will obtain PFTs and follow-up as appropriate.

## 2022-04-16 NOTE — Progress Notes (Signed)
   CC: f\u HTN, cough, HCM  HPI:  [name] is a 66 y.o.-year-old female with past medical history as below presenting for f\u HTN, cough, HCM.  Please see encounters tab for problem-based charting.  Past Medical History:  Diagnosis Date   Acute heart failure (HCC) 05/21/2018   Bronchitis due to COVID-19 virus 03/23/2019   DVT (deep venous thrombosis) (HCC)    Gastroenteritis due to food toxin 11/02/2020   Hemorrhage of rectum and anus 05/06/2017   Hyperlipidemia    Hypertension    Leg pain    right   PE (pulmonary embolism)    Sleep apnea    Review of Systems: As in HPI.  Please see encounters tab for problem is charting. Marland Kitchen  Physical Exam:  Vitals:   04/16/22 1101  BP: 115/65  Pulse: 63  Resp: (!) 24  Temp: 98.1 F (36.7 C)  TempSrc: Oral  SpO2: 97%  Weight: 216 lb 12.8 oz (98.3 kg)  Height: 5\' 3"  (1.6 m)   General:Well-appearing, pleasant, In NAD Cardiac: RRR, no murmurs rubs or gallops. Respiratory: Normal work of breathing on room air, CTA B Abdominal: Soft, nontender, nondistended   Assessment & Plan:   Essential hypertension BP today 115/65.  Patient reporting no side effects of current antihypertensive regimen and is adherent with her amlodipine 5 mg daily, Coreg 6.25 mg twice daily, chlorthalidone 12.5 mg daily.  We will obtain BMP today and CTM.  (HFpEF) heart failure with preserved ejection fraction (HCC) Denying any current symptoms.  Has not had to take PRN Lasix in the past 2 weeks.  Will CTM  Healthcare maintenance - Patient is s/p total hysterectomy for endometriosis including removal of cervix.  No further indication for Pap smears. - Tdap administered today - Hep C obtained today - Mammogram referral sent out - Shingles vaccine at next opportunity  Chronic cough Patient continues to have chronic nonproductive cough.  Cough is not associated with meals, time of day, seasonal allergies, cold/warm weather.  No chest pain or dyspnea.   She also does have some postnasal drip which she says is minimally improved by Zyrtec, Flonase, nasal irrigation.  She has previously taken a trial of PPI for 8 weeks which did not help.  She also got a chest x-ray last year which was not remarkable for any etiologies.  Of note, she mentions today that she used to work at a with fabrics and dyes for over 7 years and she did not wear a respirator at that time.  - At this time, primary suspicion is for cough variant asthma.  Possibility of occupational lung disease cannot be ruled out either.  Will obtain PFTs and follow-up as appropriate.  Trigger finger, left ring finger Stable without worsening symptoms.  She does not feel like she needs any interventions at this time.    Patient seen with Dr. Associate Professor

## 2022-04-16 NOTE — Assessment & Plan Note (Signed)
BP today 115/65.  Patient reporting no side effects of current antihypertensive regimen and is adherent with her amlodipine 5 mg daily, Coreg 6.25 mg twice daily, chlorthalidone 12.5 mg daily.  We will obtain BMP today and CTM.

## 2022-04-16 NOTE — Assessment & Plan Note (Signed)
-   Patient is s/p total hysterectomy for endometriosis including removal of cervix.  No further indication for Pap smears. - Tdap administered today - Hep C obtained today - Mammogram referral sent out - Shingles vaccine at next opportunity

## 2022-04-16 NOTE — Assessment & Plan Note (Signed)
Stable without worsening symptoms.  She does not feel like she needs any interventions at this time.

## 2022-04-16 NOTE — Patient Instructions (Signed)
[name], it was a pleasure seeing you today! You endorsed feeling well today. Below are some of the things we talked about this visit. We look forward to seeing you in the follow up appointment!  Today we discussed: Blood pressure: Your blood pressure looks great today.  Please continue taking your medications as prescribed and let us know if you need refills.  We will check your kidney function today and follow-up with those results.  Chronic cough: It seems like the previous attempts at treating her chronic cough have not been successful.  The neck step would be to further work it up with lung tests.  We will put in a referral for pulmonary function test and they will contact you about getting those done and we will follow-up with the results.  Healthcare maintenance: We will get hepatitis C labs, put in a referral for mammogram, and do a Tdap vaccine today.  At your next visit, we'll talk about other maintenance goals.  I have ordered the following labs today:   Lab Orders         BMP8+Anion Gap         Hepatitis C Ab reflex to Quant PCR       Referrals ordered today:   Referral Orders  No referral(s) requested today     I have ordered the following medication/changed the following medications:   Stop the following medications: There are no discontinued medications.   Start the following medications: No orders of the defined types were placed in this encounter.    Follow-up: 3 Months after PFTs   Please make sure to arrive 15 minutes prior to your next appointment. If you arrive late, you may be asked to reschedule.   We look forward to seeing you next time. Please call our clinic at [phone] if you have any questions or concerns. The best time to call is Monday-Friday from 9am-4pm, but there is someone available 24/7. If after hours or the weekend, call the main hospital number and ask for the Internal Medicine Resident On-Call. If you need medication refills,  please notify your pharmacy one week in advance and they will send [place] a request.  Thank you for letting us take part in your care. Wishing you the best!  Thank you, Lyndle Herrlich MD

## 2022-04-16 NOTE — Assessment & Plan Note (Signed)
Denying any current symptoms.  Has not had to take PRN Lasix in the past 2 weeks.  Will CTM

## 2022-04-17 LAB — BMP8+ANION GAP
Anion Gap: 16 mmol/L (ref 10.0–18.0)
BUN/Creatinine Ratio: 13 (ref 12–28)
BUN: 12 mg/dL (ref 8–27)
CO2: 26 mmol/L (ref 20–29)
Calcium: 9.3 mg/dL (ref 8.7–10.3)
Chloride: 102 mmol/L (ref 96–106)
Creatinine, Ser: 0.89 mg/dL (ref 0.57–1.00)
Glucose: 98 mg/dL (ref 70–99)
Potassium: 3.3 mmol/L — ABNORMAL LOW (ref 3.5–5.2)
Sodium: 144 mmol/L (ref 134–144)
eGFR: 72 mL/min/{1.73_m2} (ref >59)

## 2022-04-17 LAB — HCV AB W REFLEX TO QUANT PCR: HCV Ab: NONREACTIVE

## 2022-04-17 LAB — HCV INTERPRETATION

## 2022-04-24 NOTE — Addendum Note (Signed)
Addended by: Burnell Blanks on: 04/24/2022 11:12 AM   Modules accepted: Level of Service

## 2022-04-24 NOTE — Progress Notes (Signed)
Internal Medicine Clinic Attending  I saw and evaluated the patient.  I personally confirmed the key portions of the history and exam documented by Dr. Sridharan and I reviewed pertinent patient test results.  The assessment, diagnosis, and plan were formulated together and I agree with the documentation in the resident's note.  

## 2022-05-06 ENCOUNTER — Other Ambulatory Visit (HOSPITAL_COMMUNITY): Payer: Self-pay

## 2022-06-04 ENCOUNTER — Other Ambulatory Visit (HOSPITAL_COMMUNITY): Payer: Self-pay

## 2022-06-14 ENCOUNTER — Other Ambulatory Visit (HOSPITAL_COMMUNITY): Payer: Self-pay

## 2022-06-15 ENCOUNTER — Other Ambulatory Visit (HOSPITAL_COMMUNITY): Payer: Self-pay

## 2022-06-22 ENCOUNTER — Observation Stay (HOSPITAL_COMMUNITY)
Admission: EM | Admit: 2022-06-22 | Discharge: 2022-06-24 | Disposition: A | Discharge status: Home / Self Care | Payer: 59 | Attending: Internal Medicine | Admitting: Internal Medicine

## 2022-06-22 ENCOUNTER — Other Ambulatory Visit: Payer: Self-pay

## 2022-06-22 ENCOUNTER — Emergency Department (HOSPITAL_COMMUNITY): Payer: 59

## 2022-06-22 ENCOUNTER — Encounter (HOSPITAL_COMMUNITY): Payer: Self-pay | Admitting: *Deleted

## 2022-06-22 DIAGNOSIS — R079 Chest pain, unspecified: Secondary | ICD-10-CM | POA: Diagnosis not present

## 2022-06-22 DIAGNOSIS — E785 Hyperlipidemia, unspecified: Secondary | ICD-10-CM | POA: Insufficient documentation

## 2022-06-22 DIAGNOSIS — R7303 Prediabetes: Secondary | ICD-10-CM | POA: Insufficient documentation

## 2022-06-22 DIAGNOSIS — Z7901 Long term (current) use of anticoagulants: Secondary | ICD-10-CM | POA: Insufficient documentation

## 2022-06-22 DIAGNOSIS — Z90710 Acquired absence of both cervix and uterus: Secondary | ICD-10-CM

## 2022-06-22 DIAGNOSIS — Z8249 Family history of ischemic heart disease and other diseases of the circulatory system: Secondary | ICD-10-CM | POA: Diagnosis not present

## 2022-06-22 DIAGNOSIS — J309 Allergic rhinitis, unspecified: Secondary | ICD-10-CM | POA: Diagnosis present

## 2022-06-22 DIAGNOSIS — Z8616 Personal history of COVID-19: Secondary | ICD-10-CM

## 2022-06-22 DIAGNOSIS — I959 Hypotension, unspecified: Secondary | ICD-10-CM | POA: Diagnosis not present

## 2022-06-22 DIAGNOSIS — E876 Hypokalemia: Secondary | ICD-10-CM | POA: Insufficient documentation

## 2022-06-22 DIAGNOSIS — I110 Hypertensive heart disease with heart failure: Secondary | ICD-10-CM | POA: Insufficient documentation

## 2022-06-22 DIAGNOSIS — Z86718 Personal history of other venous thrombosis and embolism: Secondary | ICD-10-CM | POA: Diagnosis not present

## 2022-06-22 DIAGNOSIS — I10 Essential (primary) hypertension: Secondary | ICD-10-CM | POA: Diagnosis not present

## 2022-06-22 DIAGNOSIS — G4733 Obstructive sleep apnea (adult) (pediatric): Secondary | ICD-10-CM | POA: Diagnosis not present

## 2022-06-22 DIAGNOSIS — Z86711 Personal history of pulmonary embolism: Secondary | ICD-10-CM | POA: Diagnosis not present

## 2022-06-22 DIAGNOSIS — Z9049 Acquired absence of other specified parts of digestive tract: Secondary | ICD-10-CM

## 2022-06-22 DIAGNOSIS — N179 Acute kidney failure, unspecified: Secondary | ICD-10-CM | POA: Insufficient documentation

## 2022-06-22 DIAGNOSIS — I200 Unstable angina: Secondary | ICD-10-CM | POA: Diagnosis not present

## 2022-06-22 DIAGNOSIS — R0789 Other chest pain: Secondary | ICD-10-CM | POA: Diagnosis present

## 2022-06-22 DIAGNOSIS — Z79899 Other long term (current) drug therapy: Secondary | ICD-10-CM | POA: Insufficient documentation

## 2022-06-22 DIAGNOSIS — I5032 Chronic diastolic (congestive) heart failure: Secondary | ICD-10-CM | POA: Insufficient documentation

## 2022-06-22 DIAGNOSIS — Z635 Disruption of family by separation and divorce: Secondary | ICD-10-CM

## 2022-06-22 DIAGNOSIS — E669 Obesity, unspecified: Secondary | ICD-10-CM | POA: Diagnosis present

## 2022-06-22 DIAGNOSIS — G4730 Sleep apnea, unspecified: Secondary | ICD-10-CM | POA: Diagnosis present

## 2022-06-22 DIAGNOSIS — Z888 Allergy status to other drugs, medicaments and biological substances status: Secondary | ICD-10-CM

## 2022-06-22 DIAGNOSIS — Z6838 Body mass index (BMI) 38.0-38.9, adult: Secondary | ICD-10-CM

## 2022-06-22 DIAGNOSIS — Z881 Allergy status to other antibiotic agents status: Secondary | ICD-10-CM

## 2022-06-22 LAB — TROPONIN I (HIGH SENSITIVITY)
Troponin I (High Sensitivity): 6 ng/L (ref <18)
Troponin I (High Sensitivity): 6 ng/L (ref <18)

## 2022-06-22 LAB — CBC
HCT: 41.9 % (ref 36.0–46.0)
Hemoglobin: 13.8 g/dL (ref 12.0–15.0)
MCH: 27.4 pg (ref 26.0–34.0)
MCHC: 32.9 g/dL (ref 30.0–36.0)
MCV: 83.1 fL (ref 80.0–100.0)
Platelets: 259 10*3/uL (ref 150–400)
RBC: 5.04 MIL/uL (ref 3.87–5.11)
RDW: 13.9 % (ref 11.5–15.5)
WBC: 8.4 10*3/uL (ref 4.0–10.5)
nRBC: 0 % (ref 0.0–0.2)

## 2022-06-22 LAB — BASIC METABOLIC PANEL
Anion gap: 11 (ref 5–15)
BUN: 14 mg/dL (ref 8–23)
CO2: 28 mmol/L (ref 22–32)
Calcium: 9.4 mg/dL (ref 8.9–10.3)
Chloride: 104 mmol/L (ref 98–111)
Creatinine, Ser: 0.99 mg/dL (ref 0.44–1.00)
GFR, Estimated: >60 mL/min (ref >60)
Glucose, Bld: 101 mg/dL — ABNORMAL HIGH (ref 70–99)
Potassium: 3 mmol/L — ABNORMAL LOW (ref 3.5–5.1)
Sodium: 143 mmol/L (ref 135–145)

## 2022-06-22 LAB — BRAIN NATRIURETIC PEPTIDE: B Natriuretic Peptide: 42.2 pg/mL (ref 0.0–100.0)

## 2022-06-22 NOTE — ED Provider Triage Note (Cosign Needed Addendum)
Emergency Medicine Provider Triage Evaluation Note  [name] , a 66 y.o. female  was evaluated in triage.  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  Patient developed mid chest pain with radiation to her left shoulder.  She states that her coworkers brought her to the back, gave her aspirin.  EMS was called and she received nitroglycerin which seemed to help her symptoms.  She has a history of PE (on Xarelto, compliant) and history of congestive heart failure.  States that pain is now resolved.  Review of Systems  Positive: Chest pain, shortness of breath Negative: Vomiting  Physical Exam  BP 120/80 (BP Location: Right Arm)   Pulse 69   Temp 98.1 F (36.7 C) (Oral)   Resp 16   SpO2 95%  Gen:   Awake, no distress   Resp:  Normal effort  MSK:   Moves extremities without difficulty  Other:  Regular rhythm  Medical Decision Making  Medically screening exam initiated at 5:37 PM.  Appropriate orders placed.  Caroll Rancher was informed that the remainder of the evaluation will be completed by another provider, this initial triage assessment does not replace that evaluation, and the importance of remaining in the ED until their evaluation is complete.     Carlisle Cater, PA-C 06/22/22 1738    Carlisle Cater, PA-C 06/22/22 1739

## 2022-06-22 NOTE — ED Triage Notes (Signed)
The pt is c/o chest pain while working today  with lt shoulder radiation  ems gave the pt a sl nitro and 324mg  of aspirin   no pain in her chest at present

## 2022-06-23 DIAGNOSIS — E785 Hyperlipidemia, unspecified: Secondary | ICD-10-CM | POA: Diagnosis present

## 2022-06-23 DIAGNOSIS — Z8616 Personal history of COVID-19: Secondary | ICD-10-CM | POA: Diagnosis not present

## 2022-06-23 DIAGNOSIS — Z8249 Family history of ischemic heart disease and other diseases of the circulatory system: Secondary | ICD-10-CM | POA: Diagnosis not present

## 2022-06-23 DIAGNOSIS — I200 Unstable angina: Secondary | ICD-10-CM

## 2022-06-23 DIAGNOSIS — N179 Acute kidney failure, unspecified: Secondary | ICD-10-CM | POA: Diagnosis present

## 2022-06-23 DIAGNOSIS — Z9049 Acquired absence of other specified parts of digestive tract: Secondary | ICD-10-CM | POA: Diagnosis not present

## 2022-06-23 DIAGNOSIS — G4730 Sleep apnea, unspecified: Secondary | ICD-10-CM | POA: Diagnosis present

## 2022-06-23 DIAGNOSIS — I5032 Chronic diastolic (congestive) heart failure: Secondary | ICD-10-CM | POA: Diagnosis present

## 2022-06-23 DIAGNOSIS — E876 Hypokalemia: Secondary | ICD-10-CM | POA: Diagnosis present

## 2022-06-23 DIAGNOSIS — J309 Allergic rhinitis, unspecified: Secondary | ICD-10-CM | POA: Diagnosis present

## 2022-06-23 DIAGNOSIS — Z90710 Acquired absence of both cervix and uterus: Secondary | ICD-10-CM | POA: Diagnosis not present

## 2022-06-23 DIAGNOSIS — I110 Hypertensive heart disease with heart failure: Secondary | ICD-10-CM | POA: Diagnosis present

## 2022-06-23 DIAGNOSIS — E669 Obesity, unspecified: Secondary | ICD-10-CM | POA: Diagnosis present

## 2022-06-23 DIAGNOSIS — Z86718 Personal history of other venous thrombosis and embolism: Secondary | ICD-10-CM | POA: Diagnosis not present

## 2022-06-23 DIAGNOSIS — Z888 Allergy status to other drugs, medicaments and biological substances status: Secondary | ICD-10-CM | POA: Diagnosis not present

## 2022-06-23 DIAGNOSIS — R079 Chest pain, unspecified: Secondary | ICD-10-CM | POA: Diagnosis not present

## 2022-06-23 DIAGNOSIS — Z6838 Body mass index (BMI) 38.0-38.9, adult: Secondary | ICD-10-CM | POA: Diagnosis not present

## 2022-06-23 DIAGNOSIS — R0789 Other chest pain: Secondary | ICD-10-CM | POA: Diagnosis present

## 2022-06-23 DIAGNOSIS — Z881 Allergy status to other antibiotic agents status: Secondary | ICD-10-CM | POA: Diagnosis not present

## 2022-06-23 DIAGNOSIS — Z635 Disruption of family by separation and divorce: Secondary | ICD-10-CM | POA: Diagnosis not present

## 2022-06-23 DIAGNOSIS — R7303 Prediabetes: Secondary | ICD-10-CM | POA: Diagnosis present

## 2022-06-23 DIAGNOSIS — Z7901 Long term (current) use of anticoagulants: Secondary | ICD-10-CM | POA: Diagnosis not present

## 2022-06-23 DIAGNOSIS — Z79899 Other long term (current) drug therapy: Secondary | ICD-10-CM | POA: Diagnosis not present

## 2022-06-23 DIAGNOSIS — Z86711 Personal history of pulmonary embolism: Secondary | ICD-10-CM | POA: Diagnosis not present

## 2022-06-23 HISTORY — DX: Other chest pain: R07.89

## 2022-06-23 LAB — LIPID PANEL
Cholesterol: 122 mg/dL (ref 0–200)
HDL: 39 mg/dL — ABNORMAL LOW (ref >40)
LDL Cholesterol: 68 mg/dL (ref 0–99)
Total CHOL/HDL Ratio: 3.1 RATIO
Triglycerides: 75 mg/dL (ref <150)
VLDL: 15 mg/dL (ref 0–40)

## 2022-06-23 LAB — TSH: TSH: 1.119 u[IU]/mL (ref 0.350–4.500)

## 2022-06-23 LAB — BASIC METABOLIC PANEL
Anion gap: 11 (ref 5–15)
BUN: 9 mg/dL (ref 8–23)
CO2: 24 mmol/L (ref 22–32)
Calcium: 9.2 mg/dL (ref 8.9–10.3)
Chloride: 105 mmol/L (ref 98–111)
Creatinine, Ser: 0.86 mg/dL (ref 0.44–1.00)
GFR, Estimated: >60 mL/min (ref >60)
Glucose, Bld: 102 mg/dL — ABNORMAL HIGH (ref 70–99)
Potassium: 3.9 mmol/L (ref 3.5–5.1)
Sodium: 140 mmol/L (ref 135–145)

## 2022-06-23 LAB — APTT
aPTT: 21 seconds — ABNORMAL LOW (ref 24–36)
aPTT: 64 seconds — ABNORMAL HIGH (ref 24–36)
aPTT: 70 seconds — ABNORMAL HIGH (ref 24–36)

## 2022-06-23 LAB — HIV ANTIBODY (ROUTINE TESTING W REFLEX): HIV Screen 4th Generation wRfx: NONREACTIVE

## 2022-06-23 LAB — HEPARIN LEVEL (UNFRACTIONATED)
Heparin Unfractionated: >1.1 IU/mL — ABNORMAL HIGH (ref 0.30–0.70)
Heparin Unfractionated: >1.1 IU/mL — ABNORMAL HIGH (ref 0.30–0.70)

## 2022-06-23 LAB — MAGNESIUM: Magnesium: 2.1 mg/dL (ref 1.7–2.4)

## 2022-06-23 LAB — HEMOGLOBIN A1C
Hgb A1c MFr Bld: 5.9 % — ABNORMAL HIGH (ref 4.8–5.6)
Mean Plasma Glucose: 122.63 mg/dL

## 2022-06-23 MED ORDER — ACETAMINOPHEN 325 MG PO TABS: 650.0000 mg | ORAL_TABLET | Freq: Four times a day (QID) | ORAL | Status: DC | PRN

## 2022-06-23 MED ORDER — CARVEDILOL 6.25 MG PO TABS
6.2500 mg | ORAL_TABLET | Freq: Two times a day (BID) | ORAL | Status: DC
  Administered 2022-06-23 – 2022-06-24 (×4): 6.25 mg via ORAL
  Filled 2022-06-23 (×2): qty 2
  Filled 2022-06-23: qty 1
  Filled 2022-06-23: qty 2

## 2022-06-23 MED ORDER — AMLODIPINE BESYLATE 5 MG PO TABS
5.0000 mg | ORAL_TABLET | Freq: Every day | ORAL | Status: DC
  Administered 2022-06-23 – 2022-06-24 (×2): 5 mg via ORAL
  Filled 2022-06-23 (×2): qty 1

## 2022-06-23 MED ORDER — POTASSIUM CHLORIDE CRYS ER 20 MEQ PO TBCR
60.0000 meq | EXTENDED_RELEASE_TABLET | Freq: Once | ORAL | Status: AC
  Administered 2022-06-23: 60 meq via ORAL
  Filled 2022-06-23: qty 3

## 2022-06-23 MED ORDER — HEPARIN (PORCINE) 25000 UT/250ML-% IV SOLN
1050.0000 [IU]/h | INTRAVENOUS | Status: DC
  Administered 2022-06-23: 900 [IU]/h via INTRAVENOUS
  Filled 2022-06-23 (×2): qty 250

## 2022-06-23 MED ORDER — RIVAROXABAN 10 MG PO TABS: 20.0000 mg | ORAL_TABLET | Freq: Every day | ORAL | Status: DC

## 2022-06-23 MED ORDER — ATORVASTATIN CALCIUM 40 MG PO TABS
40.0000 mg | ORAL_TABLET | Freq: Every day | ORAL | Status: DC
  Administered 2022-06-23: 40 mg via ORAL
  Filled 2022-06-23 (×2): qty 1

## 2022-06-23 MED ORDER — SENNOSIDES-DOCUSATE SODIUM 8.6-50 MG PO TABS: 1.0000 | ORAL_TABLET | Freq: Every evening | ORAL | Status: DC | PRN

## 2022-06-23 MED ORDER — CHLORTHALIDONE 25 MG PO TABS
12.5000 mg | ORAL_TABLET | Freq: Every day | ORAL | Status: DC
  Administered 2022-06-23 – 2022-06-24 (×2): 12.5 mg via ORAL
  Filled 2022-06-23 (×2): qty 0.5

## 2022-06-23 MED ORDER — POTASSIUM CHLORIDE CRYS ER 20 MEQ PO TBCR
40.0000 meq | EXTENDED_RELEASE_TABLET | Freq: Once | ORAL | Status: AC
  Administered 2022-06-23: 40 meq via ORAL
  Filled 2022-06-23: qty 2

## 2022-06-23 MED ORDER — BISACODYL 5 MG PO TBEC: 5.0000 mg | DELAYED_RELEASE_TABLET | Freq: Every day | ORAL | Status: DC | PRN

## 2022-06-23 MED ORDER — ENOXAPARIN SODIUM 40 MG/0.4ML IJ SOSY: 40.0000 mg | PREFILLED_SYRINGE | Freq: Every day | INTRAMUSCULAR | Status: DC

## 2022-06-23 NOTE — Consult Note (Addendum)
Cardiology Consultation   Patient ID: [name] MRN: [id number]; DOB: 1955/11/04  Admit date: 06/22/2022 Date of Consult: 06/23/2022  PCP:  Christiana Fuchs, [place] Prov[facility] Cardiologist:  New to [facility] heart care     Patient Profile:   [name] is a 66 y.o. 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  History of Present Illness:   [name] with above past medical history presented to the ER with complaints of chest pain.  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  She reports associated shortness of breath, diaphoresis.  EMS was called, she was given nitroglycerin and aspirin, which had resolved her pain. Pain lasted 15 minutes. She denied pain now, never had similar symptoms in the past. She states her parents both had MI in the past.  She takes Lasix as needed at home, denied any worsening SOB with exertion, orthopnea, weight gain, leg edema lately.   She is not followed by cardiology at baseline.  She was hospitalized 05/2018 for acute diastolic heart failure.  Echocardiogram from 05/22/2018 revealed LVEF 60 to 65%, grade 1 DD, elevated LV filling pressure, trivial MR.  She was discharged on Lasix 40 mg daily, as well as carvedilol 6.25 mg twice daily and losartan 100 mg daily.  Diagnostic work-up here revealed hypokalemia 3, otherwise unremarkable BMP and CBC.  High sensitive troponin 6>6.  BNP 42.  LDL 68, HDL 39, triglycerides 75.  Chest x-ray revealed mild bibasilar atelectasis.EKG from 06/22/2022 at 1711 revealed sinus rhythm with ventricular rate of 71, T wave flattening and inversion of inferolateral leads, which  is similar to 2022 EKG. she is hemodynamically stable at ED.  She was admitted to hospital with concern of unstable angina.  Cardiology is consulted for further evaluation today.    Past Medical History:  Diagnosis Date   Acute heart failure ([place]) 05/21/2018   Bronchitis due to COVID-19 virus 03/23/2019   DVT (deep venous thrombosis) ([place])    Gastroenteritis due to food toxin 11/02/2020   Hemorrhage of rectum and anus 05/06/2017   Hyperlipidemia    Hypertension    Leg pain    right   PE (pulmonary embolism)    Sleep apnea     Past Surgical History:  Procedure Laterality Date   ABDOMINAL HYSTERECTOMY     APPENDECTOMY     BACK SURGERY     CHOLECYSTECTOMY     Gall Bladder removal   COLONOSCOPY WITH PROPOFOL N/A 05/06/2017   Procedure: COLONOSCOPY WITH PROPOFOL;  Surgeon: Wilford Corner, MD;  Location: [facility];  Service: Endoscopy;  Laterality: N/A;   LAMINOTOMY     right at L4-5 with a disc bulge and superimposed right lateral recess protrusion encroaching on the L5 root   SPINE SURGERY     decompression surgery     Home Medications:  Prior to Admission medications   Medication Sig Start Date End Date Taking? Authorizing Provider  amLODipine (NORVASC) 5 MG tablet Take 1 tablet (5 mg total) by mouth daily. 12/05/21  Yes Masters, Katie, DO  atorvastatin (LIPITOR) 40 MG tablet Take 1 tablet (40 mg total) by mouth  daily. 08/28/21  Yes Masters, Katie, DO  carvedilol (COREG) 6.25 MG tablet Take 1 tablet (6.25 mg total) by mouth 2 (two) times daily with a meal. 02/28/22  Yes Masters, Katie, DO  chlorthalidone (HYGROTON) 25 MG tablet Take 0.5 tablets (12.5 mg total) by mouth daily. 09/27/21  Yes Masters, Katie, DO  fluticasone (FLONASE) 50 MCG/ACT nasal spray Place 1 spray into both nostrils daily. Patient taking differently: Place 1 spray into both nostrils daily as needed for allergies. 10/24/21  Yes Sanjuan Dame, MD  furosemide (LASIX) 20 MG tablet Take 1 tablet (20 mg total) by  mouth daily as needed. Patient taking differently: Take 20 mg by mouth daily as needed for fluid or edema. 10/24/21  Yes Sanjuan Dame, MD  loratadine (CLARITIN) 10 MG tablet Take 10 mg by mouth daily.   Yes [provider]  rivaroxaban (XARELTO) 20 MG TABS tablet Take 1 tablet (20 mg total) by mouth daily with supper. 10/24/21  Yes Sanjuan Dame, MD  cetirizine (ZYRTEC ALLERGY) 10 MG tablet Take 1 tablet (10 mg total) by mouth daily. Patient not taking: Reported on 06/23/2022 12/21/21   Axel Filler, MD  guaiFENesin-dextromethorphan [facility] DM) 100-10 MG/5ML syrup Take 5-10 mLs by mouth every 8 (eight) hours as needed for cough. Patient not taking: Reported on 06/23/2022 06/10/19   Katherine Roan, MD  HYDROcodone-acetaminophen (NORCO/VICODIN) 5-325 MG tablet Take 1 tablet by mouth every 6 (six) hours as needed. Patient not taking: Reported on 06/23/2022 01/11/22   Rayna Sexton, PA-C  omeprazole (PRILOSEC) 20 MG capsule Take 2 capsules (40 mg total) by mouth 2 (two) times daily before a meal. 08/07/21 09/18/21  Idamae Schuller, MD    Inpatient Medications: Scheduled Meds:  amLODipine  5 mg Oral Daily   atorvastatin  40 mg Oral Daily   carvedilol  6.25 mg Oral BID WC   chlorthalidone  12.5 mg Oral Daily   Continuous Infusions:  heparin 900 Units/hr (06/23/22 0553)   PRN Meds: acetaminophen, bisacodyl, senna-docusate  Allergies:    Allergies  Allergen Reactions   Tramadol Nausea And Vomiting   Ciprofloxacin Rash    Social History:   Social History   Socioeconomic History   Marital status: Legally Separated    Spouse name: Not on file   Number of children: Not on file   Years of education: Not on file   Highest education level: Not on file  Occupational History   Not on file  Tobacco Use   Smoking status: Never   Smokeless tobacco: Never  Vaping Use   Vaping Use: Never used  Substance and Sexual Activity   Alcohol use: No   Drug use: Not on  file   Sexual activity: Not Currently  Other Topics Concern   Not on file  Social History Narrative   Not on file   Social Determinants of Health   Financial Resource Strain: Not on file  Food Insecurity: Not on file  Transportation Needs: Not on file  Physical Activity: Not on file  Stress: Not on file  Social Connections: Not on file  Intimate Partner Violence: Not on file    Family History:    Family History  Problem Relation Age of Onset   Hypertension Other    Heart disease Other    Diabetes Other    Coronary artery disease Other      ROS:  Constitutional: Denied fever, chills, malaise, night sweats Eyes: Denied vision change or loss Ears/Nose/Mouth/Throat: Denied ear ache, sore throat,  coughing, sinus pain Cardiovascular: see HPI  Respiratory: see HPI  Gastrointestinal: Denied nausea, vomiting, abdominal pain, diarrhea Genital/Urinary: Denied dysuria, hematuria, urinary frequency/urgency Musculoskeletal: Denied muscle ache, joint pain, weakness Skin: Denied rash, wound Neuro: Denied headache, dizziness, syncope Psych: Denied history of depression/anxiety  Endocrine: Denied history of diabetes   Physical Exam/Data:   Vitals:   06/23/22 0945 06/23/22 1015 06/23/22 1030 06/23/22 1115  BP: 107/73 120/84 (!) 104/55 103/65  Pulse: 62 (!) 59 66 (!) 58  Resp: 16  16   Temp:      TempSrc:      SpO2: 95% 93% 95% 93%  Weight:      Height:       No intake or output data in the 24 hours ending 06/23/22 1213    06/22/2022    5:38 PM 04/16/2022   11:01 AM 01/11/2022    6:19 PM  Last 3 Weights  Weight (lbs) 216 lb 11.4 oz 216 lb 12.8 oz 216 lb 0.8 oz  Weight (kg) 98.3 kg 98.34 kg 98 kg     Body mass index is 38.39 kg/m.    Vitals:  Vitals:   06/23/22 1030 06/23/22 1115  BP: (!) 104/55 103/65  Pulse: 66 (!) 58  Resp: 16   Temp:    SpO2: 95% 93%   General Appearance: In no apparent distress, laying in bed, obese  HEENT: Normocephalic, atraumatic.   Neck: Supple, trachea midline, no JVD Cardiovascular: Regular rate and rhythm, normal S1-S2,  no murmur Respiratory: Resting breathing unlabored, lungs sounds clear to auscultation bilaterally, no use of accessory muscles. On room air.  No wheezes, rales or rhonchi.   Gastrointestinal: Bowel sounds positive, abdomen soft Extremities: Able to move all extremities in bed without difficulty, no edema of BLE Musculoskeletal: Normal muscle bulk and tone Skin: Intact, warm, dry. No rashes or petechiae noted in exposed areas.  Neurologic: Alert, oriented to person, place and time. Fluent speech, no facial droop, no cognitive deficit Psychiatric: Normal affect. Mood is appropriate.     EKG:  The EKG was personally reviewed and demonstrates:    EKG from 06/22/2022 at 1711 sinus rhythm with ventricular rate of 71 bpm, T wave flattening and inversion of inferolateral leads seems similar to 2022 EKG  Telemetry:  Telemetry was personally reviewed and demonstrates:    Sinus rhythm   Relevant CV Studies:  Echo from 05/22/2018  - Left ventricle: The cavity size was normal. Wall thickness was    increased in a pattern of mild LVH. Systolic function was normal.    The estimated ejection fraction was in the range of 60% to 65%.    Doppler parameters are consistent with abnormal left ventricular    relaxation (grade 1 diastolic dysfunction). The E/e&' ratio is    between 8-15, suggesting indeterminate LV filling pressure. The    E/e&' ratio is >15, suggesting elevated LV filling pressure.  - Mitral valve: Mildly thickened leaflets . There was trivial    regurgitation.  - Left atrium: The atrium was normal in size.  - Inferior vena cava: The vessel was normal in size. The    respirophasic diameter changes were in the normal range (>= 50%),    consistent with normal central venous pressure.   Impressions:   - Compared to a prior study in 2012, the LVEF is unchanged at    60-65%, with grade 1 DD,  however, LV filling pressure is    elevated.   Laboratory Data:  High Sensitivity  Troponin:   Recent Labs  Lab 06/22/22 1746 06/22/22 2036  TROPONINIHS 6 6     Chemistry Recent Labs  Lab 06/22/22 1746  NA 143  K 3.0*  CL 104  CO2 28  GLUCOSE 101*  BUN 14  CREATININE 0.99  CALCIUM 9.4  GFRNONAA >60  ANIONGAP 11    No results for input(s): "PROT", "ALBUMIN", "AST", "ALT", "ALKPHOS", "BILITOT" in the last 168 hours. Lipids  Recent Labs  Lab 06/23/22 0606  CHOL 122  TRIG 75  HDL 39*  LDLCALC 68  CHOLHDL 3.1    Hematology Recent Labs  Lab 06/22/22 1746  WBC 8.4  RBC 5.04  HGB 13.8  HCT 41.9  MCV 83.1  MCH 27.4  MCHC 32.9  RDW 13.9  PLT 259   Thyroid No results for input(s): "TSH", "FREET4" in the last 168 hours.  BNP Recent Labs  Lab 06/22/22 1746  BNP 42.2    DDimer No results for input(s): "DDIMER" in the last 168 hours.   Radiology/Studies:  DG Chest 2 View  Result Date: 06/22/2022 CLINICAL DATA:  Chest pain that radiates into the left shoulder. EXAM: CHEST - 2 VIEW COMPARISON:  January 22, 2021 FINDINGS: The heart size and mediastinal contours are within normal limits. Mild atelectasis is seen within the bilateral lung bases. There is no evidence of a pleural effusion or pneumothorax. Radiopaque surgical clips are seen within the right upper quadrant. Multilevel degenerative changes seen throughout the thoracic spine. IMPRESSION: Mild bibasilar atelectasis. Electronically Signed   By: Virgina Norfolk M.D.   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, associated with shortness of breath, diaphoresis -EKG without acute ST-T change -High sensitive troponin negative x2 6>6 -Risk factor including HTN, HLD, family hx, obesity  -Recommend further ischemic evaluation with CT coronary study -Echocardiogram is ordered, Haleema Vanderheyden follow - Noted lipid, added TSH, A1C - currently  pain free, OK continue heparin gtt   Chronic diastolic heart failure -Noted hospitalization in [id number] for diastolic heart failure -Echocardiogram is pending, Emran Molzahn follow -Takes Lasix 20 mg daily at home as needed -clinically euvolemic at this time  Hypertension -Blood pressure fairly controlled home medicine amlodipine 5 mg daily and carvedilol 6.25 mg twice daily and chlorthalidone 12.5 mg daily -Consider daily potassium supplements while on diuretic given hypokalemia  History of DVT and PE -On Xarelto  Hyperlipidemia -on Lipitor  Risk Assessment/Risk Scores:   For questions or updates, please contact [place] Please consult [email or web address] for contact info under    Signed, Margie Billet, NP  06/23/2022 12:13 PM  I have seen and examined this patient with Margie Billet.  Agree with above, note added to reflect my findings.  She presented to the hospital after an episode of chest pain.  She was working at a [employer] when she developed pain.  Pain was associated with shortness of breath.  Pain was midsternal radiating to her left arm and upper back.  She called EMS who gave her both aspirin and nitroglycerin which relieved the pain.  On presentation emergency room, pain was resolved.  EKG showed sinus rhythm with ST flattening which was chronic.  Troponins negative x3.  Currently laying in bed feeling well.  GEN: Well nourished, well developed, in no acute distress  HEENT: normal  Neck: no JVD, carotid bruits, or masses Cardiac: RRR; no murmurs, rubs, or gallops,no edema  Respiratory:  clear to  auscultation bilaterally, normal work of breathing GI: soft, nontender, nondistended, + BS MS: no deformity or atrophy  Skin: warm and dry Neuro:  Strength and sensation are intact Psych: euthymic mood, full affect   Chest pain: Unclear etiology.  Possibilities include coronary artery disease versus esophageal spasm as it is improved with nitroglycerin.  Troponins are negative.  We  Vivia Rosenburg plan for coronary CTA to further determine coronary anatomy. Chronic diastolic heart failure: Echo pending.  No evidence of volume overload at this time.  Lossie Kalp M. Lovetta Condie MD 06/23/2022 12:36 PM

## 2022-06-23 NOTE — Progress Notes (Signed)
ANTICOAGULATION CONSULT NOTE - Initial Consult  Pharmacy Consult for heparin Indication:  Hx of VTE on Xarelto PTA/Unstable Angina  Allergies  Allergen Reactions   Tramadol Nausea And Vomiting   Ciprofloxacin Rash    Patient Measurements: Height: 5\' 3"  (160 cm) Weight: 98.3 kg (216 lb 11.4 oz) IBW/kg (Calculated) : 52.4 Heparin Dosing Weight: 75.3 kg   Vital Signs: Temp: 98.1 F (36.7 C) (11/05 1324) Temp Source: Oral (11/05 1324) BP: 151/102 (11/05 1500) Pulse Rate: 81 (11/05 1500)  Labs: Recent Labs    06/22/22 1746 06/22/22 2036 06/23/22 0604 06/23/22 1520  HGB 13.8  --   --   --   HCT 41.9  --   --   --   PLT 259  --   --   --   APTT  --   --  21* 64*  HEPARINUNFRC  --   --  >1.10* >1.10*  CREATININE 0.99  --   --  0.86  TROPONINIHS 6 6  --   --     Estimated Creatinine Clearance: 73.9 mL/min (by C-G formula based on SCr of 0.86 mg/dL).   Medical History: Past Medical History:  Diagnosis Date   Acute heart failure ([place]) 05/21/2018   Bronchitis due to COVID-19 virus 03/23/2019   DVT (deep venous thrombosis) ([place])    Gastroenteritis due to food toxin 11/02/2020   Hemorrhage of rectum and anus 05/06/2017   Hyperlipidemia    Hypertension    Leg pain    right   PE (pulmonary embolism)    Sleep apnea     Medications:  Scheduled:   amLODipine  5 mg Oral Daily   atorvastatin  40 mg Oral Daily   carvedilol  6.25 mg Oral BID WC   chlorthalidone  12.5 mg Oral Daily    Assessment: 80 yof with a history of HTN, HLD, EFPE/DVT in 08/2020 . Patient is presenting with sudden onset substernal chest pain with associated nausea, SOB and diaphoresis . Heparin per pharmacy consult placed for hx of VTE on Xarelto PTA/Unstable Angina .   Patient was on Xarelto prior to arrival. Last dose 11/3 ~1900. Utilizing aPTT monitoring due to falsely high anti-Xa level secondary to DOAC use. --Baseline heparin level and aPTT (HL >1.1; aPTT 21)   Started on 900 units/hr IV  heparin. Resulting aPTT level is 64 which is slightly subtherapeutic. Heparin level remains elevated as expected. No issues with infusion or with bleeding per RN.   Hgb 13.8; plt 259  Goal of Therapy:  Heparin level 0.3-0.7 units/ml Heparin level 66-102 units/ml Monitor platelets by anticoagulation protocol: Yes   Plan:  Increase heparin infusion to 1050 units/hr Check anti-Xa and aPTT level in 6 hours and daily while on heparin until correlate Continue to monitor H&H and platelets  Antonietta Jewel, PharmD, [place] Clinical Pharmacist  Phone: [phone] 06/23/2022 4:07 PM  Please check AMION for all [place] phone numbers After 10:00 PM, call [place] [phone]

## 2022-06-23 NOTE — ED Provider Notes (Signed)
[facility] EMERGENCY DEPARTMENT Provider Note  CSN: [id number] Arrival date & time: 06/22/22 1708  Chief Complaint(s) Chest Pain  HPI [name] is a 66 y.o. female with a past medical history listed below including hypertension, hyperlipidemia, obesity, heart failure with preserved ejection fracture who presents to the emergency department with sudden onset substernal chest pain that radiated to the left arm earlier this afternoon with associated nausea, shortness of breath and diaphoresis.  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  EMS was called she was given aspirin and nitroglycerin which completely resolved her pain.  She has been chest pain-free since.   Chest Pain   Past Medical History Past Medical History:  Diagnosis Date   Acute heart failure ([place]) 05/21/2018   Bronchitis due to COVID-19 virus 03/23/2019   DVT (deep venous thrombosis) ([place])    Gastroenteritis due to food toxin 11/02/2020   Hemorrhage of rectum and anus 05/06/2017   Hyperlipidemia    Hypertension    Leg pain    right   PE (pulmonary embolism)    Sleep apnea    Patient Active Problem List   Diagnosis Date Noted   Chest pain 06/23/2022   Trigger finger, left ring finger 01/04/2022   Upper airway cough syndrome 10/25/2021   Encounter for screening involving social determinants of health (SDoH) 09/06/2020   Deep vein thrombosis (DVT) ([place]) 08/22/2020   Breast cancer screening by mammogram 11/22/2019   Chronic cough 04/20/2019   (HFpEF) heart failure with preserved ejection fraction ([place]) 05/29/2018   History of pulmonary embolus (PE) 04/12/2016   Allergic rhinitis 04/12/2016   Healthcare maintenance 04/12/2016   Hyperlipidemia 01/30/2009   Obesity (BMI 30-39.9) 01/30/2009   SLEEP APNEA, OBSTRUCTIVE 01/30/2009   Essential hypertension 01/30/2009   Home Medication(s) Prior to Admission medications   Medication Sig Start Date End Date  Taking? Authorizing Provider  amLODipine (NORVASC) 5 MG tablet Take 1 tablet (5 mg total) by mouth daily. 12/05/21  Yes Masters, Katie, DO  atorvastatin (LIPITOR) 40 MG tablet Take 1 tablet (40 mg total) by mouth daily. 08/28/21  Yes Masters, Katie, DO  carvedilol (COREG) 6.25 MG tablet Take 1 tablet (6.25 mg total) by mouth 2 (two) times daily with a meal. 02/28/22  Yes Masters, Katie, DO  chlorthalidone (HYGROTON) 25 MG tablet Take 0.5 tablets (12.5 mg total) by mouth daily. 09/27/21  Yes Masters, Katie, DO  fluticasone (FLONASE) 50 MCG/ACT nasal spray Place 1 spray into both nostrils daily. Patient taking differently: Place 1 spray into both nostrils daily as needed for allergies. 10/24/21  Yes Sanjuan Dame, MD  furosemide (LASIX) 20 MG tablet Take 1 tablet (20 mg total) by mouth daily as needed. Patient taking differently: Take 20 mg by mouth daily as needed for fluid or edema. 10/24/21  Yes Sanjuan Dame, MD  loratadine (CLARITIN) 10 MG tablet Take 10 mg by mouth daily.   Yes [provider]  rivaroxaban (XARELTO) 20 MG TABS tablet Take 1 tablet (20 mg total) by mouth daily with supper. 10/24/21  Yes Sanjuan Dame, MD  cetirizine (ZYRTEC ALLERGY) 10 MG tablet Take 1 tablet (10 mg total) by mouth daily. Patient not taking: Reported on 06/23/2022 12/21/21   Axel Filler, MD  guaiFENesin-dextromethorphan [facility] DM) 100-10 MG/5ML syrup Take 5-10 mLs by mouth every 8 (eight) hours as needed for cough. Patient not taking: Reported on 06/23/2022 06/10/19   Katherine Roan, MD  HYDROcodone-acetaminophen (NORCO/VICODIN)  5-325 MG tablet Take 1 tablet by mouth every 6 (six) hours as needed. Patient not taking: Reported on 06/23/2022 01/11/22   Rayna Sexton, PA-C  omeprazole (PRILOSEC) 20 MG capsule Take 2 capsules (40 mg total) by mouth 2 (two) times daily before a meal. 08/07/21 09/18/21  Idamae Schuller, MD                                                                                                                                     Allergies Tramadol and Ciprofloxacin  Review of Systems Review of Systems  Cardiovascular:  Positive for chest pain.   As noted in HPI  Physical Exam Vital Signs  I have reviewed the triage vital signs BP (!) 143/82   Pulse 61   Temp 97.8 F (36.6 C)   Resp 15   Ht 5\' 3"  (1.6 m)   Wt 98.3 kg   SpO2 97%   BMI 38.39 kg/m   Physical Exam Vitals reviewed.  Constitutional:      General: She is not in acute distress.    Appearance: She is well-developed. She is not diaphoretic.  HENT:     Head: Normocephalic and atraumatic.     Nose: Nose normal.  Eyes:     General: No scleral icterus.       Right eye: No discharge.        Left eye: No discharge.     Conjunctiva/sclera: Conjunctivae normal.     Pupils: Pupils are equal, round, and reactive to light.  Cardiovascular:     Rate and Rhythm: Normal rate and regular rhythm.     Heart sounds: No murmur heard.    No friction rub. No gallop.  Pulmonary:     Effort: Pulmonary effort is normal. No respiratory distress.     Breath sounds: Normal breath sounds. No stridor. No rales.  Abdominal:     General: There is no distension.     Palpations: Abdomen is soft.     Tenderness: There is no abdominal tenderness.  Musculoskeletal:        General: No tenderness.     Cervical back: Normal range of motion and neck supple.  Skin:    General: Skin is warm and dry.     Findings: No erythema or rash.  Neurological:     Mental Status: She is alert and oriented to person, place, and time.     ED Results and Treatments Labs (all labs ordered are listed, but only abnormal results are displayed) Labs Reviewed  BASIC METABOLIC PANEL - Abnormal; Notable for the following components:      Result Value   Potassium 3.0 (*)    Glucose, Bld 101 (*)    All other components within normal limits  CBC  BRAIN NATRIURETIC PEPTIDE  HIV ANTIBODY (ROUTINE TESTING W REFLEX)  TROPONIN I  (HIGH SENSITIVITY)  TROPONIN I (HIGH SENSITIVITY)  EKG  EKG Interpretation  Date/Time:  Sunday June 23 2022 01:38:06 EST Ventricular Rate:  61 PR Interval:  175 QRS Duration: 83 QT Interval:  470 QTC Calculation: 474 R Axis:   63 Text Interpretation: Sinus rhythm Borderline T abnormalities, diffuse leads No significant change was found Confirmed by Addison Lank [phone]) on 06/23/2022 2:09:36 AM       Radiology DG Chest 2 View  Result Date: 06/22/2022 CLINICAL DATA:  Chest pain that radiates into the left shoulder. EXAM: CHEST - 2 VIEW COMPARISON:  January 22, 2021 FINDINGS: The heart size and mediastinal contours are within normal limits. Mild atelectasis is seen within the bilateral lung bases. There is no evidence of a pleural effusion or pneumothorax. Radiopaque surgical clips are seen within the right upper quadrant. Multilevel degenerative changes seen throughout the thoracic spine. IMPRESSION: Mild bibasilar atelectasis. Electronically Signed   By: Virgina Norfolk M.D.   On: 06/22/2022 18:46    Medications Ordered in ED Medications  enoxaparin (LOVENOX) injection 40 mg (has no administration in time range)  bisacodyl (DULCOLAX) EC tablet 5 mg (has no administration in time range)  senna-docusate (Senokot-S) tablet 1 tablet (has no administration in time range)  potassium chloride SA (KLOR-CON M) CR tablet 40 mEq (has no administration in time range)                                                                                                                                     Procedures Procedures  (including critical care time)  Medical Decision Making / ED Course   Medical Decision Making Amount and/or Complexity of Data Reviewed External Data Reviewed: notes.    Details: ECHO last EF 60-65%  Labs: ordered. Decision-making details documented in  ED Course. Radiology: ordered and independent interpretation performed. Decision-making details documented in ED Course. ECG/medicine tests: ordered and independent interpretation performed. Decision-making details documented in ED Course.  Risk Decision regarding hospitalization.    Chest pain concerning for ACS. EKG without acute ischemic changes or evidence of pericarditis. Initial troponin negative Delta troponin negative Heart score of 6. Patient is chest pain-free at this time but will require admission for ACS rule out and likely stress testing.  Presentation is not classic for aortic dissection or esophageal perforation. Low suspicion for pulmonary embolism.  She does have a history of it but has been compliant with her Xarelto.  She is chest pain-free and not hypoxic.  Labs also notable for hypokalemia.  We will replete.  No other electrolyte derangements or renal sufficiency.  CBC without leukocytosis or anemia.  Chest x-ray without evidence of pneumonia, pneumothorax or pulmonary edema.      Final Clinical Impression(s) / ED Diagnoses Final diagnoses:  Unstable angina ([place])           This chart was dictated using voice recognition software.  Despite best efforts to proofread,  errors can occur which can change the  documentation meaning.    Fatima Blank, MD 06/23/22 [phone]

## 2022-06-23 NOTE — Progress Notes (Signed)
   HD#0 SUBJECTIVE:  Patient Summary: [name] is a 66 y.o. with a pertinent PMH of HTN, HLD, HFpEF, who presented with sudden-onset chest pain and admitted for unstable angina.   Overnight Events: None  Interim History: [name] reports resolution of her chest pain and feels okay at this time. She denies indigestion, nausea, diaphoresis.  OBJECTIVE:  Vital Signs: Vitals:   06/23/22 1200 06/23/22 1215 06/23/22 1245 06/23/22 1324  BP: 109/65 110/74 106/64   Pulse: 61 67 71   Resp:  16 16   Temp:    98.1 F (36.7 C)  TempSrc:    Oral  SpO2: 94% 97% 95%   Weight:      Height:       Supplemental O2: Room Air SpO2: 95 %  Filed Weights   06/22/22 1738  Weight: 98.3 kg    No intake or output data in the 24 hours ending 06/23/22 1336 Net IO Since Admission: No IO data has been entered for this period [06/23/22 1336]  Physical Exam: Constitutional:Resting comfortably, in no acute distress. Cardio:Regular rate and rhythm. No murmurs, rubs, or gallops. No JVD appreciated. Pulm:Clear to auscultation bilaterally. Normal work of breathing on room air. Abdomen:Soft, nontender, nondistended.  HUT:MLYYTKPT for extremity edema. Skin:Warm and dry. Neuro:Alert and oriented x3. No focal deficit noted. Psych:Pleasant mood and affect.  Patient Lines/Drains/Airways Status     Active Line/Drains/Airways     Name Placement date Placement time Site Days   Peripheral IV 06/22/22 18 G Left Antecubital 06/22/22  1730  Antecubital  1             ASSESSMENT/PLAN:  Assessment: Principal Problem:   Chest pain   Plan: #Unstable angina #HLD Lipid panel with LDL at goal, 68. Patient is currently asymptomatic.  -Cardiology consulted, appreciate their recommendations  -CTA coronary  -F/u echocardiogram  -F/u TSH, HbA1c  -Continue heparin -Continue home atorvastatin 40 mg daily -Consider CTA for PE rule out if recurrent chest pain -Telemetry  #Hypokalemia -Follow  up BMP. May need potassium supplementation given anti-hypertensive regimen  #History of PE and DVT Managed outpatient with Xarelto. -Holding Xarelto given active heparin therapy.  #HFpEF Euvolemic on exam. Home regimen includes lasix PRN.  #HTN Normotenstive at this time. Home regimen includes amlodipine, carvedilol, chlorthalidone, lasix PRN. -Continue home amlodipine 5 mg daily, carvedilol 6.25 mg BID, chlorthalidone 12.5 mg daily   Best Practice: Diet: Cardiac diet IVF: None VTE: Heparin Code: Full AB: None DISPO: Anticipated discharge pending Medical stability.  Signature: Farrel Gordon, D.O.  Internal Medicine Resident, PGY-1 [facility]  Pager: [phone] 1:36 PM, 06/23/2022   Please contact the on call pager after 5 pm and on weekends at [phone].

## 2022-06-23 NOTE — H&P (Signed)
Date: 06/23/2022               Patient Name:  [name] MRN: [id number]  DOB: 10-05-1955 Age / Sex: 66 y.o., female   PCP: Christiana Fuchs, DO         Medical Service: Internal Medicine Teaching Service         Attending Physician: Dr. Aldine Contes, MD    First Contact: Linward Natal Pager: [phone]  Second Contact: Farrel Gordon, DO Pager: ED [phone]       After Hours (After 5p/  First Contact Pager: [phone]  weekends / holidays): Second Contact Pager: [phone]   SUBJECTIVE   Chief Complaint: chest pain  History of Present Illness:  Patient is 66 year old female with hx of HTN, HLD, HFpEF who presented with complaints of sudden onset substernal chest pain/pressure that radiated to left arm. She had associated nausea, diaphoresis, and shortness of breath. 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 Pain started around 4PM. She called EMS shortly after pain began and was given NTG and ASA. Her pain resolved with NTG administration. She has been asymptomatic since. She reports compliance with home medications and states that she has not missed any dose other than PM meds since she has been in ED. No LEE, cough, or syncope reported.     Meds:  Current Meds  Medication Sig   amLODipine (NORVASC) 5 MG tablet Take 1 tablet (5 mg total) by mouth daily.   atorvastatin (LIPITOR) 40 MG tablet Take 1 tablet (40 mg total) by mouth daily.   carvedilol (COREG) 6.25 MG tablet Take 1 tablet (6.25 mg total) by mouth 2 (two) times daily with a meal.   chlorthalidone (HYGROTON) 25 MG tablet Take 0.5 tablets (12.5 mg total) by mouth daily.   fluticasone (FLONASE) 50 MCG/ACT nasal spray Place 1 spray into both nostrils daily. (Patient taking differently: Place 1 spray into both nostrils daily as needed for allergies.)   furosemide (LASIX) 20 MG tablet Take 1 tablet (20 mg total) by mouth daily as needed. (Patient taking differently: Take 20 mg by mouth daily as needed for  fluid or edema.)   loratadine (CLARITIN) 10 MG tablet Take 10 mg by mouth daily.   rivaroxaban (XARELTO) 20 MG TABS tablet Take 1 tablet (20 mg total) by mouth daily with supper.    Past Medical History:  Diagnosis Date   Acute heart failure ([place]) 05/21/2018   Bronchitis due to COVID-19 virus 03/23/2019   DVT (deep venous thrombosis) ([place])    Gastroenteritis due to food toxin 11/02/2020   Hemorrhage of rectum and anus 05/06/2017   Hyperlipidemia    Hypertension    Leg pain    right   PE (pulmonary embolism)    Sleep apnea     Past Surgical History:  Procedure Laterality Date   ABDOMINAL HYSTERECTOMY     APPENDECTOMY     BACK SURGERY     CHOLECYSTECTOMY     Gall Bladder removal   COLONOSCOPY WITH PROPOFOL N/A 05/06/2017   Procedure: COLONOSCOPY WITH PROPOFOL;  Surgeon: Wilford Corner, MD;  Location: [facility] END[facility];  Service: Endoscopy;  Laterality: N/A;   LAMINOTOMY     right at L4-5 with a disc bulge and superimposed right lateral recess protrusion encroaching on the L5 root   SPINE SURGERY     decompression surgery    Social:  Lives With: daughter and daughter's family Occupation: works at [employer] Support: family Level of  Function: independent PCP: Rudene Christians, DO Substances: none  Family History:  Family History  Problem Relation Age of Onset   Hypertension Other    Heart disease Other    Diabetes Other    Coronary artery disease Other     Allergies: Allergies as of 06/22/2022 - Review Complete 06/22/2022  Allergen Reaction Noted   Tramadol Nausea And Vomiting 05/21/2018   Ciprofloxacin Rash 06/07/2011    Review of Systems: A complete ROS was negative except as per HPI.   OBJECTIVE:   Physical Exam: Blood pressure (!) 143/82, pulse 61, temperature 97.8 F (36.6 C), resp. rate 15, height 5\' 3"  (1.6 m), weight 98.3 kg, SpO2 97 %.  Constitutional: well-appearing female sitting in bed, in no acute distress HENT: normocephalic atraumatic, mucous  membranes moist Eyes: conjunctiva non-erythematous Neck: supple Cardiovascular: regular rate and rhythm, no m/r/g Pulmonary/Chest: normal work of breathing on room air, lungs clear to auscultation bilaterally Abdominal: soft, non-tender, non-distended MSK: normal bulk and tone Neurological: alert & oriented  Skin: warm and dry, many nevi Psych: mood and affect appropriate  Labs: CBC    Component Value Date/Time   WBC 8.4 06/22/2022 1746   RBC 5.04 06/22/2022 1746   HGB 13.8 06/22/2022 1746   HGB 13.8 04/12/2016 1533   HGB 11.9 02/18/2011 1519   HCT 41.9 06/22/2022 1746   HCT 42.4 04/12/2016 1533   HCT 36.0 02/18/2011 1519   PLT 259 06/22/2022 1746   PLT 283 04/12/2016 1533   MCV 83.1 06/22/2022 1746   MCV 82 04/12/2016 1533   MCV 83.0 02/18/2011 1519   MCH 27.4 06/22/2022 1746   MCHC 32.9 06/22/2022 1746   RDW 13.9 06/22/2022 1746   RDW 14.1 04/12/2016 1533   RDW 14.5 02/18/2011 1519   LYMPHSABS 1.8 01/22/2021 2024   LYMPHSABS 2.0 02/18/2011 1519   MONOABS 1.1 (H) 01/22/2021 2024   MONOABS 0.8 02/18/2011 1519   EOSABS 0.3 01/22/2021 2024   EOSABS 0.2 02/18/2011 1519   BASOSABS 0.0 01/22/2021 2024   BASOSABS 0.0 02/18/2011 1519     CMP     Component Value Date/Time   NA 143 06/22/2022 1746   NA 144 04/16/2022 1158   K 3.0 (L) 06/22/2022 1746   CL 104 06/22/2022 1746   CO2 28 06/22/2022 1746   GLUCOSE 101 (H) 06/22/2022 1746   BUN 14 06/22/2022 1746   BUN 12 04/16/2022 1158   CREATININE 0.99 06/22/2022 1746   CALCIUM 9.4 06/22/2022 1746   PROT 7.2 01/22/2021 2024   PROT 6.8 06/29/2018 1416   ALBUMIN 3.7 01/22/2021 2024   ALBUMIN 4.3 06/29/2018 1416   AST 21 01/22/2021 2024   ALT 17 01/22/2021 2024   ALKPHOS 124 01/22/2021 2024   BILITOT 0.6 01/22/2021 2024   BILITOT 0.7 06/29/2018 1416   GFRNONAA >60 06/22/2022 1746   GFRAA 71 08/21/2020 1427    Imaging: DG Chest 2 View  Result Date: 06/22/2022 CLINICAL DATA:  Chest pain that radiates into the  left shoulder. EXAM: CHEST - 2 VIEW COMPARISON:  January 22, 2021 FINDINGS: The heart size and mediastinal contours are within normal limits. Mild atelectasis is seen within the bilateral lung bases. There is no evidence of a pleural effusion or pneumothorax. Radiopaque surgical clips are seen within the right upper quadrant. Multilevel degenerative changes seen throughout the thoracic spine. IMPRESSION: Mild bibasilar atelectasis. Electronically Signed   By: January 24, 2021 M.D.   On: 06/22/2022 18:46    EKG: personally reviewed my interpretation  is NSR, unchanged from prior   ASSESSMENT & PLAN:    Assessment & Plan by Problem: Principal Problem:   Chest pain   MARSHEA WISHER is a 67 y.o. with pertinent PMH of HFpEF, HTN, HLD,  who presented with chest pain and admitted for acs rule out on hospital day 0  #Unstable Angina Patient story concerning for unstable angina. Fortunately, troponin has been flat and negative, no concerning EKG findings noted, and symptoms have not returned. Patient would likely benfit from cardiology evaluation for possible cardiac angiography to further investigate this chest pain. She has heart score of 5 indication 12-16% chance mortality next 30 days. She takes Xarelto for hx PE and DVT. She will be kept NPO for now and started on heparin per pharmacy.  - telemetry - ECHO - heparin per pharm - repeat lipid profile - consider cardiology consult  #hypokalemia 3.0 on initial BMP - continue to monitor and replete as necessary - check mag - daily BMP  # Hx DVT and PE Takes chronic xarelto. Xarelto currently being held in favor of Heparin pending possible procedure. - continue heparin  HFpEF Takes lasix 20mg  PRN. Appears euvolemic at this time.   # HTN Blood pressure currently stable - started on home amlodipine 5, coreg 6.25, chlorthalidone 12.5  # HLD - statin  Diet: Heart Healthy VTE: Heparin IVF: None,None Code: Full Prior to Admission Living  Arrangement: Home, living with daughter Anticipated Discharge Location: Home Barriers to Discharge: medical stability Dispo: Admit patient to Observation with expected length of stay less than 2 midnights.  Signed: , MD

## 2022-06-23 NOTE — Hospital Course (Addendum)
Unstable angina HLD Ms. Squibb presented to [facility] ED with sudden onset chest pain with radiation to her left arm and back and associated diaphoresis. She received ASA and nitroglycerin by EMS and has not had recurrence of symptoms since admission. EKG did not show acute ischemic changes, troponins were within normal limits, and chest x-ray was negative for acute pathology. She was started on heparin drip for possible unstable angina. An echocardiogram was obtained which showed LV EF 55-60% with normal function, no regional wall motion abnormalities, and mild LVH; normal RV function; normal pulmonary artery systolic pressure. Cardiology was consulted and checked TSH which was normal at 1.119, lipid panel which showed LDL at goal at 68, and HbA1c in pre-diabetic range at 5.9%. They further recommended further evaluation with coronary CTA showing a coronary calcium score of 0, normal coronary origin with right dominance, and no radiologic evidence of CAD. There was no recurrence of symptoms prior to discharge. She was continued on home atorvastatin through admission.   Acute kidney injury Serum creatine trended 0.99>086>1.18 with GFR >60 > >60 > 52, concerning for mild AKI. She was given LR 500cc bolus prior to receiving contrast for coronary CTA.   Hypokalemia, resolved Initial potassium level of 3.0, which responded to supplementation. Potassium WNL at time of discharge.    History of PE and DVT Home Xarelto was held in setting of heparin infusion.   HFpEF Patient with known HFpEF, last LV EF of 60-65% consistent with grade 1 diastolic dysfunction and elevated LV filling pressure in 2019. She was euvolemic throughout admission. An echocardiogram was obtained which showed LV EF 55-60% with normal function, no regional wall motion abnormalities, and mild LVH; normal RV function; normal pulmonary artery systolic pressure.    HTN BP managed with home amlodipine, carvedilol, and  chlorthalidone.  Pre-diabetes HbA1c 5.9%.

## 2022-06-23 NOTE — Progress Notes (Addendum)
ANTICOAGULATION CONSULT NOTE - Initial Consult  Pharmacy Consult for heparin Indication: VTE prophylaxis  Allergies  Allergen Reactions   Tramadol Nausea And Vomiting   Ciprofloxacin Rash    Patient Measurements: Height: 5\' 3"  (160 cm) Weight: 98.3 kg (216 lb 11.4 oz) IBW/kg (Calculated) : 52.4 Heparin Dosing Weight: 75 Kg  Vital Signs: Temp: 97.8 F (36.6 C) (11/04 2351) Temp Source: Oral (11/04 1912) BP: 143/82 (11/05 0300) Pulse Rate: 61 (11/05 0300)  Labs: Recent Labs    06/22/22 1746 06/22/22 2036  HGB 13.8  --   HCT 41.9  --   PLT 259  --   CREATININE 0.99  --   TROPONINIHS 6 6    Estimated Creatinine Clearance: 64.2 mL/min (by C-G formula based on SCr of 0.99 mg/dL).   Medical History: Past Medical History:  Diagnosis Date   Acute heart failure ([place]) 05/21/2018   Bronchitis due to COVID-19 virus 03/23/2019   DVT (deep venous thrombosis) ([facility])    Gastroenteritis due to food toxin 11/02/2020   Hemorrhage of rectum and anus 05/06/2017   Hyperlipidemia    Hypertension    Leg pain    right   PE (pulmonary embolism)    Sleep apnea     Assessment: 66 y.o. female who presents to the emergency department with sudden onset substernal chest pain with associated nausea, SOB and diaphoresis. Patient on Xarelto (last dose 11/3 ~1900) prior to admission for history of PE/DVT in 08/2020. CBC WNL, Baseline coags ordered now. Pharmacy consulted to start heparin.   Goal of Therapy:  Heparin level 0.3-0.7 units/ml aPTT: 66-102 seconds Monitor platelets by anticoagulation protocol: Yes   Plan:  Start heparin infusion at 900 units/hr STAT heparin level and aPTT Check anti-Xa level in 8 hours and daily while on heparin Continue to monitor H&H and platelets  Georga Bora, PharmD Clinical Pharmacist 06/23/2022 4:36 AM Please check AMION for all [place] numbers

## 2022-06-24 ENCOUNTER — Inpatient Hospital Stay (HOSPITAL_COMMUNITY): Payer: 59

## 2022-06-24 DIAGNOSIS — R079 Chest pain, unspecified: Secondary | ICD-10-CM | POA: Diagnosis not present

## 2022-06-24 DIAGNOSIS — I200 Unstable angina: Secondary | ICD-10-CM | POA: Diagnosis not present

## 2022-06-24 LAB — ECHOCARDIOGRAM COMPLETE
Area-P 1/2: 3.53 cm2
Calc EF: 50.4 %
Height: 63 in
S' Lateral: 3 cm
Single Plane A2C EF: 40.4 %
Single Plane A4C EF: 60.9 %
Weight: 3467.39 oz

## 2022-06-24 LAB — HEPARIN LEVEL (UNFRACTIONATED): Heparin Unfractionated: 0.73 IU/mL — ABNORMAL HIGH (ref 0.30–0.70)

## 2022-06-24 LAB — CBC
HCT: 40.6 % (ref 36.0–46.0)
Hemoglobin: 13 g/dL (ref 12.0–15.0)
MCH: 27.2 pg (ref 26.0–34.0)
MCHC: 32 g/dL (ref 30.0–36.0)
MCV: 84.9 fL (ref 80.0–100.0)
Platelets: 228 10*3/uL (ref 150–400)
RBC: 4.78 MIL/uL (ref 3.87–5.11)
RDW: 14.1 % (ref 11.5–15.5)
WBC: 8 10*3/uL (ref 4.0–10.5)
nRBC: 0 % (ref 0.0–0.2)

## 2022-06-24 LAB — BASIC METABOLIC PANEL
Anion gap: 10 (ref 5–15)
BUN: 14 mg/dL (ref 8–23)
CO2: 22 mmol/L (ref 22–32)
Calcium: 8.9 mg/dL (ref 8.9–10.3)
Chloride: 107 mmol/L (ref 98–111)
Creatinine, Ser: 1.18 mg/dL — ABNORMAL HIGH (ref 0.44–1.00)
GFR, Estimated: 52 mL/min — ABNORMAL LOW (ref >60)
Glucose, Bld: 83 mg/dL (ref 70–99)
Potassium: 3.8 mmol/L (ref 3.5–5.1)
Sodium: 139 mmol/L (ref 135–145)

## 2022-06-24 LAB — APTT: aPTT: 67 seconds — ABNORMAL HIGH (ref 24–36)

## 2022-06-24 MED ORDER — METOPROLOL TARTRATE 5 MG/5ML IV SOLN
INTRAVENOUS | Status: AC
  Administered 2022-06-24: 5 mg
  Filled 2022-06-24: qty 5

## 2022-06-24 MED ORDER — NITROGLYCERIN 0.4 MG SL SUBL
SUBLINGUAL_TABLET | SUBLINGUAL | Status: AC
  Administered 2022-06-24: 0.8 mg
  Filled 2022-06-24: qty 2

## 2022-06-24 MED ORDER — LACTATED RINGERS IV BOLUS
500.0000 mL | Freq: Once | INTRAVENOUS | Status: AC
  Administered 2022-06-24: 500 mL via INTRAVENOUS

## 2022-06-24 MED ORDER — IOHEXOL 350 MG/ML SOLN
95.0000 mL | Freq: Once | INTRAVENOUS | Status: AC | PRN
  Administered 2022-06-24: 95 mL via INTRAVENOUS

## 2022-06-24 NOTE — Progress Notes (Signed)
   HD#1 SUBJECTIVE:  Patient Summary: [name] is a 66 y.o. with a pertinent PMH of HTN, HLD, HFpEF, hx dvt/pe on xarelto, who presented with sudden-onset chest pain and admitted for unstable angina.   Overnight Events: None  Interim History: Patient feels much better today. She denies chest pain or shortness of breath. States she wears compression stockings typically but does not feel particularly swollen. Says she starting wearing stockings after a foot injury.  OBJECTIVE:  Vital Signs: Vitals:   06/24/22 1015 06/24/22 1049 06/24/22 1100 06/24/22 1145  BP: 109/73  115/73 114/68  Pulse: 70  69 (!) 58  Resp: 16  16 18   Temp:  97.8 F (36.6 C)    TempSrc:  Oral    SpO2: 94%  97% 95%  Weight:      Height:       Supplemental O2: Room Air SpO2: 95 %  Filed Weights   06/22/22 1738  Weight: 98.3 kg    Physical Exam: Constitutional: Resting comfortably, in no acute distress. Cardio: Regular rate and rhythm. No murmurs, rubs, or gallops.  Pulm: Clear to auscultation bilaterally. Normal work of breathing on room air. MSK: Mild indentation at bilateral lower legs from socks. Skin: Warm and dry. Neuro: Alert and oriented x3. No focal deficit noted. Psych: Pleasant mood and affect.  Patient Lines/Drains/Airways Status     Active Line/Drains/Airways     Name Placement date Placement time Site Days   Peripheral IV 06/22/22 18 G Left Antecubital 06/22/22  1730  Antecubital  1             ASSESSMENT/PLAN:  Assessment: Principal Problem:   Chest pain  [name] is a 66 y.o. with a pertinent PMH of HTN, HLD, HFpEF, hx dvt/pe on xarelto, who presented with sudden-onset chest pain diagnosed with unstable angina and admitted for ACS workup.  Plan: #Unstable angina #HLD Presented with chest pain responsive to nitro, SOB, and diaphoresis. Risk factors including HTN, HLD, family hx, obesity. Currently without symptoms. EKG without acute ST-T change, borderline  T wave abnormality. High sensitive troponin negative x2 6>6. Lipid panel with LDL at goal, 68. TSH wnl. A1c 5.9. Patient is currently asymptomatic.  -Cardiology consulted, appreciate their recommendations -CTA coronary pending -echocardiogram pending -Continue heparin gtt -Continue home atorvastatin 40 mg daily -Telemetry  #Hypokalemia Potassium 3.8 today. May need daily supplements given diuretic.  -supplement as needed -daily BMP  #History of PE and DVT Managed outpatient with Xarelto. -Holding Xarelto given active heparin therapy.  #HFpEF ECHO in 2019 with EF 60-65%, grade 1 DD but elevated LV filling pressure.  No concerns for CHF exacerbation this admission. Euvolemic on exam. Lungs clear, without significant lower leg edema. Home regimen includes lasix 20 mg PRN. -no need for diuresis. -repeat echo pending  #HTN Normotenstive. Home regimen includes amlodipine, carvedilol, chlorthalidone, lasix PRN. -amlodipine 5 mg daily -carvedilol 6.25 mg BID -chlorthalidone 12.5 mg daily   Best Practice: Diet: Cardiac diet IVF: None VTE: Heparin Code: Full AB: None DISPO: Anticipated discharge pending Medical stability.  Signature: Linward Natal, MD Internal Medicine Resident, PGY-1 [facility]  12:16 PM, 06/24/2022   Please contact the on call pager after 5 pm and on weekends at [phone].

## 2022-06-24 NOTE — Discharge Summary (Signed)
Name: [name] MRN: [id number] DOB: [birth year] 66 y.o. PCP: Christiana Fuchs, DO  Date of Admission: 06/22/2022  5:21 PM Date of Discharge:  06/24/2022 Attending Physician: Dr.  Saverio Danker  DISCHARGE DIAGNOSIS:  Primary Problem: Chest pain   Hospital Problems: Principal Problem:   Chest pain    DISCHARGE MEDICATIONS:   Allergies as of 06/24/2022       Reactions   Tramadol Nausea And Vomiting   Ciprofloxacin Rash        Medication List     STOP taking these medications    cetirizine 10 MG tablet Commonly known as: ZyrTEC Allergy   guaiFENesin-dextromethorphan 100-10 MG/5ML syrup Commonly known as: ROBITUSSIN DM   HYDROcodone-acetaminophen 5-325 MG tablet Commonly known as: NORCO/VICODIN       TAKE these medications    amLODipine 5 MG tablet Commonly known as: NORVASC Take 1 tablet (5 mg total) by mouth daily.   atorvastatin 40 MG tablet Commonly known as: LIPITOR Take 1 tablet (40 mg total) by mouth daily.   carvedilol 6.25 MG tablet Commonly known as: COREG Take 1 tablet (6.25 mg total) by mouth 2 (two) times daily with a meal.   chlorthalidone 25 MG tablet Commonly known as: HYGROTON Take 0.5 tablets (12.5 mg total) by mouth daily.   fluticasone 50 MCG/ACT nasal spray Commonly known as: FLONASE Place 1 spray into both nostrils daily. What changed:  when to take this reasons to take this   furosemide 20 MG tablet Commonly known as: LASIX Take 1 tablet (20 mg total) by mouth daily as needed. What changed: reasons to take this   loratadine 10 MG tablet Commonly known as: CLARITIN Take 10 mg by mouth daily.   omeprazole 20 MG capsule Commonly known as: PRILOSEC Take 2 capsules (40 mg total) by mouth 2 (two) times daily before a meal.   Xarelto 20 MG Tabs tablet Generic drug: rivaroxaban Take 1 tablet (20 mg total) by mouth daily with supper.        DISPOSITION AND FOLLOW-UP:  [name] was discharged from [facility] in Stable condition. At the hospital follow up visit please address:  Unstable angina HLD Pre-diabetes Please ensure patient has been able to schedule an appointment for cardiology follow-up. Counsel on lifestyle modifications to continue keeping cholesterol at goal and improve glycemic control.   Acute kidney injury Hypokalemia, resolved Recheck BMP.  Follow-up Recommendations: Consults: Cardiology Labs: Basic Metabolic Profile Studies: None Medications: No medication changes at time of discharge.  Follow-up Appointments:  Follow-up Information     Masters, Joellen Jersey, DO Follow up.   Specialty: Internal Medicine Why: Our clinic will call you to schedule this appointment. Contact information: [place] [address] [place] [facility] [place] Follow up.   Why: The clinic will call you to schedule this appointment. Contact information: [place] [phone] [place] COURSE:  Patient Summary: Unstable angina HLD [name] presented to [facility] ED with sudden onset chest pain with radiation to her left arm and back and associated diaphoresis. She received ASA and nitroglycerin by EMS and has not had recurrence of symptoms since admission. EKG did not show acute ischemic changes, troponins were within normal limits, and chest x-ray was negative for acute pathology. She was started  on heparin drip for possible unstable angina. An echocardiogram was obtained which showed LV EF 55-60% with normal function, no regional wall motion abnormalities, and mild LVH; normal RV function; normal pulmonary artery systolic pressure. Cardiology was consulted and checked TSH which was normal at 1.119, lipid panel which showed LDL at goal at 68, and HbA1c in pre-diabetic range at 5.9%. They further recommended further evaluation with  coronary CTA showing a coronary calcium score of 0, normal coronary origin with right dominance, and no radiologic evidence of CAD. There was no recurrence of symptoms prior to discharge. She was continued on home atorvastatin through admission.   Acute kidney injury Serum creatine trended 0.99>086>1.18 with GFR >60 > >60 > 52, concerning for mild AKI. She was given LR 500cc bolus prior to receiving contrast for coronary CTA.   Hypokalemia, resolved Initial potassium level of 3.0, which responded to supplementation. Potassium WNL at time of discharge.    History of PE and DVT Home Xarelto was held in setting of heparin infusion.   HFpEF Patient with known HFpEF, last LV EF of 60-65% consistent with grade 1 diastolic dysfunction and elevated LV filling pressure in 2019. She was euvolemic throughout admission. An echocardiogram was obtained which showed LV EF 55-60% with normal function, no regional wall motion abnormalities, and mild LVH; normal RV function; normal pulmonary artery systolic pressure.    HTN BP managed with home amlodipine, carvedilol, and chlorthalidone.  Pre-diabetes HbA1c 5.9%.   DISCHARGE INSTRUCTIONS:   Discharge Instructions     Diet - low sodium heart healthy   Complete by: As directed    Discharge instructions   Complete by: As directed    [name],  It was a pleasure to care for you during your stay at [facility]. You were admitted to the hospital because you had chest pain and our tests did not show any cardiac reasons for the pain. We will have you follow up with the Internal Medicine Center in the next 1-2 weeks, and with the Cardiology clinic. Both clinics will contact you to schedule these visits.  My best, Dr. Marlou Sa   Increase activity slowly   Complete by: As directed        SUBJECTIVE:  [name] states that she feels much better today. She denies recurrence of chest pain or shortness of breath. When asked about the compression socks  she is wearing, she explains that she wears regularly after a foot injury but does not notice extremity edema or wear them for this reason.   Discharge Vitals:   BP 114/77 (BP Location: Left Arm)   Pulse 68   Temp 98.2 F (36.8 C) (Oral)   Resp 16   Ht 5\' 3"  (1.6 m)   Wt 97.6 kg   SpO2 96%   BMI 38.12 kg/m   OBJECTIVE:  Constitutional: Resting comfortably, in no acute distress. Cardio: Regular rate and rhythm. No murmurs, rubs, or gallops.  Pulm: Clear to auscultation bilaterally. Normal work of breathing on room air. MSK: Mild indentation at bilateral lower legs from socks. Skin: Warm and dry. Neuro: Alert and oriented x3. No focal deficit noted. Psych: Pleasant mood and affect.   Pertinent Labs, Studies, and Procedures:     Latest Ref Rng & Units 06/24/2022    4:24 AM 06/22/2022    5:46 PM 01/22/2021    8:24 PM  CBC  WBC 4.0 - 10.5 K/uL 8.0  8.4  10.1   Hemoglobin 12.0 - 15.0 g/dL 13.0  13.8  14.2   Hematocrit 36.0 - 46.0 % 40.6  41.9  44.3   Platelets 150 - 400 K/uL 228  259  277        Latest Ref Rng & Units 06/24/2022    4:24 AM 06/23/2022    3:20 PM 06/22/2022    5:46 PM  CMP  Glucose 70 - 99 mg/dL 83  102  101   BUN 8 - 23 mg/dL 14  9  14    Creatinine 0.44 - 1.00 mg/dL 1.18  0.86  0.99   Sodium 135 - 145 mmol/L 139  140  143   Potassium 3.5 - 5.1 mmol/L 3.8  3.9  3.0   Chloride 98 - 111 mmol/L 107  105  104   CO2 22 - 32 mmol/L 22  24  28    Calcium 8.9 - 10.3 mg/dL 8.9  9.2  9.4     DG Chest 2 View  Result Date: 06/22/2022 CLINICAL DATA:  Chest pain that radiates into the left shoulder. EXAM: CHEST - 2 VIEW COMPARISON:  January 22, 2021 FINDINGS: The heart size and mediastinal contours are within normal limits. Mild atelectasis is seen within the bilateral lung bases. There is no evidence of a pleural effusion or pneumothorax. Radiopaque surgical clips are seen within the right upper quadrant. Multilevel degenerative changes seen throughout the thoracic spine.  IMPRESSION: Mild bibasilar atelectasis. Electronically Signed   By: Virgina Norfolk M.D.   On: 06/22/2022 18:46     Signed: Farrel Gordon, D.O.  Internal Medicine Resident, PGY-2 [facility]  Pager: [phone]

## 2022-06-24 NOTE — ED Notes (Signed)
Echo at bedside

## 2022-06-24 NOTE — Progress Notes (Signed)
[place] for heparin Indication: chest pain/ACS Brief A/P: aPTT within goal range Continue Heparin at current rate   Allergies  Allergen Reactions   Tramadol Nausea And Vomiting   Ciprofloxacin Rash    Patient Measurements: Height: 5\' 3"  (160 cm) Weight: 98.3 kg (216 lb 11.4 oz) IBW/kg (Calculated) : 52.4 Heparin Dosing Weight: 75.3 kg   Vital Signs: Temp: 98 F (36.7 C) (11/05 2300) Temp Source: Oral (11/05 2300) BP: 129/75 (11/05 2300) Pulse Rate: 70 (11/05 2300)  Labs: Recent Labs    06/22/22 1746 06/22/22 2036 06/23/22 0604 06/23/22 1520 06/23/22 2300  HGB 13.8  --   --   --   --   HCT 41.9  --   --   --   --   PLT 259  --   --   --   --   APTT  --   --  21* 64* 70*  HEPARINUNFRC  --   --  >1.10* >1.10*  --   CREATININE 0.99  --   --  0.86  --   TROPONINIHS 6 6  --   --   --      Estimated Creatinine Clearance: 73.9 mL/min (by C-G formula based on SCr of 0.86 mg/dL).  Assessment: 66 y.o. female with chest pain, h/o PE/DVT and Xarelto on hold, for heparin  Goal of Therapy:  Heparin level 0.3-0.7 units/ml Heparin level 66-102 units/ml Monitor platelets by anticoagulation protocol: Yes   Plan:  Continue Heparin at current rate   Phillis Knack, PharmD, BCPS

## 2022-06-24 NOTE — Progress Notes (Signed)
ANTICOAGULATION CONSULT NOTE - Initial Consult  Pharmacy Consult for heparin Indication:  Hx of VTE on Xarelto PTA/Unstable Angina  Allergies  Allergen Reactions   Tramadol Nausea And Vomiting   Ciprofloxacin Rash    Patient Measurements: Height: 5\' 3"  (160 cm) Weight: 98.3 kg (216 lb 11.4 oz) IBW/kg (Calculated) : 52.4 Heparin Dosing Weight: 75.3 kg   Vital Signs: Temp: 98.3 F (36.8 C) (11/06 0651) Temp Source: Oral (11/06 0651) BP: 114/71 (11/06 0630) Pulse Rate: 71 (11/06 0630)  Labs: Recent Labs    06/22/22 1746 06/22/22 2036 06/23/22 0604 06/23/22 0604 06/23/22 1520 06/23/22 2300 06/24/22 0424 06/24/22 0425  HGB 13.8  --   --   --   --   --  13.0  --   HCT 41.9  --   --   --   --   --  40.6  --   PLT 259  --   --   --   --   --  228  --   APTT  --   --  21*   < > 64* 70* 67*  --   HEPARINUNFRC  --   --  >1.10*  --  >1.10*  --   --  0.73*  CREATININE 0.99  --   --   --  0.86  --  1.18*  --   TROPONINIHS 6 6  --   --   --   --   --   --    < > = values in this interval not displayed.     Estimated Creatinine Clearance: 53.8 mL/min (A) (by C-G formula based on SCr of 1.18 mg/dL (H)).   Medical History: Past Medical History:  Diagnosis Date   Acute heart failure ([place]) 05/21/2018   Bronchitis due to COVID-19 virus 03/23/2019   DVT (deep venous thrombosis) ([place])    Gastroenteritis due to food toxin 11/02/2020   Hemorrhage of rectum and anus 05/06/2017   Hyperlipidemia    Hypertension    Leg pain    right   PE (pulmonary embolism)    Sleep apnea     Medications:  Scheduled:   amLODipine  5 mg Oral Daily   atorvastatin  40 mg Oral Daily   carvedilol  6.25 mg Oral BID WC   chlorthalidone  12.5 mg Oral Daily    Assessment: 17 yof with a history of HTN, HLD, EFPE/DVT in 08/2020 . Patient is presenting with sudden onset substernal chest pain with associated nausea, SOB and diaphoresis . Heparin per pharmacy consult placed for hx of VTE on Xarelto  PTA/Unstable Angina .   Patient was on Xarelto prior to arrival. Last dose 11/3 ~1900. Utilizing aPTT monitoring due to falsely high anti-Xa level secondary to DOAC use.   aPTT 67 which is therapeutic. Heparin level 0.73 units/mL which has decreased from >1.1 - will continue to monitor until correlating. CBC stable and WNL  Goal of Therapy:  Heparin level 0.3-0.7 units/ml Heparin level 66-102 units/ml Monitor platelets by anticoagulation protocol: Yes   Plan:  Continue heparin 1050 units/hr Daily heparin level and aPTT until correlating Daily CBC Continue to monitor H&H and platelets  Erskine Speed, PharmD Clinical Pharmacist 06/24/2022 7:12 AM

## 2022-06-24 NOTE — Progress Notes (Signed)
  Echocardiogram 2D Echocardiogram has been performed.  [name] 06/24/2022, 3:09 PM

## 2022-06-24 NOTE — Progress Notes (Signed)
Rounding Note    Patient Name: [name] Date of Encounter: 06/24/2022  [place] Cardiologist: None New- Dr. Margaretann Loveless  Subjective   No recurrent chest pain since admission  Inpatient Medications    Scheduled Meds:  amLODipine  5 mg Oral Daily   atorvastatin  40 mg Oral Daily   carvedilol  6.25 mg Oral BID WC   chlorthalidone  12.5 mg Oral Daily   Continuous Infusions:  heparin 1,050 Units/hr (06/23/22 1623)   PRN Meds: acetaminophen, bisacodyl, senna-docusate   Vital Signs    Vitals:   06/24/22 0651 06/24/22 0715 06/24/22 0734 06/24/22 0800  BP:  120/83  118/74  Pulse:  (!) 52 74 67  Resp:  18    Temp: 98.3 F (36.8 C)     TempSrc: Oral     SpO2:  95% 96% 94%  Weight:      Height:       No intake or output data in the 24 hours ending 06/24/22 0834    06/22/2022    5:38 PM 04/16/2022   11:01 AM 01/11/2022    6:19 PM  Last 3 Weights  Weight (lbs) 216 lb 11.4 oz 216 lb 12.8 oz 216 lb 0.8 oz  Weight (kg) 98.3 kg 98.34 kg 98 kg      Telemetry    SR - Personally Reviewed  ECG    SR T wave abnl - Personally Reviewed  Physical Exam   GEN: No acute distress.   Neck: No JVD Cardiac: RRR, no murmurs, rubs, or gallops.  Respiratory: Clear to auscultation bilaterally. GI: Soft, nontender, non-distended  MS: No edema; No deformity. Neuro:  Nonfocal  Psych: Normal affect   Labs    High Sensitivity Troponin:   Recent Labs  Lab 06/22/22 1746 06/22/22 2036  TROPONINIHS 6 6     Chemistry Recent Labs  Lab 06/22/22 1746 06/23/22 1520 06/24/22 0424  NA 143 140 139  K 3.0* 3.9 3.8  CL 104 105 107  CO2 28 24 22   GLUCOSE 101* 102* 83  BUN 14 9 14   CREATININE 0.99 0.86 1.18*  CALCIUM 9.4 9.2 8.9  MG  --  2.1  --   GFRNONAA >60 >60 52*  ANIONGAP 11 11 10     Lipids  Recent Labs  Lab 06/23/22 0606  CHOL 122  TRIG 75  HDL 39*  LDLCALC 68  CHOLHDL 3.1    Hematology Recent Labs  Lab 06/22/22 1746 06/24/22 0424  WBC  8.4 8.0  RBC 5.04 4.78  HGB 13.8 13.0  HCT 41.9 40.6  MCV 83.1 84.9  MCH 27.4 27.2  MCHC 32.9 32.0  RDW 13.9 14.1  PLT 259 228   Thyroid  Recent Labs  Lab 06/23/22 1520  TSH 1.119    BNP Recent Labs  Lab 06/22/22 1746  BNP 42.2    DDimer No results for input(s): "DDIMER" in the last 168 hours.   Radiology    DG Chest 2 View  Result Date: 06/22/2022 CLINICAL DATA:  Chest pain that radiates into the left shoulder. EXAM: CHEST - 2 VIEW COMPARISON:  January 22, 2021 FINDINGS: The heart size and mediastinal contours are within normal limits. Mild atelectasis is seen within the bilateral lung bases. There is no evidence of a pleural effusion or pneumothorax. Radiopaque surgical clips are seen within the right upper quadrant. Multilevel degenerative changes seen throughout the thoracic spine. IMPRESSION: Mild bibasilar atelectasis. Electronically Signed   By: Joyce Gross.D.  On: 06/22/2022 18:46    Cardiac Studies   CCTA and echo pending.  Patient Profile     66 y.o. 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  Presents with nitro responsive chest pain concerning for unstable angina.  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 -EKG without acute ST-T change, borderline T wave abnormality. -High sensitive troponin negative x2 6>6 -Risk factor including HTN, HLD, family hx, obesity  -Recommend further ischemic evaluation with CCTA, perform today. Diffusix catheter for IV access and metop tart 50 mg x 1 prior to scan.  -Echocardiogram is ordered, will follow - lipids are well controlled. - TSH wnl - A1C 5.9 prediabetic. - currently pain free, OK continue heparin gtt.   Chronic diastolic heart failure -Noted  hospitalization in 2409 for diastolic heart failure -Echocardiogram is pending, will follow -Takes Lasix 20 mg daily at home as needed   Hypertension -Blood pressure fairly controlled home medicine amlodipine 5 mg daily and carvedilol 6.25 mg twice daily and chlorthalidone 12.5 mg daily -Consider daily potassium supplements while on diuretic given hypokalemia   History of DVT and PE -On Xarelto at home, currently on heparin gtt.   Hyperlipidemia -on Lipitor      For questions or updates, please contact [place] Please consult [email or web address] for contact info under        Signed, Elouise Munroe, MD  06/24/2022, 8:34 AM

## 2022-06-24 NOTE — ED Notes (Signed)
Patient transported to CT 

## 2022-06-25 ENCOUNTER — Telehealth: Payer: Self-pay

## 2022-06-25 NOTE — Patient Outreach (Signed)
  Care Coordination [facility] Note Transition Care Management Unsuccessful Follow-up Telephone Call  Date of discharge and from where:  [name] 06/22/22-06/24/22  Attempts:  1st Attempt  Reason for unsuccessful TCM follow-up call:  No answer/busy  Johnney Killian, RN, BSN, CCM Care Management Coordinator [facility] Phone: [phone]: [phone]

## 2022-06-25 NOTE — Telephone Encounter (Signed)
Return pt's call. She stated her job might require a letter to return to work. She scheduled an appt for next Tuesday .She was discharged from the hospital yesterday. She stated she can wait until her appt to get a letter.

## 2022-06-25 NOTE — Telephone Encounter (Signed)
Requesting to speak with a nurse about getting a letter for work. Please call pt back.  

## 2022-07-02 ENCOUNTER — Ambulatory Visit (INDEPENDENT_AMBULATORY_CARE_PROVIDER_SITE_OTHER): Payer: 59 | Admitting: Internal Medicine

## 2022-07-02 ENCOUNTER — Telehealth: Payer: Self-pay

## 2022-07-02 ENCOUNTER — Other Ambulatory Visit (HOSPITAL_COMMUNITY): Payer: Self-pay

## 2022-07-02 ENCOUNTER — Ambulatory Visit (INDEPENDENT_AMBULATORY_CARE_PROVIDER_SITE_OTHER): Payer: 59

## 2022-07-02 VITALS — BP 122/62 | HR 67 | Temp 98.0°F | Ht 63.0 in | Wt 214.0 lb

## 2022-07-02 DIAGNOSIS — K219 Gastro-esophageal reflux disease without esophagitis: Secondary | ICD-10-CM | POA: Diagnosis not present

## 2022-07-02 DIAGNOSIS — I10 Essential (primary) hypertension: Secondary | ICD-10-CM | POA: Diagnosis not present

## 2022-07-02 DIAGNOSIS — Z0000 Encounter for general adult medical examination without abnormal findings: Secondary | ICD-10-CM

## 2022-07-02 DIAGNOSIS — R7303 Prediabetes: Secondary | ICD-10-CM | POA: Insufficient documentation

## 2022-07-02 DIAGNOSIS — R0789 Other chest pain: Secondary | ICD-10-CM | POA: Diagnosis not present

## 2022-07-02 DIAGNOSIS — G4733 Obstructive sleep apnea (adult) (pediatric): Secondary | ICD-10-CM | POA: Diagnosis not present

## 2022-07-02 MED ORDER — CHLORTHALIDONE 25 MG PO TABS
12.5000 mg | ORAL_TABLET | Freq: Every day | ORAL | 3 refills | Status: DC
  Filled 2022-07-02: qty 15, 30d supply, fill #0
  Filled 2022-08-01: qty 15, 30d supply, fill #1
  Filled 2022-09-10: qty 15, 30d supply, fill #2
  Filled 2022-10-07: qty 15, 30d supply, fill #3
  Filled 2022-11-08: qty 15, 30d supply, fill #4
  Filled 2022-12-09: qty 15, 30d supply, fill #5
  Filled 2023-01-08: qty 15, 30d supply, fill #6
  Filled 2023-02-10: qty 15, 30d supply, fill #7
  Filled 2023-03-10: qty 15, 30d supply, fill #8
  Filled 2023-04-11: qty 15, 30d supply, fill #9
  Filled 2023-05-13: qty 15, 30d supply, fill #10

## 2022-07-02 NOTE — Telephone Encounter (Signed)
Requesting to speak with Dr. Sloan Leiter, about getting a letter for work.  Per pt the letter needs to states no restriction. Please call pt back.

## 2022-07-02 NOTE — Telephone Encounter (Signed)
Pt called / informed she can pick up letter at the front desk.

## 2022-07-02 NOTE — Patient Instructions (Signed)
Thank you, Ms.[name] for allowing [place] to provide your care today.   Chest pain The test in the hospital showed no buildup of cholesterol in blood vessel to heart, which is great! Please follow-up with Cardiology 11/17. It is important that we make sure your cholesterol is well controlled and that we recheck blood work for diabetes in a couple months. If you develop chest pain again please go back to Emergency room.  Sleep apnea. I have placed orders for sleep study.   I have ordered the following medication/changed the following medications:   Stop the following medications: Medications Discontinued During This Encounter  Medication Reason   chlorthalidone (HYGROTON) 25 MG tablet Reorder     Start the following medications: Meds ordered this encounter  Medications   chlorthalidone (HYGROTON) 25 MG tablet    Sig: Take 0.5 tablets (12.5 mg total) by mouth daily.    Dispense:  45 tablet    Refill:  3    IM program     Follow up: 3 months   We look forward to seeing you next time. Please call our clinic at [phone] if you have any questions or concerns. The best time to call is Monday-Friday from 9am-4pm, but there is someone available 24/7. If after hours or the weekend, call the main hospital number and ask for the Internal Medicine Resident On-Call. If you need medication refills, please notify your pharmacy one week in advance and they will send [place] a request.   Thank you for trusting me with your care. Wishing you the best!   Rudene Christians, DO [facility]

## 2022-07-02 NOTE — Progress Notes (Unsigned)
Subjective:  CC: hospital follow-up for chest pain  HPI:  [name] is a 66 y.o. female with a past medical history stated below and presents today for hospital follow-up. She presented with sudden onset chest pain that radiated to left arm with associated diaphoresis. She was admitted de to. Please see problem based assessment and plan for additional details.  Past Medical History:  Diagnosis Date   Acute heart failure (HCC) 05/21/2018   Bronchitis due to COVID-19 virus 03/23/2019   DVT (deep venous thrombosis) (HCC)    Gastroenteritis due to food toxin 11/02/2020   Hemorrhage of rectum and anus 05/06/2017   Hyperlipidemia    Hypertension    Leg pain    right   PE (pulmonary embolism)    Sleep apnea     Current Outpatient Medications on File Prior to Visit  Medication Sig Dispense Refill   amLODipine (NORVASC) 5 MG tablet Take 1 tablet (5 mg total) by mouth daily. 90 tablet 2   atorvastatin (LIPITOR) 40 MG tablet Take 1 tablet (40 mg total) by mouth daily. 90 tablet 2   carvedilol (COREG) 6.25 MG tablet Take 1 tablet (6.25 mg total) by mouth 2 (two) times daily with a meal. 180 tablet 2   chlorthalidone (HYGROTON) 25 MG tablet Take 0.5 tablets (12.5 mg total) by mouth daily. 45 tablet 2   fluticasone (FLONASE) 50 MCG/ACT nasal spray Place 1 spray into both nostrils daily. (Patient taking differently: Place 1 spray into both nostrils daily as needed for allergies.) 16 g 2   furosemide (LASIX) 20 MG tablet Take 1 tablet (20 mg total) by mouth daily as needed. (Patient taking differently: Take 20 mg by mouth daily as needed for fluid or edema.) 90 tablet 2   loratadine (CLARITIN) 10 MG tablet Take 10 mg by mouth daily.     omeprazole (PRILOSEC) 20 MG capsule Take 2 capsules (40 mg total) by mouth 2 (two) times daily before a meal. 168 capsule 0   rivaroxaban (XARELTO) 20 MG TABS tablet Take 1 tablet (20 mg total) by mouth daily with supper. 90 tablet 2   No current  facility-administered medications on file prior to visit.    Family History  Problem Relation Age of Onset   Hypertension Other    Heart disease Other    Diabetes Other    Coronary artery disease Other     Social History   Socioeconomic History   Marital status: Legally Separated    Spouse name: Not on file   Number of children: Not on file   Years of education: Not on file   Highest education level: Not on file  Occupational History   Not on file  Tobacco Use   Smoking status: Never   Smokeless tobacco: Never  Vaping Use   Vaping Use: Never used  Substance and Sexual Activity   Alcohol use: No   Drug use: Not on file   Sexual activity: Not Currently  Other Topics Concern   Not on file  Social History Narrative   Not on file   Social Determinants of Health   Financial Resource Strain: Not on file  Food Insecurity: Not on file  Transportation Needs: Not on file  Physical Activity: Not on file  Stress: Not on file  Social Connections: Not on file  Intimate Partner Violence: Not on file    Review of Systems: ROS negative except for what is noted on the assessment and plan.  Objective:  Vitals:   07/02/22 0852  BP: 122/62  Pulse: 67  Temp: 98 F (36.7 C)  TempSrc: Oral  SpO2: 97%  Weight: 214 lb (97.1 kg)  Height: 5\' 3"  (1.6 m)    Physical Exam: Constitutional: well-appearing *** sitting in ***, in no acute distress HENT: normocephalic atraumatic, mucous membranes moist Eyes: conjunctiva non-erythematous Neck: supple Cardiovascular: regular rate and rhythm, no m/r/g Pulmonary/Chest: normal work of breathing on room air, lungs clear to auscultation bilaterally Abdominal: soft, non-tender, non-distended MSK: normal bulk and tone Neurological: alert & oriented x 3, 5/5 strength in bilateral upper and lower extremities, normal gait Skin: warm and dry Psych: ***     Assessment & Plan:  No problem-specific Assessment & Plan notes found for this  encounter.    Echo showed LV EF 55-60% with normal function. Cards was consulted and TSH wbl with lipid panel at goal at 68, and A1c at 5.9%Coronary CTA showed coronary calcium score of 0  Patient {GC/[id number]::"discussed with","seen with"} Dr. . Hoffman","Mullen","Narendra","Machen","Vincent","Guilloud","Lau"}   {[id number]::"KLKJZPHX","T, D.O. [facility]  PGY-2 Pager: [phone]  Phone: [phone] Date 07/02/2022  Time 8:55 AM

## 2022-07-02 NOTE — Progress Notes (Signed)
Subjective:   [name] is a 66 y.o. female who presents for an Initial Medicare Annual Wellness Visit. I connected with  [name] on 10/04/22 by a  Face-To-Face  enabled telemedicine application and verified that I am speaking with the correct person using two identifiers.  Patient Location: Other:  Office/Clinic  Provider Location: Office/Clinic  I discussed the limitations of evaluation and management by telemedicine. The patient expressed understanding and agreed to proceed.  Review of Systems    Defer to PCP       Objective:    Today's Vitals   07/02/22 0944 07/02/22 0945  BP: 122/62   Pulse: 67   Temp: 98 F (36.7 C)   SpO2: 97%   Weight: 214 lb (97.1 kg)   Height: 5' 3"$  (1.6 m)   PainSc:  0-No pain   Body mass index is 37.91 kg/m.     10/01/2022   10:01 AM 07/02/2022    9:46 AM 07/02/2022    8:54 AM 06/22/2022    5:40 PM 04/16/2022   11:00 AM 01/11/2022    6:20 PM 11/21/2021    9:54 AM  Advanced Directives  Does Patient Have a Medical Advance Directive? No No No No No No No  Would patient like information on creating a medical advance directive? No - Patient declined No - Patient declined No - Patient declined  No - Patient declined No - Patient declined No - Patient declined    Current Medications (verified) Outpatient Encounter Medications as of 07/02/2022  Medication Sig   chlorthalidone (HYGROTON) 25 MG tablet Take 0.5 tablets (12.5 mg total) by mouth daily.   fluticasone (FLONASE) 50 MCG/ACT nasal spray Place 1 spray into both nostrils daily. (Patient taking differently: Place 1 spray into both nostrils daily as needed for allergies.)   furosemide (LASIX) 20 MG tablet Take 1 tablet (20 mg total) by mouth daily as needed. (Patient taking differently: Take 20 mg by mouth daily as needed for fluid or edema.)   loratadine (CLARITIN) 10 MG tablet Take 10 mg by mouth daily.   [DISCONTINUED] amLODipine (NORVASC) 5 MG tablet Take 1 tablet (5 mg  total) by mouth daily.   [DISCONTINUED] atorvastatin (LIPITOR) 40 MG tablet Take 1 tablet (40 mg total) by mouth daily.   [DISCONTINUED] carvedilol (COREG) 6.25 MG tablet Take 1 tablet (6.25 mg total) by mouth 2 (two) times daily with a meal.   [DISCONTINUED] omeprazole (PRILOSEC) 20 MG capsule Take 2 capsules (40 mg total) by mouth 2 (two) times daily before a meal.   [DISCONTINUED] rivaroxaban (XARELTO) 20 MG TABS tablet Take 1 tablet (20 mg total) by mouth daily with supper.   No facility-administered encounter medications on file as of 07/02/2022.    Allergies (verified) Tramadol and Ciprofloxacin   History: Past Medical History:  Diagnosis Date   (HFpEF) heart failure with preserved ejection fraction (HCC)    Acute kidney injury ([place])    Bronchitis due to COVID-19 virus 03/23/2019   DVT (deep venous thrombosis) (HCC)    Hyperlipidemia    Hypertension    Leg pain    right   Obesity (BMI 30-39.9)    OSA (obstructive sleep apnea)    PE (pulmonary embolism)    Prediabetes    Sleep apnea    Past Surgical History:  Procedure Laterality Date   ABDOMINAL HYSTERECTOMY     APPENDECTOMY     BACK SURGERY     CHOLECYSTECTOMY     Gall Bladder  removal   COLONOSCOPY WITH PROPOFOL N/A 05/06/2017   Procedure: COLONOSCOPY WITH PROPOFOL;  Surgeon: Wilford Corner, MD;  Location: [facility];  Service: Endoscopy;  Laterality: N/A;   LAMINOTOMY     right at L4-5 with a disc bulge and superimposed right lateral recess protrusion encroaching on the L5 root   SPINE SURGERY     decompression surgery   Family History  Problem Relation Age of Onset   Hypertension Other    Heart disease Other    Diabetes Other    Coronary artery disease Other    Social History   Socioeconomic History   Marital status: Legally Separated    Spouse name: Not on file   Number of children: Not on file   Years of education: Not on file   Highest education level: Not on file  Occupational History   Not  on file  Tobacco Use   Smoking status: Never   Smokeless tobacco: Never  Vaping Use   Vaping Use: Never used  Substance and Sexual Activity   Alcohol use: No   Drug use: Not on file   Sexual activity: Not Currently  Other Topics Concern   Not on file  Social History Narrative   Not on file   Social Determinants of Health   Financial Resource Strain: Medium Risk (07/02/2022)   Overall Financial Resource Strain (CARDIA)    Difficulty of Paying Living Expenses: Somewhat hard  Food Insecurity: No Food Insecurity (07/02/2022)   Hunger Vital Sign    Worried About Running Out of Food in the Last Year: Never true    Ran Out of Food in the Last Year: Never true  Transportation Needs: No Transportation Needs (07/02/2022)   PRAPARE - Hydrologist (Medical): No    Lack of Transportation (Non-Medical): No  Physical Activity: Inactive (07/02/2022)   Exercise Vital Sign    Days of Exercise per Week: 0 days    Minutes of Exercise per Session: 0 min  Stress: No Stress Concern Present (07/02/2022)   [name]    Feeling of Stress : Only a little  Social Connections: Moderately Integrated (07/02/2022)   Social Connection and Isolation Panel [NHANES]    Frequency of Communication with Friends and Family: Never    Frequency of Social Gatherings with Friends and Family: Once a week    Attends Religious Services: More than 4 times per year    Active Member of [employer] or Organizations: Yes    Attends Music therapist: More than 4 times per year    Marital Status: Married    Tobacco Counseling Counseling given: Not Answered   Clinical Intake:  Pre-visit preparation completed: Yes  Pain : No/denies pain Pain Score: 0-No pain     Nutritional Risks: None Diabetes: No  How often do you need to have someone help you when you read instructions, pamphlets, or other written materials  from your doctor or pharmacy?: 1 - Never What is the last grade level you completed in school?: college 2 years  Diabetic?No  Interpreter Needed?: No  Information entered by :: [name],cma 07/02/22 9:46am   Activities of Daily Living    10/01/2022    9:59 AM 07/02/2022    9:46 AM  In your present state of health, do you have any difficulty performing the following activities:  Hearing? 0 0  Vision? 0 0  Difficulty concentrating or making decisions? 0 0  Walking or climbing stairs? 0 0  Dressing or bathing? 0 0  Doing errands, shopping? 0 0    Patient Care Team: Masters, Joellen Jersey, DO as PCP - General Elouise Munroe, MD as PCP - Cardiology (Cardiology) Phylliss Bob, MD (Orthopedic Surgery)  Indicate any recent Medical Services you may have received from other than [facility] providers in the past year (date may be approximate).     Assessment:   This is a routine wellness examination for [facility]  Hearing/Vision screen No results found.  Dietary issues and exercise activities discussed:     Goals Addressed   None   Depression Screen    07/02/2022    9:46 AM 07/02/2022    8:57 AM 04/16/2022   11:05 AM 01/04/2022   10:17 AM 11/21/2021   10:48 AM 10/24/2021    9:21 AM 08/07/2021    2:53 PM  PHQ 2/9 Scores  PHQ - 2 Score 0 0 0 0 0 0 0  PHQ- 9 Score 0 0   0  4    Fall Risk    10/01/2022    9:59 AM 07/02/2022    9:46 AM 07/02/2022    8:57 AM 04/16/2022   11:00 AM 01/04/2022   10:17 AM  Fall Risk   Falls in the past year? 0 0 0 0 0  Number falls in past yr: 0 0 0 0   Injury with Fall? 0 0 0 0   Risk for fall due to :  No Fall Risks  No Fall Risks No Fall Risks  Follow up Falls evaluation completed Falls evaluation completed;Falls prevention discussed Falls evaluation completed Falls evaluation completed;Falls prevention discussed Falls evaluation completed    FALL RISK PREVENTION PERTAINING TO THE HOME:  Any stairs in or around the home? No  If so, are there  any without handrails? No  Home free of loose throw rugs in walkways, pet beds, electrical cords, etc? No  Adequate lighting in your home to reduce risk of falls? No   ASSISTIVE DEVICES UTILIZED TO PREVENT FALLS:  Life alert? No  Use of a cane, walker or w/c? No  Grab bars in the bathroom? No  Shower chair or bench in shower? No  Elevated toilet seat or a handicapped toilet? No   TIMED UP AND GO:  Was the test performed? Yes .  Length of time to ambulate 10 feet: 1 min   Gait slow and steady without use of assistive device  Cognitive Function:        07/02/2022    9:46 AM  6CIT Screen  What Year? 0 points  What month? 0 points  Count back from 20 0 points  Months in reverse 0 points  Repeat phrase 0 points    Immunizations Immunization History  Administered Date(s) Administered   PPD Test 07/30/2016   Tdap 04/16/2022    TDAP status: Up to date  Flu Vaccine status: Due, Education has been provided regarding the importance of this vaccine. Advised may receive this vaccine at local pharmacy or Health Dept. Aware to provide a copy of the vaccination record if obtained from local pharmacy or Health Dept. Verbalized acceptance and understanding.  Pneumococcal vaccine status: Due, Education has been provided regarding the importance of this vaccine. Advised may receive this vaccine at local pharmacy or Health Dept. Aware to provide a copy of the vaccination record if obtained from local pharmacy or Health Dept. Verbalized acceptance and understanding.  Covid-19 vaccine status: Information provided on how  to obtain vaccines.   Qualifies for Shingles Vaccine? No   Zostavax completed No   Shingrix Completed?: No.    Education has been provided regarding the importance of this vaccine. Patient has been advised to call insurance company to determine out of pocket expense if they have not yet received this vaccine. Advised may also receive vaccine at local pharmacy or Health  Dept. Verbalized acceptance and understanding.  Screening Tests Health Maintenance  Topic Date Due   COVID-19 Vaccine (1) Never done   MAMMOGRAM  Never done   Zoster Vaccines- Shingrix (1 of 2) Never done   PAP SMEAR-Modifier  10/08/2019   INFLUENZA VACCINE  Never done   Medicare Annual Wellness (AWV)  07/03/2023   COLONOSCOPY (Pts 45-3yr Insurance coverage will need to be confirmed)  05/07/2027   DTaP/Tdap/Td (2 - Td or Tdap) 04/16/2032   Hepatitis C Screening  Completed   HIV Screening  Completed   HPV VACCINES  Aged Out    Health Maintenance  Health Maintenance Due  Topic Date Due   COVID-19 Vaccine (1) Never done   MAMMOGRAM  Never done   Zoster Vaccines- Shingrix (1 of 2) Never done   PAP SMEAR-Modifier  10/08/2019   INFLUENZA VACCINE  Never done    Colorectal cancer screening: Type of screening: Colonoscopy. Completed 05/06/2017. Repeat every 10 years  Mammogram status: Ordered 04/16/2022. Pt provided with contact info and advised to call to schedule appt.     Lung Cancer Screening: (Low Dose CT Chest recommended if Age 64-80years, 30 pack-year currently smoking OR have quit w/in 15years.) does not qualify.   Lung Cancer Screening Referral: N.A  Additional Screening:  Hepatitis C Screening: does not qualify; Completed 04/16/2022  Vision Screening: Recommended annual ophthalmology exams for early detection of glaucoma and other disorders of the eye. Is the patient up to date with their annual eye exam?  No  Who is the provider or what is the name of the office in which the patient attends annual eye exams? N/A If pt is not established with a provider, would they like to be referred to a provider to establish care? No .   Dental Screening: Recommended annual dental exams for proper oral hygiene  Community Resource Referral / Chronic Care Management: CRR required this visit?  No   CCM required this visit?  No      Plan:     I have personally reviewed  and noted the following in the patient's chart:   Medical and social history Use of alcohol, tobacco or illicit drugs  Current medications and supplements including opioid prescriptions. Patient is not currently taking opioid prescriptions. Functional ability and status Nutritional status Physical activity Advanced directives List of other physicians Hospitalizations, surgeries, and ER visits in previous 12 months Vitals Screenings to include cognitive, depression, and falls Referrals and appointments  In addition, I have reviewed and discussed with patient certain preventive protocols, quality metrics, and best practice recommendations. A written personalized care plan for preventive services as well as general preventive health recommendations were provided to patient.     JKerin Perna [facility]  10/04/2022   Nurse Notes: Face-To-Face visit  Ms. MLemmon, Thank you for taking time to come for your Medicare Wellness Visit. I appreciate your ongoing commitment to your health goals. Please review the following plan we discussed and let me know if I can assist you in the future.   These are the goals we discussed:  Goals  None     This is a list of the screening recommended for you and due dates:  Health Maintenance  Topic Date Due   COVID-19 Vaccine (1) Never done   Mammogram  Never done   Zoster (Shingles) Vaccine (1 of 2) Never done   Pap Smear  10/08/2019   Flu Shot  Never done   Medicare Annual Wellness Visit  07/03/2023   Colon Cancer Screening  05/07/2027   DTaP/Tdap/Td vaccine (2 - Td or Tdap) 04/16/2032   Hepatitis C Screening: USPSTF Recommendation to screen - Ages 18-79 yo.  Completed   HIV Screening  Completed   HPV Vaccine  Aged Out

## 2022-07-02 NOTE — Telephone Encounter (Signed)
  Call from patient -needs no restrictions added to letter written this morning to return to work.

## 2022-07-03 ENCOUNTER — Other Ambulatory Visit (HOSPITAL_COMMUNITY): Payer: Self-pay

## 2022-07-03 ENCOUNTER — Encounter: Payer: Self-pay | Admitting: Internal Medicine

## 2022-07-03 DIAGNOSIS — K219 Gastro-esophageal reflux disease without esophagitis: Secondary | ICD-10-CM | POA: Insufficient documentation

## 2022-07-03 MED ORDER — OMEPRAZOLE 20 MG PO CPDR
40.0000 mg | DELAYED_RELEASE_CAPSULE | Freq: Every day | ORAL | 3 refills | Status: DC
  Filled 2022-07-03: qty 60, 30d supply, fill #0
  Filled 2022-08-01: qty 60, 30d supply, fill #1
  Filled 2022-09-06 (×2): qty 60, 30d supply, fill #2
  Filled 2022-10-07: qty 60, 30d supply, fill #3
  Filled 2022-11-07: qty 60, 30d supply, fill #4
  Filled 2022-12-09: qty 60, 30d supply, fill #5
  Filled 2023-01-08: qty 60, 30d supply, fill #6
  Filled 2023-02-10: qty 60, 30d supply, fill #7
  Filled 2023-03-10: qty 60, 30d supply, fill #8
  Filled 2023-04-11: qty 60, 30d supply, fill #9
  Filled 2023-05-13: qty 60, 30d supply, fill #10
  Filled 2023-06-20: qty 60, 30d supply, fill #11

## 2022-07-03 NOTE — Assessment & Plan Note (Signed)
Blood pressure wellcontrolled at 122/62. Medications include amlodpine 5 mg, coreg 6.25 mg BID, and chlorthalidone 12.5 qd. P: Refill sent for chlorthalidone

## 2022-07-03 NOTE — Assessment & Plan Note (Signed)
Prior diagnosis of sleep apnea, but she has not had CPAP since 2016.  P: Split night study

## 2022-07-03 NOTE — Assessment & Plan Note (Signed)
She developed 15 minutes of chest pain with associated diaphoresis. EMS was called. Work up in ED showed no ischemic changes on EKG and troponin low at 6 and again on repeat. Cardiology was consulted and differentials included unstable angina versus esophageal spasm. Further evaluation was completed with coronary calcium score and this was 0. P: Coronary calcium score reassuring. She does have risk factors for CAD with prediabetes, obesity, and hyperlipidemia. LDL is well controlled for primary prevention on atorvastatin 40.

## 2022-07-03 NOTE — Assessment & Plan Note (Signed)
With recent admission for atypical chest pain, differential included esophageal spasm. She does have acid reflux frequently and has not taken prilosec in some time. P Start prilosec 40 mg qd

## 2022-07-04 NOTE — Progress Notes (Unsigned)
Cardiology Office Note:    Date:  07/05/2022   ID:  [name], DOB 28-Jan-1956, MRN [id number]  PCP:  Christiana Fuchs, [place] Providers Cardiologist:  Elouise Munroe, MD { Click to update primary MD,subspecialty MD or APP then REFRESH:1}    Referring MD: Christiana Fuchs, DO   CC: Here for hospital follow-up  History of Present Illness:    [name] is a 66 y.o. 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  EMS was called and she received aspirin and nitroglycerin with improvement in her symptoms.  BP in ED 104/55.  Patient's EKG did not reveal any acute ischemic changes, but revealed borderline T wave abnormality.  Chest x-ray negative.  Troponins were unremarkable.  Cardiology was consulted.  Echocardiogram revealed LVEF 55 to 60%, no RWMA, normal RV function, mild LVH, normal PASP.  Lipid panel revealed LDL at 68, normal TSH.  Coronary CTA was recommended by cardiology and revealed coronary calcium score of 0, no evidence of CAD, her symptoms did not recur prior to discharge.  Hospital course was complicated by AKI and hypokalemia, received IV fluids and potassium supplement.  Was told to follow-up with outpatient cardiology.  Today she presents for follow-up.  She states she is doing well since she left the hospital.  She says she has never received cardiac outpatient care before, has never seen a cardiologist.  She has returned to work as a Scientist, water quality.  Denies any chest pain, shortness of breath, PND, orthopnea, PND, swelling, significant weight changes,  bleeding, dizziness, syncope, presyncope, acute bleeding, or claudication.  Does not regularly check blood pressure at home.  Stated her primary care provider is setting up a repeat sleep study as she has OSA but does not wear any CPAP or oxygen therapy at night currently.  Mobility and physical activity is limited due to chronic low back pain.  Overall doing well and denies any other questions or concerns today.   Past Medical History:  Diagnosis Date   (HFpEF) heart failure with preserved ejection fraction (HCC)    Acute kidney injury ([place])    Bronchitis due to COVID-19 virus 03/23/2019   DVT (deep venous thrombosis) (HCC)    Hyperlipidemia    Hypertension    Leg pain    right   Obesity (BMI 30-39.9)    OSA (obstructive sleep apnea)    PE (pulmonary embolism)    Prediabetes    Sleep apnea     Past Surgical History:  Procedure Laterality Date   ABDOMINAL HYSTERECTOMY     APPENDECTOMY     BACK SURGERY     CHOLECYSTECTOMY     Gall Bladder removal   COLONOSCOPY WITH PROPOFOL N/A 05/06/2017   Procedure: COLONOSCOPY WITH PROPOFOL;  Surgeon: Wilford Corner, MD;  Location: [facility];  Service: Endoscopy;  Laterality: N/A;   LAMINOTOMY     right at L4-5 with a disc bulge and superimposed right lateral recess protrusion encroaching on the L5 root   SPINE SURGERY     decompression surgery    Current Medications: Current Meds  Medication Sig   chlorthalidone (HYGROTON) 25 MG tablet Take 0.5 tablets (12.5 mg total) by mouth daily.   fluticasone (FLONASE) 50 MCG/ACT nasal spray Place 1 spray into both nostrils daily. (Patient taking differently: Place 1 spray into both nostrils daily as needed for allergies.)   furosemide (LASIX) 20 MG tablet Take 1 tablet (20 mg total) by mouth daily as needed. (Patient taking differently: Take 20 mg by mouth daily as needed for fluid or edema.)   loratadine (CLARITIN) 10 MG tablet Take 10 mg by mouth daily.   omeprazole (PRILOSEC) 20 MG capsule  Take 2 capsules (40 mg total) by mouth daily.   [DISCONTINUED] amLODipine (NORVASC) 5 MG tablet Take 1 tablet (5 mg total) by mouth daily.   [DISCONTINUED] atorvastatin (LIPITOR) 40 MG tablet Take 1 tablet (40 mg total) by mouth daily.   [DISCONTINUED] carvedilol (COREG) 6.25 MG tablet Take 1 tablet (6.25 mg total) by mouth 2 (two) times daily with a meal.   [DISCONTINUED] rivaroxaban (XARELTO) 20 MG TABS tablet Take 1 tablet (20 mg total) by mouth daily with supper.     Allergies:   Tramadol and Ciprofloxacin   Social History   Socioeconomic History   Marital status: Legally Separated    Spouse name: Not on file   Number of children: Not on file   Years of education: Not on file   Highest education level: Not on file  Occupational History   Not on file  Tobacco Use   Smoking status: Never   Smokeless tobacco: Never  Vaping Use   Vaping Use: Never used  Substance and Sexual Activity   Alcohol use: No   Drug use: Not on file   Sexual activity: Not Currently  Other Topics Concern   Not on file  Social History Narrative   Not on file   Social Determinants of Health   Financial Resource Strain: Medium Risk (07/02/2022)   Overall Financial Resource Strain (CARDIA)    Difficulty of Paying Living Expenses: Somewhat hard  Food Insecurity: No Food Insecurity (07/02/2022)   Hunger Vital Sign    Worried About Running Out of Food in the Last Year: Never true    Ran Out of Food in the Last Year: Never true  Transportation Needs: No Transportation Needs (07/02/2022)   PRAPARE - Hydrologist (Medical): No    Lack of Transportation (Non-Medical): No  Physical Activity: Inactive (07/02/2022)   Exercise Vital Sign    Days of Exercise per Week: 0 days    Minutes of Exercise per Session: 0 min  Stress: No Stress Concern Present (07/02/2022)   [place]    Feeling of Stress : Only a little   Social Connections: Moderately Integrated (07/02/2022)   Social Connection and Isolation Panel [NHANES]    Frequency of Communication with Friends and Family: Never    Frequency of Social Gatherings with Friends and Family: Once a week    Attends Religious Services: More than 4 times per year    Active Member of [employer] or Organizations: Yes    Attends Music therapist: More than 4 times per year    Marital Status: Married     Family History: The patient's family history includes Coronary artery disease in an other family member; Diabetes in an other family member; Heart disease in an other family member; Hypertension in an other family member.  ROS:   Review of Systems  Constitutional:  Negative.   HENT: Negative.    Eyes: Negative.   Respiratory: Negative.    Cardiovascular: Negative.   Gastrointestinal: Negative.   Genitourinary: Negative.   Musculoskeletal:  Positive for back pain. Negative for falls, joint pain, myalgias and neck pain.  Skin: Negative.   Neurological: Negative.   Endo/Heme/Allergies: Negative.   Psychiatric/Behavioral: Negative.      Please see the history of present illness.    All other systems reviewed and are negative.  EKGs/Labs/Other Studies Reviewed:    The following studies were reviewed today:   EKG:  EKG is not ordered today.    Coronary CTA on June 24, 2022: 1.  Coronary calcium score 0.  This was 1st percentile for demographic. 2.  Normal coronary origin with right dominance. 3. CAD-RADS 0.  No evidence of coronary artery disease.  Consider not-atherosclerotic causes of chest pain. 4.  No acute extracardiac abnormality.  2D complete echo on June 24, 2022:  1. Left ventricular ejection fraction, by estimation, is 55 to 60%. The  left ventricle has normal function. The left ventricle has no regional  wall motion abnormalities. There is mild left ventricular hypertrophy.  Left ventricular diastolic parameters  were  normal.   2. Right ventricular systolic function is normal. The right ventricular  size is normal. There is normal pulmonary artery systolic pressure.   3. The mitral valve is normal in structure. No evidence of mitral valve  regurgitation. No evidence of mitral stenosis.   4. The aortic valve was not well visualized. Aortic valve regurgitation  is not visualized. No aortic stenosis is present.   5. The inferior vena cava is normal in size with greater than 50%  respiratory variability, suggesting right atrial pressure of 3 mmHg.   Recent Labs: 06/22/2022: B Natriuretic Peptide 42.2 06/23/2022: Magnesium 2.1; TSH 1.119 06/24/2022: BUN 14; Creatinine, Ser 1.18; Hemoglobin 13.0; Platelets 228; Potassium 3.8; Sodium 139  Recent Lipid Panel    Component Value Date/Time   CHOL 122 06/23/2022 0606   CHOL 123 11/22/2019 0927   TRIG 75 06/23/2022 0606   HDL 39 (L) 06/23/2022 0606   HDL 44 11/22/2019 0927   CHOLHDL 3.1 06/23/2022 0606   VLDL 15 06/23/2022 0606   LDLCALC 68 06/23/2022 0606   LDLCALC 64 11/22/2019 0927         Physical Exam:    VS:  BP 104/66 (BP Location: Left Arm, Patient Position: Sitting, Cuff Size: Large)   Pulse 68   Ht _0  (1.6 m)   Wt 212 lb (96.2 kg)   SpO2 93%   BMI 37.55 kg/m     Wt Readings from Last 3 Encounters:  07/05/22 212 lb (96.2 kg)  07/02/22 214 lb (97.1 kg)  07/02/22 214 lb (97.1 kg)     GEN: Obese, 66 y.o. African American female in no acute distress HEENT: Normal NECK: No JVD; No carotid bruits CARDIAC: S1/S2, RRR, no murmurs, rubs, gallops; 2+ peripheral pulses throughout, strong and equal bilaterally RESPIRATORY:  Clear and diminished to auscultation without rales, wheezing or rhonchi  MUSCULOSKELETAL:  No edema; No deformity  SKIN: Warm and dry NEUROLOGIC:  Alert and oriented x 3 PSYCHIATRIC:  Normal affect   ASSESSMENT:    1. Chronic diastolic heart failure ([place])   2. Hyperlipidemia, unspecified hyperlipidemia type   3.  Essential hypertension   4. Hypertension, unspecified type   5. History of pulmonary embolus (PE)   6. History of DVT (deep vein thrombosis)   7. OSA (  obstructive sleep apnea)   8. Obesity (BMI 30-39.9)   9. Prediabetes   10. AKI (acute kidney injury) ([place])    PLAN:    In order of problems listed above:  Chronic diastolic heart failure TTE during most recent hospitalization revealed EF 55 to 60%, no RWMA, mild LVH with normal PASP. Euvolemic and well compensated on exam. Low sodium diet, fluid restriction <2L, and daily weights encouraged. Educated to contact our office for weight gain of 2 lbs overnight or 5 lbs in one week.  Continue carvedilol, heart chlorthalidone, and Lasix. Heart healthy diet and regular cardiovascular exercise as tolerated encouraged.   Hyperlipidemia Recent labs revealed total cholesterol 122, HDL 39, LDL 68, triglycerides 75.  Continue Lipitor. Heart healthy diet and regular cardiovascular exercise as tolerated encouraged.   Hypertension BP today 104/66. Discussed to monitor BP at home at least 2 hours after medications and sitting for 5-10 minutes.  BP log given today in office.  Continue amlodipine, carvedilol, and chlorthalidone. Heart healthy diet and regular cardiovascular exercise as tolerated encouraged.   History of PE and DVT Remote history.  She does have recurrent history of IVC filter.  She denies any issues of being on Xarelto 20 mg daily.  Creatinine clearance 73.  Continue Xarelto at current dosage.   OSA not on CPAP She is currently not on any CPAP machine or oxygen therapy at night.  She states it has been many years since her last sleep study.  Her primary care provider is arranging a repeat sleep study soon. Continue to follow with PCP.  Discussed health risks of untreated OSA.  Obesity BMI today 37.55.  Weight loss via diet and exercise encouraged. Discussed the impact being overweight would have on cardiovascular risk. Heart healthy diet and  regular cardiovascular exercise encouraged.   We will place referral to prep program.  Prediabetes Recent A1c 5.9%.  PCP to manage. Heart healthy diet and regular cardiovascular exercise encouraged.   Acute kidney injury Most recent serum creatinine 1.18 with a eGFR 52.  This was discovered in the hospital and she received IV fluids to help with this.  We will repeat BMET today to evaluate kidney function.    9.  Disposition: Follow-up with Dr. Margaretann Loveless in 3 months or sooner if anything changes. Bp at home  - not taking it. No sign of low bp. a   Medication Adjustments/Labs and Tests Ordered: Current medicines are reviewed at length with the patient today.  Concerns regarding medicines are outlined above.  Orders Placed This Encounter  Procedures   Basic metabolic panel   Amb Referral To Provider Referral Exercise Program (P.R.E.P)   Meds ordered this encounter  Medications   amLODipine (NORVASC) 5 MG tablet    Sig: Take 1 tablet (5 mg total) by mouth daily.    Dispense:  90 tablet    Refill:  0    IM program   atorvastatin (LIPITOR) 40 MG tablet    Sig: Take 1 tablet (40 mg total) by mouth daily.    Dispense:  90 tablet    Refill:  0   carvedilol (COREG) 6.25 MG tablet    Sig: Take 1 tablet (6.25 mg total) by mouth 2 (two) times daily with a meal.    Dispense:  60 tablet    Refill:  1   rivaroxaban (XARELTO) 20 MG TABS tablet    Sig: Take 1 tablet (20 mg total) by mouth daily with supper.    Dispense:  90  tablet    Refill:  0    IM program    Patient Instructions  Medication Instructions:  The current medical regimen is effective;  continue present plan and medications as directed. Please refer to the Current Medication list given to you today.  *If you need a refill on your cardiac medications before your next appointment, please call your pharmacy*   Lab Work: CMET TODAY If you have labs (blood work) drawn today and your tests are completely normal, you will receive  your results only by:  [place] (if you have MyChart) OR A paper copy in the mail  If you have any lab test that is abnormal or we need to change your treatment, we will call you to review the results.   Testing/Procedures: NONE  Follow-Up: At [facility], you and your health needs are our priority.  As part of our continuing mission to provide you with exceptional heart care, we have created designated Provider Care Teams.  These Care Teams include your primary Cardiologist (physician) and Advanced Practice Providers (APPs -  Physician Assistants and Nurse Practitioners) who all work together to provide you with the care you need, when you need it.  Your next appointment:   3 month(s)  The format for your next appointment:   In Person  Provider:   Elouise Munroe, MD     Other Instructions REFERRAL TO PREP [place], Finis Bud, NP  07/05/2022 5:10 PM    [place]

## 2022-07-05 ENCOUNTER — Other Ambulatory Visit (HOSPITAL_COMMUNITY): Payer: Self-pay

## 2022-07-05 ENCOUNTER — Ambulatory Visit: Payer: 59 | Attending: General Practice | Admitting: Nurse Practitioner

## 2022-07-05 ENCOUNTER — Encounter: Payer: Self-pay | Admitting: Nurse Practitioner

## 2022-07-05 VITALS — BP 104/66 | HR 68 | Ht 63.0 in | Wt 212.0 lb

## 2022-07-05 DIAGNOSIS — E669 Obesity, unspecified: Secondary | ICD-10-CM

## 2022-07-05 DIAGNOSIS — Z86711 Personal history of pulmonary embolism: Secondary | ICD-10-CM

## 2022-07-05 DIAGNOSIS — I5032 Chronic diastolic (congestive) heart failure: Secondary | ICD-10-CM

## 2022-07-05 DIAGNOSIS — Z86718 Personal history of other venous thrombosis and embolism: Secondary | ICD-10-CM

## 2022-07-05 DIAGNOSIS — N179 Acute kidney failure, unspecified: Secondary | ICD-10-CM | POA: Diagnosis not present

## 2022-07-05 DIAGNOSIS — E785 Hyperlipidemia, unspecified: Secondary | ICD-10-CM | POA: Diagnosis not present

## 2022-07-05 DIAGNOSIS — I10 Essential (primary) hypertension: Secondary | ICD-10-CM | POA: Diagnosis not present

## 2022-07-05 DIAGNOSIS — G4733 Obstructive sleep apnea (adult) (pediatric): Secondary | ICD-10-CM

## 2022-07-05 DIAGNOSIS — R7303 Prediabetes: Secondary | ICD-10-CM

## 2022-07-05 MED ORDER — AMLODIPINE BESYLATE 5 MG PO TABS
5.0000 mg | ORAL_TABLET | Freq: Every day | ORAL | 0 refills | Status: DC
  Filled 2022-07-05: qty 90, 90d supply, fill #0
  Filled 2022-09-16: qty 30, 30d supply, fill #0
  Filled 2022-10-15: qty 30, 30d supply, fill #1
  Filled 2022-11-13: qty 30, 30d supply, fill #2

## 2022-07-05 MED ORDER — CARVEDILOL 6.25 MG PO TABS
6.2500 mg | ORAL_TABLET | Freq: Two times a day (BID) | ORAL | 1 refills | Status: DC
  Filled 2022-07-05: qty 60, 30d supply, fill #0
  Filled 2022-08-13: qty 60, 30d supply, fill #1

## 2022-07-05 MED ORDER — RIVAROXABAN 20 MG PO TABS
20.0000 mg | ORAL_TABLET | Freq: Every day | ORAL | 0 refills | Status: DC
  Filled 2022-07-05: qty 30, 30d supply, fill #0
  Filled 2022-08-05: qty 30, 30d supply, fill #1
  Filled 2022-09-10 (×2): qty 30, 30d supply, fill #2

## 2022-07-05 MED ORDER — ATORVASTATIN CALCIUM 40 MG PO TABS
40.0000 mg | ORAL_TABLET | Freq: Every day | ORAL | 0 refills | Status: DC
  Filled 2022-07-05: qty 30, 30d supply, fill #0
  Filled 2022-09-10: qty 30, 30d supply, fill #1

## 2022-07-05 NOTE — Patient Instructions (Signed)
Medication Instructions:  The current medical regimen is effective;  continue present plan and medications as directed. Please refer to the Current Medication list given to you today.  *If you need a refill on your cardiac medications before your next appointment, please call your pharmacy*   Lab Work: CMET TODAY If you have labs (blood work) drawn today and your tests are completely normal, you will receive your results only by:  MyChart Message (if you have MyChart) OR A paper copy in the mail  If you have any lab test that is abnormal or we need to change your treatment, we will call you to review the results.   Testing/Procedures: NONE  Follow-Up: At [facility], you and your health needs are our priority.  As part of our continuing mission to provide you with exceptional heart care, we have created designated Provider Care Teams.  These Care Teams include your primary Cardiologist (physician) and Advanced Practice Providers (APPs -  Physician Assistants and Nurse Practitioners) who all work together to provide you with the care you need, when you need it.  Your next appointment:   3 month(s)  The format for your next appointment:   In Person  Provider:   Parke Poisson, MD     Other Instructions REFERRAL TO PREP PROGRAM-SOMEONE WILL BE CALLING YOU TO DISCUSS  TAKE AND LOG YOUR BLOOD PRESSURE  Important Information About [place]

## 2022-07-06 LAB — BASIC METABOLIC PANEL
BUN/Creatinine Ratio: 9 — ABNORMAL LOW (ref 12–28)
BUN: 9 mg/dL (ref 8–27)
CO2: 29 mmol/L (ref 20–29)
Calcium: 9.5 mg/dL (ref 8.7–10.3)
Chloride: 104 mmol/L (ref 96–106)
Creatinine, Ser: 0.97 mg/dL (ref 0.57–1.00)
Glucose: 98 mg/dL (ref 70–99)
Potassium: 3.7 mmol/L (ref 3.5–5.2)
Sodium: 145 mmol/L — ABNORMAL HIGH (ref 134–144)
eGFR: 65 mL/min/{1.73_m2} (ref >59)

## 2022-07-08 ENCOUNTER — Telehealth: Payer: Self-pay | Admitting: *Deleted

## 2022-07-08 NOTE — Progress Notes (Signed)
Internal Medicine Clinic Attending  Case discussed with the resident at the time of the visit.  We reviewed the resident's history and exam and pertinent patient test results.  I agree with the assessment, diagnosis, and plan of care documented in the resident's note.  

## 2022-07-08 NOTE — Telephone Encounter (Signed)
Contacted regarding PREP Class referral. Patient is interested in participating at the [facility].Marland Kitchen Understands that class availability will not be until beginning 2024. We will contact when this schedule is finalized.

## 2022-08-01 ENCOUNTER — Other Ambulatory Visit (HOSPITAL_COMMUNITY): Payer: Self-pay

## 2022-08-02 ENCOUNTER — Other Ambulatory Visit (HOSPITAL_COMMUNITY): Payer: Self-pay

## 2022-08-20 ENCOUNTER — Other Ambulatory Visit: Payer: Self-pay | Admitting: Internal Medicine

## 2022-08-20 DIAGNOSIS — I10 Essential (primary) hypertension: Secondary | ICD-10-CM

## 2022-09-06 ENCOUNTER — Other Ambulatory Visit (HOSPITAL_COMMUNITY): Payer: Self-pay

## 2022-09-06 ENCOUNTER — Other Ambulatory Visit: Payer: Self-pay

## 2022-09-10 ENCOUNTER — Other Ambulatory Visit (HOSPITAL_COMMUNITY): Payer: Self-pay

## 2022-09-10 ENCOUNTER — Telehealth: Payer: Self-pay

## 2022-09-10 ENCOUNTER — Other Ambulatory Visit: Payer: Self-pay

## 2022-09-10 DIAGNOSIS — Z86711 Personal history of pulmonary embolism: Secondary | ICD-10-CM

## 2022-09-10 NOTE — Telephone Encounter (Signed)
Patient is requesting a different medication if she is unable to get the Xarelto.    RTC to patient. Message left to call the Clinics.  Call to Pharmacy patient has a deductible that needs to be satisfied yearly with her insurance.  Will send [place] a message to see if there is any patient assistance available for patient.

## 2022-09-10 NOTE — Telephone Encounter (Signed)
Pt is requesting a call back .[name] She stated that her Pharmacist   told her that even with her insurance she is  having a copay of $500.. she also  requested that if she does not pick up you can go ahead and leave a message she will call back on her break

## 2022-09-12 ENCOUNTER — Other Ambulatory Visit (HOSPITAL_COMMUNITY): Payer: Self-pay

## 2022-09-13 ENCOUNTER — Other Ambulatory Visit (HOSPITAL_COMMUNITY): Payer: Self-pay

## 2022-09-13 NOTE — Telephone Encounter (Signed)
Left message for patient regarding Xarelto copay assistance.  Was unable to talk to [place] without patient with me.... left voicemail informing patient to call company [phone]) to get a replacement card or new card.

## 2022-09-16 ENCOUNTER — Telehealth: Payer: Self-pay | Admitting: *Deleted

## 2022-09-16 ENCOUNTER — Other Ambulatory Visit: Payer: Self-pay | Admitting: Internal Medicine

## 2022-09-16 ENCOUNTER — Other Ambulatory Visit (HOSPITAL_COMMUNITY): Payer: Self-pay

## 2022-09-16 ENCOUNTER — Other Ambulatory Visit: Payer: Self-pay | Admitting: Nurse Practitioner

## 2022-09-16 DIAGNOSIS — Z86711 Personal history of pulmonary embolism: Secondary | ICD-10-CM

## 2022-09-16 DIAGNOSIS — I10 Essential (primary) hypertension: Secondary | ICD-10-CM

## 2022-09-16 MED ORDER — RIVAROXABAN 20 MG PO TABS: 20.0000 mg | ORAL_TABLET | Freq: Every day | ORAL | 0 refills | Status: DC

## 2022-09-16 MED ORDER — CARVEDILOL 6.25 MG PO TABS
6.2500 mg | ORAL_TABLET | Freq: Two times a day (BID) | ORAL | 3 refills | Status: DC
  Filled 2022-09-16: qty 60, 30d supply, fill #0
  Filled 2022-10-21: qty 60, 30d supply, fill #1
  Filled 2022-11-19: qty 60, 30d supply, fill #2
  Filled 2022-12-17: qty 60, 30d supply, fill #3
  Filled 2023-01-21: qty 60, 30d supply, fill #4
  Filled 2023-02-17: qty 60, 30d supply, fill #5

## 2022-09-16 NOTE — Telephone Encounter (Signed)
Patient called in stating Coreg had been discontinued. Spoke with Jinny Blossom at [facility] and called in [place] as written on 08/21/22. That Rx was sent to [facility] but patient now has [employer]. Patient notified and is very Patent attorney.

## 2022-09-16 NOTE — Telephone Encounter (Signed)
Thank you Regino Schultze for assistance with this!

## 2022-09-16 NOTE — Telephone Encounter (Signed)
Cal; from patient requesting samples of Xarelto until she is able to get paperwork approved for assistance. Spoke with Dr. Jimmye Norman patient will be able to come in tho pick up Xarelto 20 mg samples #7.

## 2022-09-17 ENCOUNTER — Other Ambulatory Visit (HOSPITAL_COMMUNITY): Payer: Self-pay

## 2022-09-18 NOTE — Telephone Encounter (Signed)
Patient dropped off Producer, television/film/video for Xarelto patient assistance - included copy of insurance cards [employer] and [place].  Will attempt to enroll patient with company

## 2022-09-20 ENCOUNTER — Telehealth: Payer: Self-pay

## 2022-09-20 NOTE — Telephone Encounter (Signed)
Call to pt reference PREP referral.  Agreeable to starting on 10/21/22 1pm-215p MW  Will complete intake closer to start of class.  Patient has contact number.

## 2022-09-23 ENCOUNTER — Other Ambulatory Visit: Payer: Self-pay

## 2022-09-23 DIAGNOSIS — Z86711 Personal history of pulmonary embolism: Secondary | ICD-10-CM

## 2022-09-24 MED ORDER — RIVAROXABAN 20 MG PO TABS
20.0000 mg | ORAL_TABLET | Freq: Every day | ORAL | 3 refills | Status: DC
  Filled 2022-09-24: qty 30, 30d supply, fill #0

## 2022-09-25 ENCOUNTER — Other Ambulatory Visit (HOSPITAL_COMMUNITY): Payer: Self-pay

## 2022-09-25 NOTE — Telephone Encounter (Signed)
Submitted application for XARELTO to [employer]AN[employer] for patient assistance.   Phone: [phone]  

## 2022-10-01 ENCOUNTER — Ambulatory Visit (HOSPITAL_COMMUNITY)
Admission: RE | Admit: 2022-10-01 | Discharge: 2022-10-01 | Disposition: A | Discharge status: Home / Self Care | Payer: Medicare PPO | Source: Ambulatory Visit | Attending: Internal Medicine | Admitting: Internal Medicine

## 2022-10-01 ENCOUNTER — Ambulatory Visit (INDEPENDENT_AMBULATORY_CARE_PROVIDER_SITE_OTHER): Payer: Medicare PPO | Admitting: Internal Medicine

## 2022-10-01 VITALS — BP 138/81 | HR 76 | Temp 98.1°F | Ht 63.0 in | Wt 215.5 lb

## 2022-10-01 DIAGNOSIS — M79632 Pain in left forearm: Secondary | ICD-10-CM

## 2022-10-01 DIAGNOSIS — M7989 Other specified soft tissue disorders: Secondary | ICD-10-CM | POA: Insufficient documentation

## 2022-10-01 NOTE — Patient Instructions (Addendum)
Thank you, [name] for allowing [place] to provide your care today.  Blood clot: I have ordered a left upper extremity ultrasound, I am seeing if we can get that scheduled today. You have 3 weeks worth of samples of xarelto. I will reach out the the pharmacy tech today to see about the paperwork you mentioned. I will call you on Friday with an update about this.   Tests ordered today:  Left upper extremity ultrasound  I have ordered the following medication/changed the following medications:   Stop the following medications: There are no discontinued medications.   Start the following medications: No orders of the defined types were placed in this encounter.    Follow up: 3 weeks  We look forward to seeing you next time. Please call our clinic at [phone] if you have any questions or concerns. The best time to call is Monday-Friday from 9am-4pm, but there is someone available 24/7. If after hours or the weekend, call the main hospital number and ask for the Internal Medicine Resident On-Call. If you need medication refills, please notify your pharmacy one week in advance and they will send [place] a request.   Thank you for trusting me with your care. Wishing you the best!   Christiana Fuchs, [place]

## 2022-10-01 NOTE — Progress Notes (Unsigned)
Subjective:  CC: left arm swelling  HPI:  [name] is a 67 y.o. female with a past medical history stated below and presents today for swelling of left arm. 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  She takes Xarelto but has had difficulty affording medication recently.  She was given 7 days of Xarelto on 1/29 and ran out of medication 2/5.  For the last 3 to 4 days she has noticed worsening swelling in her left forearm. Please see problem based assessment and plan for additional details.  Past Medical History:  Diagnosis Date   (HFpEF) heart failure with preserved ejection fraction (HCC)    Acute kidney injury ([place])    Bronchitis due to COVID-19 virus 03/23/2019   DVT (deep venous thrombosis) (HCC)    Hyperlipidemia    Hypertension    Leg pain    right   Obesity (BMI 30-39.9)    OSA (obstructive sleep apnea)    PE (pulmonary embolism)    Prediabetes    Sleep apnea     Current Outpatient Medications on File Prior to Visit  Medication Sig Dispense Refill   amLODipine (NORVASC) 5 MG tablet Take 1 tablet (5 mg total) by mouth daily. 90 tablet 0   atorvastatin (LIPITOR) 40 MG tablet Take 1 tablet (40 mg total) by mouth daily. 90 tablet 0   carvedilol (COREG) 6.25 MG tablet TAKE 1 Tablet  BY MOUTH TWICE DAILY WITH A MEAL 180 tablet 3   carvedilol (COREG) 6.25 MG tablet Take 1 tablet (6.25 mg total) by mouth 2 (two) times daily with a meal. 180 tablet 3   chlorthalidone (HYGROTON) 25 MG tablet Take 0.5 tablets (12.5 mg total) by mouth daily. 45 tablet 3   fluticasone (FLONASE) 50 MCG/ACT nasal spray Place 1 spray into both nostrils daily. (Patient taking differently: Place 1 spray into both nostrils daily as needed for allergies.) 16 g 2   furosemide (LASIX) 20 MG tablet Take 1 tablet (20 mg total) by mouth daily as needed. (Patient taking differently: Take 20 mg by mouth daily as needed for fluid or edema.) 90 tablet 2   loratadine (CLARITIN) 10 MG  tablet Take 10 mg by mouth daily.     omeprazole (PRILOSEC) 20 MG capsule Take 2 capsules (40 mg total) by mouth daily. 180 capsule 3   rivaroxaban (XARELTO) 20 MG TABS tablet Take 1 tablet (20 mg total) by mouth daily with supper. 7 tablet 0   rivaroxaban (XARELTO) 20 MG TABS tablet Take 1 tablet (20 mg total) by mouth daily with supper. 90 tablet 3   No current facility-administered medications on file prior to visit.    Family History  Problem Relation Age of Onset   Hypertension Other    Heart disease Other    Diabetes Other    Coronary artery disease Other     Social History   Socioeconomic History   Marital status: Legally Separated    Spouse name: Not on file   Number of children: Not on file   Years of education: Not on file   Highest education level: Not on file  Occupational History   Not on file  Tobacco Use   Smoking status: Never   Smokeless tobacco: Never  Vaping Use   Vaping Use: Never used  Substance and Sexual Activity   Alcohol use: No   Drug use: Not on file   Sexual activity: Not Currently  Other Topics Concern  Not on file  Social History Narrative   Not on file   Social Determinants of Health   Financial Resource Strain: Medium Risk (07/02/2022)   Overall Financial Resource Strain (CARDIA)    Difficulty of Paying Living Expenses: Somewhat hard  Food Insecurity: No Food Insecurity (07/02/2022)   Hunger Vital Sign    Worried About Running Out of Food in the Last Year: Never true    Ran Out of Food in the Last Year: Never true  Transportation Needs: No Transportation Needs (07/02/2022)   PRAPARE - Hydrologist (Medical): No    Lack of Transportation (Non-Medical): No  Physical Activity: Inactive (07/02/2022)   Exercise Vital Sign    Days of Exercise per Week: 0 days    Minutes of Exercise per Session: 0 min  Stress: No Stress Concern Present (07/02/2022)   [place]    Feeling of Stress : Only a little  Social Connections: Moderately Integrated (07/02/2022)   Social Connection and Isolation Panel [NHANES]    Frequency of Communication with Friends and Family: Never    Frequency of Social Gatherings with Friends and Family: Once a week    Attends Religious Services: More than 4 times per year    Active Member of [employer] or Organizations: Yes    Attends Music therapist: More than 4 times per year    Marital Status: Married  Human resources officer Violence: Not At Risk (07/02/2022)   Humiliation, Afraid, Rape, and Kick questionnaire    Fear of Current or Ex-Partner: No    Emotionally Abused: No    Physically Abused: No    Sexually Abused: No    Review of Systems: ROS negative except for what is noted on the assessment and plan.  Objective:   Vitals:   10/01/22 0958 10/01/22 1042  BP: 138/81   Pulse: 76   Temp: 98.1 F (36.7 C)   TempSrc: Oral   SpO2: 96% 96%  Weight: 215 lb 8 oz (97.8 kg)   Height: 5' 3"$  (1.6 m)    Ambulatory saturations:  O2 greater than 96% with sitting and ambulation  Physical Exam: Constitutional: well-appearing  Cardiovascular: regular rate and rhythm, no m/r/g Pulmonary/Chest: normal work of breathing on room air, lungs clear to auscultation bilaterally MSK: 1+ non pitting edema in left forearm, tenderness present, no erythema to palpation noted   Assessment & Plan:  Pain and swelling of left forearm Patient has history of multiple DVT/ PE dating back to 2012. Initially episode was thought to provoked in setting of recent hormone therapy and she completed 3 months of warfarin in 2012. After that medication was discontinued and she had another DVT and placed on life long anticoagulation. She was tested for factor V leiden in 2012 and this was negative.  A prophylactic IVC filter was placed in 2012 prior to a spinal surgery and I do not see documentation that this was removed. She has  history of pain and swelling in arm with most recent episode 08/2020. In clinic [place] consistent with DVT of left forearm.  She presents with history of 7 days of swelling and pain in left forearm. Wells score elevated at 3 with tenderness, swelling and prior DVT.  A/P: Wells score places her at high risk, Ultrasound was ordered for left upper extremity. This showed no evidence of DVT or superficial vein thrombosis, there was a complex focal collection in lateral distal forearm with  vasculature. I reached out to Dr. Donzetta Matters with vascular surgery who interpreted the U/S. He was not able to discern further about differentials for complex focal collection. I am wondering if complex collection is due to prior DVTs.  I called and reviewed result with patient. She has follow-up 3/5 with Dr. Humphrey Rolls. If she continues to have swelling and pain at follow-up would consider MRI to characterize vasculature better. Could also consider doing ultrasound in clinic. She was given 21 tablets of Xarelto in clinic. I sent message to Zane Herald for help with patient assistance. Patient has turned in paperwork and I did not see paperwork in Team workbox.    Patient discussed with Dr. Juluis Rainier Blythe Hartshorn, D.O. [facility]  PGY-2 Pager: [phone]  Phone: [phone] Date 10/02/2022  Time 4:39 PM

## 2022-10-02 DIAGNOSIS — M7989 Other specified soft tissue disorders: Secondary | ICD-10-CM | POA: Insufficient documentation

## 2022-10-02 DIAGNOSIS — M79632 Pain in left forearm: Secondary | ICD-10-CM | POA: Insufficient documentation

## 2022-10-02 HISTORY — DX: Other specified soft tissue disorders: M79.89

## 2022-10-02 NOTE — Assessment & Plan Note (Signed)
Patient has history of multiple DVT/ PE dating back to 2012. Initially episode was thought to provoked in setting of recent hormone therapy and she completed 3 months of warfarin in 2012. After that medication was discontinued and she had another DVT and placed on life long anticoagulation. She was tested for factor V leiden in 2012 and this was negative.  A prophylactic IVC filter was placed in 2012 prior to a spinal surgery and I do not see documentation that this was removed. She has history of pain and swelling in arm with most recent episode 08/2020. In clinic [place] consistent with DVT of left forearm.  She presents with history of 7 days of swelling and pain in left forearm. Wells score elevated at 3 with tenderness, swelling and prior DVT.  A/P: Wells score places her at high risk, Ultrasound was ordered for left upper extremity. This showed no evidence of DVT or superficial vein thrombosis, there was a complex focal collection in lateral distal forearm with vasculature. I reached out to Dr. Donzetta Matters with vascular surgery who interpreted the U/S. He was not able to discern further about differentials for complex focal collection. I am wondering if complex collection is due to prior DVTs.  I called and reviewed result with patient. She has follow-up 3/5 with Dr. Humphrey Rolls. If she continues to have swelling and pain at follow-up would consider MRI to characterize vasculature better. Could also consider doing ultrasound in clinic. She was given 21 tablets of Xarelto in clinic. I sent message to Zane Herald for help with patient assistance. Patient has turned in paperwork and I did not see paperwork in Team workbox.

## 2022-10-03 NOTE — Progress Notes (Signed)
Internal Medicine Clinic Attending  Case discussed with the resident at the time of the visit.  We reviewed the resident's history and exam and pertinent patient test results.  I agree with the assessment, diagnosis, and plan of care documented in the resident's note.  

## 2022-10-07 ENCOUNTER — Other Ambulatory Visit (HOSPITAL_COMMUNITY): Payer: Self-pay

## 2022-10-07 NOTE — Progress Notes (Signed)
Internal Medicine Clinic Attending  Case and documentation of Dr. Howie Ill  at the time of the visit reviewed.  I reviewed the AWV findings.  I agree with the assessment, diagnosis, and plan of care documented in the AWV note.

## 2022-10-08 ENCOUNTER — Encounter: Payer: Self-pay | Admitting: Internal Medicine

## 2022-10-08 ENCOUNTER — Ambulatory Visit: Payer: Medicare PPO | Attending: Internal Medicine | Admitting: Internal Medicine

## 2022-10-08 ENCOUNTER — Other Ambulatory Visit (HOSPITAL_COMMUNITY): Payer: Self-pay

## 2022-10-08 VITALS — BP 128/78 | HR 75 | Ht 63.0 in | Wt 212.0 lb

## 2022-10-08 DIAGNOSIS — R7303 Prediabetes: Secondary | ICD-10-CM

## 2022-10-08 DIAGNOSIS — R0789 Other chest pain: Secondary | ICD-10-CM

## 2022-10-08 DIAGNOSIS — E785 Hyperlipidemia, unspecified: Secondary | ICD-10-CM | POA: Diagnosis not present

## 2022-10-08 DIAGNOSIS — I5032 Chronic diastolic (congestive) heart failure: Secondary | ICD-10-CM | POA: Diagnosis not present

## 2022-10-08 DIAGNOSIS — I10 Essential (primary) hypertension: Secondary | ICD-10-CM

## 2022-10-08 DIAGNOSIS — Z86711 Personal history of pulmonary embolism: Secondary | ICD-10-CM | POA: Diagnosis not present

## 2022-10-08 DIAGNOSIS — Z86718 Personal history of other venous thrombosis and embolism: Secondary | ICD-10-CM | POA: Diagnosis not present

## 2022-10-08 DIAGNOSIS — G4733 Obstructive sleep apnea (adult) (pediatric): Secondary | ICD-10-CM | POA: Diagnosis not present

## 2022-10-08 DIAGNOSIS — E669 Obesity, unspecified: Secondary | ICD-10-CM

## 2022-10-08 NOTE — Patient Instructions (Signed)
Medication Instructions:  No Changes In Medications at this time.  *If you need a refill on your cardiac medications before your next appointment, please call your pharmacy*  Follow-Up: At [facility], you and your health needs are our priority.  As part of our continuing mission to provide you with exceptional heart care, we have created designated Provider Care Teams.  These Care Teams include your primary Cardiologist (physician) and Advanced Practice Providers (APPs -  Physician Assistants and Nurse Practitioners) who all work together to provide you with the care you need, when you need it.   Your next appointment:   1 year(s)  Provider:   Elouise Munroe, MD

## 2022-10-08 NOTE — Progress Notes (Signed)
Cardiology Office Note:    Date:  10/08/2022  ID:  [name], DOB [birth year], MRN [id number]  PCP:  Christiana Fuchs, DO  Cardiologist:  Elouise Munroe, MD  Electrophysiologist:  None   Referring MD: Christiana Fuchs, DO   Chief Complaint/Reason for Referral: Follow Up chest pain  History of Present Illness:    [name] is a 67 y.o. female with a history of Hypertension, DVT/PE on Xarelto, IVC filter, HFpEF, OSA with no cpap, Hyperlipidemia, Obesity, Prediabetes, Depression and Chronic pain here for follow up.  She presented to the hospital on 06/22/2022 with sudden onset of chest pain and described the pain as "someone sitting on her chest". She had an associated pain radiating to her left arm and back as well as some diaphoresis. EMS administered nitroglycerin and aspirin which improved her symptoms. Her blood pressure in the ED was 104/55. Troponins were unremarkable, Echo revealed LVEF 55-60%, no RWMA, normal RV function, mild LVH, normal PASP. Coronary CTA revealed calcium score of 0 with no evidence of CAD.   At her last visit 07/05/2022 with Finis Bud, NP she denied any cardiac complications and was setting up a repeat sleep study for her OSA. She did complain of chronic low back pain which limited her physical activity.  Today, the patient states that she has been feeling well since her visit 07/05/2022. She had has no recurring episodes since her initial admission. Her left arm swelling had improved since she had gotten back on Xarelto which has been cost prohibitive which led to her missing doses, was out of samples. She has been compliant with her other medications.   She denies any palpitations, chest pain, shortness of breath, or peripheral edema. No lightheadedness, headaches, syncope, orthopnea, or PND.   Past Medical History:  Diagnosis Date   (HFpEF) heart failure with preserved ejection fraction (HCC)    Acute kidney injury ([place])    Bronchitis due  to COVID-19 virus 03/23/2019   DVT (deep venous thrombosis) (HCC)    Hyperlipidemia    Hypertension    Leg pain    right   Obesity (BMI 30-39.9)    OSA (obstructive sleep apnea)    PE (pulmonary embolism)    Prediabetes    Sleep apnea     Past Surgical History:  Procedure Laterality Date   ABDOMINAL HYSTERECTOMY     APPENDECTOMY     BACK SURGERY     CHOLECYSTECTOMY     Gall Bladder removal   COLONOSCOPY WITH PROPOFOL N/A 05/06/2017   Procedure: COLONOSCOPY WITH PROPOFOL;  Surgeon: Wilford Corner, MD;  Location: [facility];  Service: Endoscopy;  Laterality: N/A;   LAMINOTOMY     right at L4-5 with a disc bulge and superimposed right lateral recess protrusion encroaching on the L5 root   SPINE SURGERY     decompression surgery    Current Medications: Current Meds  Medication Sig   amLODipine (NORVASC) 5 MG tablet Take 1 tablet (5 mg total) by mouth daily.   atorvastatin (LIPITOR) 40 MG tablet Take 1 tablet (40 mg total) by mouth daily.   carvedilol (COREG) 6.25 MG tablet TAKE 1 Tablet  BY MOUTH TWICE DAILY WITH A MEAL   carvedilol (COREG) 6.25 MG tablet Take 1 tablet (6.25 mg total) by mouth 2 (two) times daily with a meal.   chlorthalidone (HYGROTON) 25 MG tablet Take 0.5 tablets (12.5 mg total) by mouth daily.   fluticasone (FLONASE) 50 MCG/ACT nasal spray Place 1 spray into  both nostrils daily. (Patient taking differently: Place 1 spray into both nostrils daily as needed for allergies.)   furosemide (LASIX) 20 MG tablet Take 1 tablet (20 mg total) by mouth daily as needed. (Patient taking differently: Take 20 mg by mouth daily as needed for fluid or edema.)   loratadine (CLARITIN) 10 MG tablet Take 10 mg by mouth daily.   omeprazole (PRILOSEC) 20 MG capsule Take 2 capsules (40 mg total) by mouth daily.   rivaroxaban (XARELTO) 20 MG TABS tablet Take 1 tablet (20 mg total) by mouth daily with supper.   rivaroxaban (XARELTO) 20 MG TABS tablet Take 1 tablet (20 mg total) by  mouth daily with supper.     Allergies:   Tramadol and Ciprofloxacin   Social History   Tobacco Use   Smoking status: Never   Smokeless tobacco: Never  Vaping Use   Vaping Use: Never used  Substance Use Topics   Alcohol use: No     Family History: The patient's family history includes Coronary artery disease in an other family member; Diabetes in an other family member; Heart disease in an other family member; Hypertension in an other family member.  ROS:   Please see the history of present illness.    All other systems reviewed and are negative.  EKGs/Labs/Other Studies Reviewed:    The following studies were reviewed today:  EKG:  The EKG is personally reviewed 10/08/2022: The EKG is not ordered today.  Imaging studies that I have independently reviewed today: n/a  Recent Labs: 06/22/2022: B Natriuretic Peptide 42.2 06/23/2022: Magnesium 2.1; TSH 1.119 06/24/2022: Hemoglobin 13.0; Platelets 228 07/05/2022: BUN 9; Creatinine, Ser 0.97; Potassium 3.7; Sodium 145  Recent Lipid Panel    Component Value Date/Time   CHOL 122 06/23/2022 0606   CHOL 123 11/22/2019 0927   TRIG 75 06/23/2022 0606   HDL 39 (L) 06/23/2022 0606   HDL 44 11/22/2019 0927   CHOLHDL 3.1 06/23/2022 0606   VLDL 15 06/23/2022 0606   LDLCALC 68 06/23/2022 0606   LDLCALC 64 11/22/2019 0927    Physical Exam:    VS:  BP 128/78   Pulse 75   Ht 5' 3"$  (1.6 m)   Wt 212 lb (96.2 kg)   SpO2 98%   BMI 37.55 kg/m     Wt Readings from Last 5 Encounters:  10/08/22 212 lb (96.2 kg)  10/01/22 215 lb 8 oz (97.8 kg)  07/05/22 212 lb (96.2 kg)  07/02/22 214 lb (97.1 kg)  07/02/22 214 lb (97.1 kg)    Constitutional: No acute distress Eyes: sclera non-icteric, normal conjunctiva and lids ENMT: normal dentition, moist mucous membranes Cardiovascular: regular rhythm, normal rate, no murmur. S1 and S2 normal. No jugular venous distention.  Respiratory: clear to auscultation bilaterally GI : normal bowel  sounds, soft and nontender. No distention.   MSK: extremities warm, well perfused. No edema.  NEURO: grossly nonfocal exam, moves all extremities. PSYCH: alert and oriented x 3, normal mood and affect.   ASSESSMENT:    1. Chronic diastolic heart failure ([place])   2. Hyperlipidemia, unspecified hyperlipidemia type   3. Essential hypertension   4. History of pulmonary embolus (PE)   5. History of DVT (deep vein thrombosis)   6. OSA (obstructive sleep apnea)   7. Obesity (BMI 30-39.9)   8. Prediabetes   9. Atypical chest pain    PLAN:    Chronic diastolic heart failure (HCC) - cont prn lasix, euvolemic today.   Hyperlipidemia, unspecified  hyperlipidemia type - lipids optimized, cont atorvastatin 40 mg daily, LDL 68  Essential hypertension - well controlled continue amlodipine 5 mg daily, carvedilol 6.25 mg BID, chlorthalidone 12.5 mg daily, prn lasix.  History of pulmonary embolus (PE) History of DVT (deep vein thrombosis) - recent workup of LUE swelling, improving on resumed xarelto. No dvt on u/s.   Atypical chest pain - resolved.  Follow Up: 1 Year  Total time of encounter: 25 minutes total time of encounter, including 15 minutes spent in face-to-face patient care on the date of this encounter. This time includes coordination of care and counseling regarding above mentioned problem list. Remainder of non-face-to-face time involved reviewing chart documents/testing relevant to the patient encounter and documentation in the medical record. I have independently reviewed documentation from referring provider.  Cherlynn Kaiser, MD, [place]   Shared Decision Making/Informed Consent:       Medication Adjustments/Labs and Tests Ordered: Current medicines are reviewed at length with the patient today.  Concerns regarding medicines are outlined above.   No orders of the defined types were placed in this encounter.   No orders of the defined types were  placed in this encounter.   Patient Instructions  Medication Instructions:  No Changes In Medications at this time.  *If you need a refill on your cardiac medications before your next appointment, please call your pharmacy*  Follow-Up: At [facility], you and your health needs are our priority.  As part of our continuing mission to provide you with exceptional heart care, we have created designated Provider Care Teams.  These Care Teams include your primary Cardiologist (physician) and Advanced Practice Providers (APPs -  Physician Assistants and Nurse Practitioners) who all work together to provide you with the care you need, when you need it.   Your next appointment:   1 year(s)  Provider:   Elouise Munroe, MD      I,Coren O'Brien,acting as a scribe for Elouise Munroe, MD.,have documented all relevant documentation on the behalf of Elouise Munroe, MD,as directed by  Elouise Munroe, MD while in the presence of Elouise Munroe, MD.  I, Elouise Munroe, MD, have reviewed all documentation for this visit. The documentation on 10/08/22 for the exam, diagnosis, procedures, and orders are all accurate and complete.

## 2022-10-14 ENCOUNTER — Telehealth: Payer: Self-pay

## 2022-10-14 NOTE — Telephone Encounter (Signed)
VMT pt ref next PREP class starting on 10/28/22.  Request call back to discuss intake appt.

## 2022-10-21 NOTE — Progress Notes (Unsigned)
CC: 3 week follow up on Upper extremity swelling  HPI:  [name] is a 67 y.o. with medical history of HTN, HLD, Class II obesity, HFpEF, hx of PE and DVT presenting to [facility] for follow up upper extremity swelling concerning for DVT. Ultrasound of upper extremity was obtained that and was negative.   Please see problem-based list for further details, assessments, and plans.  Past Medical History:  Diagnosis Date   (HFpEF) heart failure with preserved ejection fraction (HCC)    Acute kidney injury ([place])    Bronchitis due to COVID-19 virus 03/23/2019   DVT (deep venous thrombosis) (HCC)    Hyperlipidemia    Hypertension    Leg pain    right   Obesity (BMI 30-39.9)    OSA (obstructive sleep apnea)    PE (pulmonary embolism)    Prediabetes    Sleep apnea    Trigger finger, left ring finger 01/04/2022     Current Outpatient Medications (Cardiovascular):    amLODipine (NORVASC) 5 MG tablet, Take 1 tablet (5 mg total) by mouth daily.   atorvastatin (LIPITOR) 40 MG tablet, Take 1 tablet (40 mg total) by mouth daily.   carvedilol (COREG) 6.25 MG tablet, TAKE 1 Tablet  BY MOUTH TWICE DAILY WITH A MEAL   carvedilol (COREG) 6.25 MG tablet, Take 1 tablet (6.25 mg total) by mouth 2 (two) times daily with a meal.   chlorthalidone (HYGROTON) 25 MG tablet, Take 0.5 tablets (12.5 mg total) by mouth daily.   furosemide (LASIX) 20 MG tablet, Take 1 tablet (20 mg total) by mouth daily as needed. (Patient taking differently: Take 20 mg by mouth daily as needed for fluid or edema.)  Current Outpatient Medications (Respiratory):    fluticasone (FLONASE) 50 MCG/ACT nasal spray, Place 1 spray into both nostrils daily. (Patient taking differently: Place 1 spray into both nostrils daily as needed for allergies.)   loratadine (CLARITIN) 10 MG tablet, Take 10 mg by mouth daily.   Current Outpatient Medications (Hematological):    rivaroxaban (XARELTO) 20 MG TABS tablet, Take 1 tablet (20 mg  total) by mouth daily with supper.  Current Outpatient Medications (Other):    omeprazole (PRILOSEC) 20 MG capsule, Take 2 capsules (40 mg total) by mouth daily.  Review of Systems:  Review of system negative unless stated in the problem list or HPI.    Physical Exam:  Vitals:   10/22/22 0958  BP: (!) 127/59  Pulse: 77  Resp: (!) 28  Temp: 98.5 F (36.9 C)  TempSrc: Oral  SpO2: 96%  Weight: 214 lb 1.6 oz (97.1 kg)  Height: '5\' 3"'$  (1.6 m)   Physical Exam General: NAD HENT: NCAT Lungs: CTAB, no wheeze, rhonchi or rales.  Cardiovascular: Normal heart sounds, no r/m/g, 2+ pulses in all extremities. No LE edema Abdomen: No TTP, normal bowel sounds MSK: No muscle atrophy. Small palpable nodule in medial aspect of the left upper extremity. No unilateral swelling.  Skin: no lesions noted on exposed skin Neuro: Alert and oriented x4. CN grossly intact Psych: Normal mood and normal affect   Assessment & Plan:   Essential hypertension Well controlled. On Amlodipine 5 mg qd, Coreg 6.25 mg BID, Chlorthalidone 12.5 mg qd. Normal BMP in 06/2022. Will continue current regimen.   GERD (gastroesophageal reflux disease) Pt has GERD, on prilosec 40 mg qd. States symptoms are controlled. Will plan to titrate off PPI and monitor symptoms. If symptoms persistent, then will order endoscopy given pt is over  62 and new GERD.   Pain and swelling of left forearm Patient reports her swelling and pain in the left forearm has improved.  On exam, small nodule palpated but pt states it has decreased from previously. It it slightly tender to palpation. Advised elevation and warm compresses. Could be a hematoma given pt is on [facility]. Reassured it was acute onset, negative ultrasound, and that it is resolving. Will get MRI of Soft Tissue if not resolved at 4 week follow up.   SLEEP APNEA, OBSTRUCTIVE Spoke with Ms. Chilon on scheduling pt's sleep study. Will get this arranged for the patient. Appears sleep  center has not been able to successfully reach the patient.   History of pulmonary embolus (PE) Pt on Xarelto for chronic AC. Will give pt 2 week sample and work on getting her prescription for Xarelto. Appears pt recently got approved for medicare so will work with our pharmacy tech to approved.   Addendum: Pt's insurance covers Xarelto for $4 for 30 day supply.   Healthcare maintenance Will follow up on sleep study, PFTs and MM order that is already placed.    See Encounters Tab for problem based charting.  Patient Discussed with Dr. Odella Aquas, MD Tillie Rung. [facility] Internal Medicine Residency, PGY-2

## 2022-10-22 ENCOUNTER — Other Ambulatory Visit: Payer: Self-pay | Admitting: Internal Medicine

## 2022-10-22 ENCOUNTER — Other Ambulatory Visit (HOSPITAL_COMMUNITY): Payer: Self-pay

## 2022-10-22 ENCOUNTER — Ambulatory Visit (INDEPENDENT_AMBULATORY_CARE_PROVIDER_SITE_OTHER): Payer: Medicare PPO | Admitting: Internal Medicine

## 2022-10-22 ENCOUNTER — Encounter: Payer: Self-pay | Admitting: Internal Medicine

## 2022-10-22 VITALS — BP 127/59 | HR 77 | Temp 98.5°F | Resp 28 | Ht 63.0 in | Wt 214.1 lb

## 2022-10-22 DIAGNOSIS — M79632 Pain in left forearm: Secondary | ICD-10-CM | POA: Diagnosis not present

## 2022-10-22 DIAGNOSIS — G4733 Obstructive sleep apnea (adult) (pediatric): Secondary | ICD-10-CM

## 2022-10-22 DIAGNOSIS — Z86711 Personal history of pulmonary embolism: Secondary | ICD-10-CM

## 2022-10-22 DIAGNOSIS — K219 Gastro-esophageal reflux disease without esophagitis: Secondary | ICD-10-CM

## 2022-10-22 DIAGNOSIS — I10 Essential (primary) hypertension: Secondary | ICD-10-CM

## 2022-10-22 DIAGNOSIS — M7989 Other specified soft tissue disorders: Secondary | ICD-10-CM

## 2022-10-22 DIAGNOSIS — Z0000 Encounter for general adult medical examination without abnormal findings: Secondary | ICD-10-CM

## 2022-10-22 MED ORDER — RIVAROXABAN 20 MG PO TABS: 20.0000 mg | ORAL_TABLET | Freq: Every day | ORAL | 3 refills | Status: DC

## 2022-10-22 MED ORDER — RIVAROXABAN 20 MG PO TABS
20.0000 mg | ORAL_TABLET | Freq: Every day | ORAL | 3 refills | Status: DC
  Filled 2022-10-22: qty 90, 90d supply, fill #0
  Filled 2023-02-10: qty 90, 90d supply, fill #1
  Filled 2023-05-07: qty 90, 90d supply, fill #2

## 2022-10-22 MED ORDER — ATORVASTATIN CALCIUM 40 MG PO TABS
40.0000 mg | ORAL_TABLET | Freq: Every day | ORAL | 0 refills | Status: DC
  Filled 2022-10-22: qty 30, 30d supply, fill #0
  Filled 2022-11-28: qty 30, 30d supply, fill #1
  Filled 2023-01-03: qty 30, 30d supply, fill #2

## 2022-10-22 NOTE — Assessment & Plan Note (Addendum)
Pt has GERD, on prilosec 40 mg qd. States symptoms are controlled. Will plan to titrate off PPI and monitor symptoms. If symptoms persistent, then will order endoscopy given pt is over 62 and new GERD.

## 2022-10-22 NOTE — Assessment & Plan Note (Signed)
Will follow up on sleep study, PFTs and MM order that is already placed.

## 2022-10-22 NOTE — Telephone Encounter (Signed)
Reached out to [place] to follow up on enrollment. Case was previously closed due to patients insurance (at the time) being inactive.   Patient has new coverage [facility]). Ran test claim thru our pharmacy and copay for 30 day supply is $4.60

## 2022-10-22 NOTE — Assessment & Plan Note (Signed)
Patient reports her swelling and pain in the left forearm has improved.  On exam, small nodule palpated but pt states it has decreased from previously. It it slightly tender to palpation. Advised elevation and warm compresses. Could be a hematoma given pt is on [facility]. Reassured it was acute onset, negative ultrasound, and that it is resolving. Will get MRI of Soft Tissue if not resolved at 4 week follow up.

## 2022-10-22 NOTE — Assessment & Plan Note (Signed)
Well controlled. On Amlodipine 5 mg qd, Coreg 6.25 mg BID, Chlorthalidone 12.5 mg qd. Normal BMP in 06/2022. Will continue current regimen.

## 2022-10-22 NOTE — Assessment & Plan Note (Addendum)
Pt on Xarelto for chronic AC. Will give pt 2 week sample and work on getting her prescription for Xarelto. Appears pt recently got approved for medicare so will work with our pharmacy tech to approved.   Addendum: Pt's insurance covers Xarelto for $4 for 30 day supply.

## 2022-10-22 NOTE — Assessment & Plan Note (Signed)
Spoke with Ms. Chilon on scheduling pt's sleep study. Will get this arranged for the patient. Appears sleep center has not been able to successfully reach the patient.

## 2022-10-22 NOTE — Patient Instructions (Signed)
Ms.Corina L Thorup, it was a pleasure seeing you today! You endorsed feeling well today. Below are some of the things we talked about this visit. We look forward to seeing you in the follow up appointment!  Today we discussed: We will refill your xarelto. I gave you samples until the refill gets processed.  For your arm swelling, please elevated the arm and use heat on it. If you notice it getting bigger call our clinic. It is improving. I will follow up referral to sleep study, and pulmonary function testing.  Their is order for mammogram that I will follow up on.     I have ordered the following labs today:  Lab Orders  No laboratory test(s) ordered today      Referrals ordered today:   Referral Orders  No referral(s) requested today     I have ordered the following medication/changed the following medications:   Stop the following medications: Medications Discontinued During This Encounter  Medication Reason   atorvastatin (LIPITOR) 40 MG tablet Reorder     Start the following medications: Meds ordered this encounter  Medications   atorvastatin (LIPITOR) 40 MG tablet    Sig: Take 1 tablet (40 mg total) by mouth daily.    Dispense:  90 tablet    Refill:  0     Follow-up: 1 month follow up  Please make sure to arrive 15 minutes prior to your next appointment. If you arrive late, you may be asked to reschedule.   We look forward to seeing you next time. Please call our clinic at [phone] if you have any questions or concerns. The best time to call is Monday-Friday from 9am-4pm, but there is someone available 24/7. If after hours or the weekend, call the main hospital number and ask for the Internal Medicine Resident On-Call. If you need medication refills, please notify your pharmacy one week in advance and they will send [place] a request.  Thank you for letting us take part in your care. Wishing you the best!  Thank you, Idamae Schuller, MD

## 2022-10-29 ENCOUNTER — Telehealth: Payer: Self-pay | Admitting: *Deleted

## 2022-10-29 ENCOUNTER — Ambulatory Visit (HOSPITAL_COMMUNITY)
Admission: RE | Admit: 2022-10-29 | Discharge: 2022-10-29 | Disposition: A | Discharge status: Home / Self Care | Payer: Medicare PPO | Source: Ambulatory Visit | Attending: Student in an Organized Health Care Education/Training Program | Admitting: Student in an Organized Health Care Education/Training Program

## 2022-10-29 DIAGNOSIS — R053 Chronic cough: Secondary | ICD-10-CM

## 2022-10-29 LAB — PULMONARY FUNCTION TEST
DL/VA % pred: 101 %
DL/VA: 4.26 ml/min/mmHg/L
DLCO unc % pred: 70 %
DLCO unc: 13.47 ml/min/mmHg
FEF 25-75 Post: 1.66 L/sec
FEF 25-75 Pre: 1.22 L/sec
FEF2575-%Change-Post: 35 %
FEF2575-%Pred-Post: 79 %
FEF2575-%Pred-Pre: 58 %
FEV1-%Change-Post: 8 %
FEV1-%Pred-Post: 65 %
FEV1-%Pred-Pre: 60 %
FEV1-Post: 1.52 L
FEV1-Pre: 1.4 L
FEV1FVC-%Change-Post: 5 %
FEV1FVC-%Pred-Pre: 99 %
FEV6-%Change-Post: 2 %
FEV6-%Pred-Post: 64 %
FEV6-%Pred-Pre: 62 %
FEV6-Post: 1.88 L
FEV6-Pre: 1.83 L
FEV6FVC-%Pred-Post: 104 %
FEV6FVC-%Pred-Pre: 104 %
FVC-%Change-Post: 3 %
FVC-%Pred-Post: 62 %
FVC-%Pred-Pre: 60 %
FVC-Post: 1.89 L
FVC-Pre: 1.83 L
Post FEV1/FVC ratio: 81 %
Post FEV6/FVC ratio: 100 %
Pre FEV1/FVC ratio: 77 %
Pre FEV6/FVC Ratio: 100 %
RV % pred: 330 %
RV: 6.73 L
TLC % pred: 176 %
TLC: 8.66 L

## 2022-10-29 MED ORDER — ALBUTEROL SULFATE (2.5 MG/3ML) 0.083% IN NEBU
2.5000 mg | INHALATION_SOLUTION | Freq: Once | RESPIRATORY_TRACT | Status: AC
  Administered 2022-10-29: 2.5 mg via RESPIRATORY_TRACT

## 2022-10-29 NOTE — Telephone Encounter (Signed)
Call from patient unable to keep appointment for her PFT today at 11:00 AM.  Unable to have Jeannette park her care when she arrived at 10:40 AM today.  Was told that her car could nod be parked since she has no Handicap sticker.  Patient stated unable to find parking in the parking lot today.  Would like to reschedule the appointment for the PFT's.  Call to Respiratory to try to reschedule patient appointment.  Message was left for Respiratory to call to reschedule the appointment.     RTC to Respiratory Therapy patient will be able to have PFT's done today.     Patient was called and will return to hospital to have her PFT's done today.

## 2022-10-31 NOTE — Progress Notes (Signed)
Internal Medicine Clinic Attending  Case discussed with Dr. Khan  at the time of the visit.  We reviewed the resident's history and exam and pertinent patient test results.  I agree with the assessment, diagnosis, and plan of care documented in the resident's note.  

## 2022-11-22 ENCOUNTER — Telehealth: Payer: Self-pay

## 2022-11-22 NOTE — Telephone Encounter (Signed)
VMT pt requesting call back reference starting PREP on 12/02/22.

## 2022-12-11 ENCOUNTER — Other Ambulatory Visit: Payer: Self-pay | Admitting: Nurse Practitioner

## 2022-12-11 ENCOUNTER — Other Ambulatory Visit (HOSPITAL_COMMUNITY): Payer: Self-pay

## 2022-12-11 DIAGNOSIS — I10 Essential (primary) hypertension: Secondary | ICD-10-CM

## 2022-12-11 MED ORDER — AMLODIPINE BESYLATE 5 MG PO TABS
5.0000 mg | ORAL_TABLET | Freq: Every day | ORAL | 3 refills | Status: DC
  Filled 2022-12-11: qty 30, 30d supply, fill #0
  Filled 2023-01-10: qty 30, 30d supply, fill #1
  Filled 2023-02-10: qty 30, 30d supply, fill #2
  Filled 2023-03-10: qty 30, 30d supply, fill #3
  Filled 2023-04-14: qty 30, 30d supply, fill #4
  Filled 2023-05-13: qty 30, 30d supply, fill #5

## 2023-01-03 ENCOUNTER — Other Ambulatory Visit (HOSPITAL_COMMUNITY): Payer: Self-pay

## 2023-01-24 ENCOUNTER — Telehealth: Payer: Self-pay

## 2023-01-24 NOTE — Telephone Encounter (Signed)
VMT pt requesting call back to discuss starting PREP 

## 2023-02-10 ENCOUNTER — Other Ambulatory Visit (HOSPITAL_COMMUNITY): Payer: Self-pay

## 2023-02-10 ENCOUNTER — Other Ambulatory Visit: Payer: Self-pay

## 2023-02-11 ENCOUNTER — Ambulatory Visit (INDEPENDENT_AMBULATORY_CARE_PROVIDER_SITE_OTHER): Payer: Medicare PPO

## 2023-02-11 ENCOUNTER — Other Ambulatory Visit (HOSPITAL_COMMUNITY): Payer: Self-pay

## 2023-02-11 VITALS — BP 128/62 | HR 86 | Temp 98.5°F | Ht 63.0 in | Wt 215.4 lb

## 2023-02-11 DIAGNOSIS — J449 Chronic obstructive pulmonary disease, unspecified: Secondary | ICD-10-CM

## 2023-02-11 DIAGNOSIS — J069 Acute upper respiratory infection, unspecified: Secondary | ICD-10-CM

## 2023-02-11 DIAGNOSIS — L0291 Cutaneous abscess, unspecified: Secondary | ICD-10-CM

## 2023-02-11 MED ORDER — ALBUTEROL SULFATE HFA 108 (90 BASE) MCG/ACT IN AERS
2.0000 | INHALATION_SPRAY | Freq: Four times a day (QID) | RESPIRATORY_TRACT | 2 refills | Status: AC | PRN
  Filled 2023-02-11: qty 6.7, 25d supply, fill #0

## 2023-02-11 MED ORDER — BENZONATATE 100 MG PO CAPS
200.0000 mg | ORAL_CAPSULE | Freq: Three times a day (TID) | ORAL | 0 refills | Status: DC | PRN
  Filled 2023-02-11: qty 20, 4d supply, fill #0

## 2023-02-11 MED ORDER — SULFAMETHOXAZOLE-TRIMETHOPRIM 800-160 MG PO TABS
1.0000 | ORAL_TABLET | Freq: Two times a day (BID) | ORAL | 0 refills | Status: AC
  Filled 2023-02-11: qty 10, 5d supply, fill #0

## 2023-02-11 NOTE — Patient Instructions (Signed)
Thank you, [name] for allowing [place] to provide your care today. Today we discussed:  Abscess: There does not appear to be any thing to cut open and drain on your abscess. We will give you Bactrim an antibiotic to treat this. You can put hot compresses on this to help expel any fluid that may still be left in there. Please come back if this is not getting better in the next 1-2 weeks, if the surrounding redness increases in size, if it becomes painful with more drainage.  Viral respiratory infection: You most likely have a viral respiratory infection. I have prescribed some tessalon pearls for the cough. You can also get cough drops for this. For your throat you can drink hot tea with honey and lemon and also get throat lozenges. If you get a fever, worsening shortness of breath, a productive cough and feeling worse please call back so we could get a cxr and consider antibiotics. I am also giving you an albuterol inhaler to use as needed.      I have ordered the following medication/changed the following medications:   Stop the following medications: There are no discontinued medications.   Start the following medications: Meds ordered this encounter  Medications   sulfamethoxazole-trimethoprim (BACTRIM DS) 800-160 MG tablet    Sig: Take 1 tablet by mouth 2 (two) times daily for 5 days.    Dispense:  10 tablet    Refill:  0   albuterol (VENTOLIN HFA) 108 (90 Base) MCG/ACT inhaler    Sig: Inhale 2 puffs into the lungs every 6 (six) hours as needed for wheezing or shortness of breath.    Dispense:  8 g    Refill:  2   benzonatate (TESSALON PERLES) 100 MG capsule    Sig: Take 2 capsules (200 mg total) by mouth 3 (three) times daily as needed for up to 12 doses for cough.    Dispense:  20 capsule    Refill:  0     Follow up: 6 months     We look forward to seeing you next time. Please call our clinic at [phone] if you have any questions or concerns. The best time to call  is Monday-Friday from 9am-4pm, but there is someone available 24/7. If after hours or the weekend, call the main hospital number and ask for the Internal Medicine Resident On-Call. If you need medication refills, please notify your pharmacy one week in advance and they will send [place] a request.   Thank you for trusting me with your care. Wishing you the best!  Willette Cluster, MD [facility]

## 2023-02-11 NOTE — Progress Notes (Unsigned)
HTN mild LVH Well controlled. On Amlodipine 5 mg qd, Coreg 6.25 mg BID, Chlorthalidone 12.5 mg qd. Normal BMP in 06/2022. Will continue current regimen.  Annual BMP 06/2023  DVTPE Xarelto  HLD LDL 64 11/2019  PFT  Mild mixed obstructive picture with reduced DLCO suggesting emphysema and increased airway resistance as well   Prediabetes  A1c 5.9 06/2022. Annual A1c   HCM DXA PCV shot Shingles  Patient is s/p total hysterectomy for endometriosis including removal of cervix. No further indication for Pap smears.

## 2023-02-12 DIAGNOSIS — L0291 Cutaneous abscess, unspecified: Secondary | ICD-10-CM

## 2023-02-12 DIAGNOSIS — J069 Acute upper respiratory infection, unspecified: Secondary | ICD-10-CM | POA: Insufficient documentation

## 2023-02-12 HISTORY — DX: Cutaneous abscess, unspecified: L02.91

## 2023-02-12 NOTE — Assessment & Plan Note (Signed)
Patient reports HA,fatigue, non-productive cough, exertional dyspnea, throat pain and loss of voice since Monday that is worsening. Denies fever, muscle aches, sneezing, congestion/runny nose. Also denies burning to suggest reflux. Afebrile, satting 95% on RA, normal wob, lctab without crackles, bronchial breath sounds, decreased breath sounds, or increased focal breath sounds. Overall given lack of fever, productive cough, abnormalities on lung auscultation, associated laryngitis suspect viral URI. Prescribed tessalon pearls for cough, also suggested the patient could use cough drops, hot tea with honey and lemon. Lozenges for supportive care. Has had a recent set of PFTs with mild obstructive disease so prescribed albuterol inhaler prn as well. Gave her return precautions if she gets a fever with development of productive cough or feeling worse, worsening dyspnea, or just not getting better in a week.

## 2023-02-12 NOTE — Progress Notes (Signed)
Established Patient Office Visit  Subjective   Patient ID: [name], female    DOB: 1956/04/11  Age: 67 y.o. MRN: [id number]  Chief Complaint  Patient presents with   Cough    Requesting cough medication, states she is not coughing up anything for 3 days.    boil on the right shoulder     Ms. Umali is a 67 y/o female with a pmh outlined below. Please see A&P for HPI information.  Cough      Review of Systems  Respiratory:  Positive for cough.   All other systems reviewed and are negative.     Objective:     BP 128/62 (BP Location: Right Arm, Patient Position: Sitting, Cuff Size: Large)   Pulse 86   Temp 98.5 F (36.9 C) (Oral)   Ht 5\' 3"  (1.6 m)   Wt 215 lb 6.4 oz (97.7 kg)   SpO2 95%   BMI 38.16 kg/m    Physical Exam Constitutional:      General: She is not in acute distress.    Appearance: Normal appearance. She is obese. She is ill-appearing. She is not toxic-appearing.  HENT:     Mouth/Throat:     Mouth: Mucous membranes are dry.     Pharynx: Oropharynx is clear. Posterior oropharyngeal erythema present. No oropharyngeal exudate.  Eyes:     General: No scleral icterus.    Conjunctiva/sclera: Conjunctivae normal.  Cardiovascular:     Rate and Rhythm: Normal rate and regular rhythm.     Pulses: Normal pulses.     Heart sounds: Normal heart sounds. No murmur heard.    No gallop.  Pulmonary:     Effort: Pulmonary effort is normal. No respiratory distress.     Breath sounds: Normal breath sounds. No wheezing or rales.  Musculoskeletal:     Right lower leg: No edema.     Left lower leg: No edema.     Comments: Abscess over the R deltopectoral triangle as pictured in the media tab. Warmth, erythema and surround induration without fluctuance or drainage with squeezing. No ptp.  Skin:    General: Skin is warm and dry.  Neurological:     Mental Status: She is alert.      No results found for any visits on 02/11/23.    The ASCVD Risk  score (Arnett DK, et al., 2019) failed to calculate for the following reasons:   The valid total cholesterol range is 130 to 320 mg/dL    Assessment & Plan:   Problem List Items Addressed This Visit       Respiratory   Viral URI with cough    Patient reports HA,fatigue, non-productive cough, exertional dyspnea, throat pain and loss of voice since Monday that is worsening. Denies fever, muscle aches, sneezing, congestion/runny nose. Also denies burning to suggest reflux. Afebrile, satting 95% on RA, normal wob, lctab without crackles, bronchial breath sounds, decreased breath sounds, or increased focal breath sounds. Overall given lack of fever, productive cough, abnormalities on lung auscultation, associated laryngitis suspect viral URI. Prescribed tessalon pearls for cough, also suggested the patient could use cough drops, hot tea with honey and lemon. Lozenges for supportive care. Has had a recent set of PFTs with mild obstructive disease so prescribed albuterol inhaler prn as well. Gave her return precautions if she gets a fever with development of productive cough or feeling worse, worsening dyspnea, or just not getting better in a week.      Relevant  Medications   sulfamethoxazole-trimethoprim (BACTRIM DS) 800-160 MG tablet   albuterol (VENTOLIN HFA) 108 (90 Base) MCG/ACT inhaler   benzonatate (TESSALON PERLES) 100 MG capsule     Other   Abscess - Primary   Relevant Medications   sulfamethoxazole-trimethoprim (BACTRIM DS) 800-160 MG tablet   Other Visit Diagnoses     Obstructive lung disease (generalized) (HCC)       Relevant Medications   albuterol (VENTOLIN HFA) 108 (90 Base) MCG/ACT inhaler       Return in about 6 months (around 08/13/2023) for chronic medical problems.    Willette Cluster, MD

## 2023-02-12 NOTE — Assessment & Plan Note (Signed)
Patient reports an abscess around the R deltopectoral triangle for 1-2 weeks that recently opened and drained on its own. Denies fever or chills and says it is not painful. See media tab for picture. On exam there is no drainage with squeezing and no fluctuance so do not suspect this is amenable to I&D. There is warmth, erythema and induration surrounding it consistent with cellulitis. Will treat with one tablet of DS bactrim BID x 5 days at home given no systemic signs of infection such as fever, tachycardia or hypotension. No need for CBC at this time. Counseled on use of hot compresses to help with further drainage. Discussed returning if erythema worsens, becomes painful with increased drainage, gets fever and chills without improvement, or if it does not improve in the next 1-2 weeks.

## 2023-02-13 NOTE — Progress Notes (Addendum)
Internal Medicine Clinic Attending  I was physically present during key portions of the resident provided service and participated in the medical decision making of patient's management care. I reviewed pertinent patient test results.  The assessment, diagnosis, and plan were formulated together and I agree with the documentation in the resident's note.  Dickie La, MD

## 2023-02-13 NOTE — Addendum Note (Signed)
Addended by: Dickie La on: 02/13/2023 04:26 PM   Modules accepted: Level of Service

## 2023-02-13 NOTE — Addendum Note (Signed)
Addended by: Dickie La on: 02/13/2023 04:25 PM   Modules accepted: Level of Service

## 2023-02-17 ENCOUNTER — Other Ambulatory Visit: Payer: Self-pay | Admitting: Internal Medicine

## 2023-02-17 ENCOUNTER — Other Ambulatory Visit (HOSPITAL_COMMUNITY): Payer: Self-pay

## 2023-02-17 DIAGNOSIS — I10 Essential (primary) hypertension: Secondary | ICD-10-CM

## 2023-02-17 MED ORDER — ATORVASTATIN CALCIUM 40 MG PO TABS
40.0000 mg | ORAL_TABLET | Freq: Every day | ORAL | 3 refills | Status: DC
  Filled 2023-02-17: qty 90, 90d supply, fill #0
  Filled 2023-07-03: qty 90, 90d supply, fill #1

## 2023-02-18 ENCOUNTER — Other Ambulatory Visit (HOSPITAL_COMMUNITY): Payer: Self-pay

## 2023-02-18 ENCOUNTER — Other Ambulatory Visit: Payer: Self-pay

## 2023-03-03 ENCOUNTER — Other Ambulatory Visit: Payer: Self-pay | Admitting: Internal Medicine

## 2023-03-03 DIAGNOSIS — I10 Essential (primary) hypertension: Secondary | ICD-10-CM

## 2023-03-10 ENCOUNTER — Other Ambulatory Visit (HOSPITAL_COMMUNITY): Payer: Self-pay

## 2023-03-25 ENCOUNTER — Other Ambulatory Visit: Payer: Self-pay | Admitting: Internal Medicine

## 2023-03-25 ENCOUNTER — Other Ambulatory Visit (HOSPITAL_COMMUNITY): Payer: Self-pay

## 2023-03-28 ENCOUNTER — Other Ambulatory Visit (HOSPITAL_COMMUNITY): Payer: Self-pay

## 2023-03-28 MED ORDER — CARVEDILOL 6.25 MG PO TABS
6.2500 mg | ORAL_TABLET | Freq: Two times a day (BID) | ORAL | 3 refills | Status: DC
  Filled 2023-03-28: qty 60, 30d supply, fill #0
  Filled 2023-05-07: qty 60, 30d supply, fill #1
  Filled 2023-06-13: qty 60, 30d supply, fill #2
  Filled 2023-07-15: qty 60, 30d supply, fill #3

## 2023-06-03 ENCOUNTER — Encounter: Payer: Medicare PPO | Admitting: Student

## 2023-06-03 ENCOUNTER — Telehealth: Payer: Self-pay | Admitting: *Deleted

## 2023-06-03 NOTE — Telephone Encounter (Signed)
Call from patient states has a cough is hoarse and heartburn. Sore Throat.  Feels bad.   No fever.  Has not taken a Covid Test.  Given Telehealth appointment for this morning.

## 2023-06-04 ENCOUNTER — Inpatient Hospital Stay (HOSPITAL_BASED_OUTPATIENT_CLINIC_OR_DEPARTMENT_OTHER)
Admission: EM | Admit: 2023-06-04 | Discharge: 2023-06-07 | DRG: 194 | Disposition: A | Discharge status: Home / Self Care | Payer: Medicare PPO | Attending: Internal Medicine | Admitting: Internal Medicine

## 2023-06-04 ENCOUNTER — Other Ambulatory Visit: Payer: Self-pay

## 2023-06-04 ENCOUNTER — Emergency Department (HOSPITAL_BASED_OUTPATIENT_CLINIC_OR_DEPARTMENT_OTHER): Payer: Medicare PPO

## 2023-06-04 ENCOUNTER — Encounter (HOSPITAL_BASED_OUTPATIENT_CLINIC_OR_DEPARTMENT_OTHER): Payer: Self-pay | Admitting: Emergency Medicine

## 2023-06-04 DIAGNOSIS — E669 Obesity, unspecified: Secondary | ICD-10-CM | POA: Diagnosis present

## 2023-06-04 DIAGNOSIS — K219 Gastro-esophageal reflux disease without esophagitis: Secondary | ICD-10-CM | POA: Diagnosis present

## 2023-06-04 DIAGNOSIS — J189 Pneumonia, unspecified organism: Principal | ICD-10-CM | POA: Diagnosis present

## 2023-06-04 DIAGNOSIS — Z881 Allergy status to other antibiotic agents status: Secondary | ICD-10-CM

## 2023-06-04 DIAGNOSIS — Z1152 Encounter for screening for COVID-19: Secondary | ICD-10-CM | POA: Diagnosis not present

## 2023-06-04 DIAGNOSIS — J159 Unspecified bacterial pneumonia: Secondary | ICD-10-CM | POA: Diagnosis present

## 2023-06-04 DIAGNOSIS — Z86711 Personal history of pulmonary embolism: Secondary | ICD-10-CM

## 2023-06-04 DIAGNOSIS — E876 Hypokalemia: Secondary | ICD-10-CM | POA: Diagnosis present

## 2023-06-04 DIAGNOSIS — R0602 Shortness of breath: Secondary | ICD-10-CM | POA: Diagnosis not present

## 2023-06-04 DIAGNOSIS — N179 Acute kidney failure, unspecified: Secondary | ICD-10-CM | POA: Diagnosis present

## 2023-06-04 DIAGNOSIS — Z833 Family history of diabetes mellitus: Secondary | ICD-10-CM | POA: Diagnosis not present

## 2023-06-04 DIAGNOSIS — R590 Localized enlarged lymph nodes: Secondary | ICD-10-CM | POA: Diagnosis not present

## 2023-06-04 DIAGNOSIS — R7303 Prediabetes: Secondary | ICD-10-CM | POA: Diagnosis present

## 2023-06-04 DIAGNOSIS — I959 Hypotension, unspecified: Secondary | ICD-10-CM | POA: Diagnosis present

## 2023-06-04 DIAGNOSIS — I517 Cardiomegaly: Secondary | ICD-10-CM | POA: Diagnosis not present

## 2023-06-04 DIAGNOSIS — Z8249 Family history of ischemic heart disease and other diseases of the circulatory system: Secondary | ICD-10-CM | POA: Diagnosis not present

## 2023-06-04 DIAGNOSIS — Z86718 Personal history of other venous thrombosis and embolism: Secondary | ICD-10-CM

## 2023-06-04 DIAGNOSIS — R0789 Other chest pain: Secondary | ICD-10-CM | POA: Diagnosis not present

## 2023-06-04 DIAGNOSIS — I110 Hypertensive heart disease with heart failure: Secondary | ICD-10-CM | POA: Diagnosis present

## 2023-06-04 DIAGNOSIS — G4733 Obstructive sleep apnea (adult) (pediatric): Secondary | ICD-10-CM | POA: Diagnosis present

## 2023-06-04 DIAGNOSIS — E785 Hyperlipidemia, unspecified: Secondary | ICD-10-CM | POA: Diagnosis present

## 2023-06-04 DIAGNOSIS — R7401 Elevation of levels of liver transaminase levels: Secondary | ICD-10-CM | POA: Diagnosis present

## 2023-06-04 DIAGNOSIS — R0902 Hypoxemia: Secondary | ICD-10-CM

## 2023-06-04 DIAGNOSIS — J168 Pneumonia due to other specified infectious organisms: Secondary | ICD-10-CM | POA: Diagnosis not present

## 2023-06-04 DIAGNOSIS — Z7901 Long term (current) use of anticoagulants: Secondary | ICD-10-CM | POA: Diagnosis not present

## 2023-06-04 DIAGNOSIS — I5032 Chronic diastolic (congestive) heart failure: Secondary | ICD-10-CM | POA: Diagnosis present

## 2023-06-04 DIAGNOSIS — Z6838 Body mass index (BMI) 38.0-38.9, adult: Secondary | ICD-10-CM

## 2023-06-04 DIAGNOSIS — Z79899 Other long term (current) drug therapy: Secondary | ICD-10-CM

## 2023-06-04 DIAGNOSIS — R918 Other nonspecific abnormal finding of lung field: Secondary | ICD-10-CM | POA: Diagnosis not present

## 2023-06-04 DIAGNOSIS — Z885 Allergy status to narcotic agent status: Secondary | ICD-10-CM

## 2023-06-04 LAB — BASIC METABOLIC PANEL
Anion gap: 15 (ref 5–15)
BUN: 21 mg/dL (ref 8–23)
CO2: 27 mmol/L (ref 22–32)
Calcium: 9.6 mg/dL (ref 8.9–10.3)
Chloride: 97 mmol/L — ABNORMAL LOW (ref 98–111)
Creatinine, Ser: 1.24 mg/dL — ABNORMAL HIGH (ref 0.44–1.00)
GFR, Estimated: 48 mL/min — ABNORMAL LOW (ref >60)
Glucose, Bld: 136 mg/dL — ABNORMAL HIGH (ref 70–99)
Potassium: 3.1 mmol/L — ABNORMAL LOW (ref 3.5–5.1)
Sodium: 139 mmol/L (ref 135–145)

## 2023-06-04 LAB — RESP PANEL BY RT-PCR (RSV, FLU A&B, COVID)  RVPGX2
Influenza A by PCR: NEGATIVE
Influenza B by PCR: NEGATIVE
Resp Syncytial Virus by PCR: NEGATIVE
SARS Coronavirus 2 by RT PCR: NEGATIVE

## 2023-06-04 LAB — CBC
HCT: 41.4 % (ref 36.0–46.0)
Hemoglobin: 13.7 g/dL (ref 12.0–15.0)
MCH: 26.9 pg (ref 26.0–34.0)
MCHC: 33.1 g/dL (ref 30.0–36.0)
MCV: 81.3 fL (ref 80.0–100.0)
Platelets: 244 10*3/uL (ref 150–400)
RBC: 5.09 MIL/uL (ref 3.87–5.11)
RDW: 13.6 % (ref 11.5–15.5)
WBC: 17.5 10*3/uL — ABNORMAL HIGH (ref 4.0–10.5)
nRBC: 0 % (ref 0.0–0.2)

## 2023-06-04 LAB — BRAIN NATRIURETIC PEPTIDE: B Natriuretic Peptide: 143.9 pg/mL — ABNORMAL HIGH (ref 0.0–100.0)

## 2023-06-04 LAB — TROPONIN I (HIGH SENSITIVITY)
Troponin I (High Sensitivity): 13 ng/L (ref <18)
Troponin I (High Sensitivity): 14 ng/L (ref <18)

## 2023-06-04 LAB — D-DIMER, QUANTITATIVE: D-Dimer, Quant: 1.73 ug{FEU}/mL — ABNORMAL HIGH (ref 0.00–0.50)

## 2023-06-04 MED ORDER — ALBUTEROL SULFATE (2.5 MG/3ML) 0.083% IN NEBU
2.5000 mg | INHALATION_SOLUTION | Freq: Once | RESPIRATORY_TRACT | Status: AC
  Administered 2023-06-04: 2.5 mg via RESPIRATORY_TRACT
  Filled 2023-06-04: qty 3

## 2023-06-04 MED ORDER — ALBUTEROL SULFATE HFA 108 (90 BASE) MCG/ACT IN AERS: 2.0000 | INHALATION_SPRAY | RESPIRATORY_TRACT | Status: DC | PRN

## 2023-06-04 MED ORDER — SODIUM CHLORIDE 0.9 % IV SOLN
500.0000 mg | Freq: Once | INTRAVENOUS | Status: AC
  Administered 2023-06-04: 500 mg via INTRAVENOUS
  Filled 2023-06-04: qty 5

## 2023-06-04 MED ORDER — SODIUM CHLORIDE 0.9 % IV BOLUS
500.0000 mL | Freq: Once | INTRAVENOUS | Status: AC
  Administered 2023-06-04: 500 mL via INTRAVENOUS

## 2023-06-04 MED ORDER — IOHEXOL 300 MG/ML  SOLN: 100.0000 mL | Freq: Once | INTRAMUSCULAR | Status: DC | PRN

## 2023-06-04 MED ORDER — IOHEXOL 350 MG/ML SOLN
100.0000 mL | Freq: Once | INTRAVENOUS | Status: AC | PRN
  Administered 2023-06-04: 100 mL via INTRAVENOUS

## 2023-06-04 MED ORDER — SODIUM CHLORIDE 0.9 % IV SOLN
1.0000 g | Freq: Once | INTRAVENOUS | Status: AC
  Administered 2023-06-04: 1 g via INTRAVENOUS
  Filled 2023-06-04: qty 10

## 2023-06-04 NOTE — ED Provider Notes (Signed)
[place]MENT AT [facility] Provider Note   CSN: [id number] Arrival date & time: 06/04/23  1511     History  No chief complaint on file.   [name] is a 67 y.o. female.  With a past medical history of HFpEF, PE on Xarelto, hypertension hyperlipidemia presents to the ED for chest pain.  She reports 3 days of ongoing chest discomfort and a dry cough at home.  Cough is made worse by laying flat to sleep.  No nausea, vomiting, abdominal pain, fevers, chills or increased peripheral edema.  Patient notes that she was recently caring for her grandchild who was sick with URI symptoms.  She reports compliance with Xarelto and has not missed any doses recently  HPI     Home Medications Prior to Admission medications   Medication Sig Start Date End Date Taking? Authorizing Provider  albuterol (VENTOLIN HFA) 108 (90 Base) MCG/ACT inhaler Inhale 2 puffs into the lungs every 6 (six) hours as needed for wheezing or shortness of breath. 02/11/23   Willette Cluster, MD  amLODipine (NORVASC) 5 MG tablet Take 1 tablet (5 mg total) by mouth daily. 12/11/22   Parke Poisson, MD  atorvastatin (LIPITOR) 40 MG tablet Take 1 tablet (40 mg total) by mouth daily. 02/17/23   Masters, Katie, DO  benzonatate (TESSALON PERLES) 100 MG capsule Take 2 capsules (200 mg total) by mouth 3 (three) times daily as needed for up to 12 doses for cough. 02/11/23   Willette Cluster, MD  carvedilol (COREG) 6.25 MG tablet Take 1 tablet (6.25 mg total) by mouth 2 (two) times daily with a meal. 03/28/23   Masters, Katie, DO  chlorthalidone (HYGROTON) 25 MG tablet Take 0.5 tablets (12.5 mg total) by mouth daily. 07/02/22   Masters, Katie, DO  fluticasone (FLONASE) 50 MCG/ACT nasal spray Place 1 spray into both nostrils daily. Patient taking differently: Place 1 spray into both nostrils daily as needed for allergies. 10/24/21   Evlyn Kanner, MD  furosemide (LASIX) 20 MG tablet Take 1 tablet (20 mg total)  by mouth daily as needed. Patient taking differently: Take 20 mg by mouth daily as needed for fluid or edema. 10/24/21   Evlyn Kanner, MD  loratadine (CLARITIN) 10 MG tablet Take 10 mg by mouth daily.    [provider]  omeprazole (PRILOSEC) 20 MG capsule Take 2 capsules (40 mg total) by mouth daily. 07/03/22   Masters, Florentina Addison, DO  rivaroxaban (XARELTO) 20 MG TABS tablet Take 1 tablet (20 mg total) by mouth daily with supper. 10/22/22 10/22/23  Gwenevere Abbot, MD      Allergies    Tramadol and Ciprofloxacin    Review of Systems   Review of Systems  Physical Exam Updated Vital Signs BP (!) 117/54   Pulse 83   Temp 99.9 F (37.7 C) (Oral)   Resp 20   SpO2 98%  Physical Exam Vitals and nursing note reviewed.  HENT:     Head: Normocephalic and atraumatic.  Eyes:     Pupils: Pupils are equal, round, and reactive to light.  Cardiovascular:     Rate and Rhythm: Normal rate and regular rhythm.  Pulmonary:     Effort: Pulmonary effort is normal.     Comments: Crackles at right base Dry cough on exam Abdominal:     Palpations: Abdomen is soft.     Tenderness: There is no abdominal tenderness.  Skin:    General: Skin is warm and dry.  Neurological:  Mental Status: She is alert.  Psychiatric:        Mood and Affect: Mood normal.     ED Results / Procedures / Treatments   Labs (all labs ordered are listed, but only abnormal results are displayed) Labs Reviewed  BASIC METABOLIC PANEL - Abnormal; Notable for the following components:      Result Value   Potassium 3.1 (*)    Chloride 97 (*)    Glucose, Bld 136 (*)    Creatinine, Ser 1.24 (*)    GFR, Estimated 48 (*)    All other components within normal limits  CBC - Abnormal; Notable for the following components:   WBC 17.5 (*)    All other components within normal limits  D-DIMER, QUANTITATIVE - Abnormal; Notable for the following components:   D-Dimer, Quant 1.73 (*)    All other components within normal  limits  BRAIN NATRIURETIC PEPTIDE - Abnormal; Notable for the following components:   B Natriuretic Peptide 143.9 (*)    All other components within normal limits  RESP PANEL BY RT-PCR (RSV, FLU A&B, COVID)  RVPGX2  TROPONIN I (HIGH SENSITIVITY)  TROPONIN I (HIGH SENSITIVITY)    EKG EKG Interpretation Date/Time:  Wednesday June 04 2023 15:20:54 EDT Ventricular Rate:  90 PR Interval:  136 QRS Duration:  78 QT Interval:  392 QTC Calculation: 479 R Axis:   75  Text Interpretation: Normal sinus rhythm Septal infarct , age undetermined Abnormal ECG When compared with ECG of 23-Jun-2022 01:38, PREVIOUS ECG IS PRESENT Confirmed by Estelle June [phone]) on 06/04/2023 5:16:14 PM  Radiology CT Angio Chest PE W and/or Wo Contrast  Result Date: 06/04/2023 CLINICAL DATA:  Cough congestion EXAM: CT ANGIOGRAPHY CHEST WITH CONTRAST TECHNIQUE: Multidetector CT imaging of the chest was performed using the standard protocol during bolus administration of intravenous contrast. Multiplanar CT image reconstructions and MIPs were obtained to evaluate the vascular anatomy. RADIATION DOSE REDUCTION: This exam was performed according to the departmental dose-optimization program which includes automated exposure control, adjustment of the mA and/or kV according to patient size and/or use of iterative reconstruction technique. CONTRAST:  OMNIPAQUE IOHEXOL 350 MG/ML SOLN COMPARISON:  Chest x-ray 06/04/2023, chest CT 06/24/2022, 126 2016 FINDINGS: Cardiovascular: Satisfactory opacification of the pulmonary arteries to the segmental level. No evidence of pulmonary embolism. Normal heart size. No pericardial effusion. Nonaneurysmal aorta. No dissection is seen. Mediastinum/Nodes: Midline trachea. No thyroid mass. Mildly enlarged mediastinal lymph nodes. Right paratracheal node measures 11 mm. Precarinal lymph node measures 9 mm. Esophagus within normal limits. Lungs/Pleura: Heterogeneous consolidation in the  right middle and upper lobes consistent with pneumonia. No pleural effusion or pneumothorax Upper Abdomen: No acute abnormality. Musculoskeletal: No chest wall abnormality. No acute or significant osseous findings. Review of the MIP images confirms the above findings. IMPRESSION: 1. Negative for acute pulmonary embolus. 2. Heterogeneous consolidation in the right middle and upper lobes consistent with pneumonia. Imaging follow-up to resolution is recommended 3. Mildly enlarged mediastinal lymph nodes, likely reactive. Electronically Signed   By: Jasmine Pang M.D.   On: 06/04/2023 21:05   DG Chest Port 1 View  Result Date: 06/04/2023 CLINICAL DATA:  Shortness of breath EXAM: PORTABLE CHEST 1 VIEW COMPARISON:  Same day CTA chest, chest radiograph 06/22/2022 FINDINGS: The heart is enlarged.  The upper mediastinal contours are normal. There is consolidative opacity in the right perihilar region, better assessed on subsequently obtained CTA chest. There is no definite pulmonary edema. There is no appreciable pleural effusion.  There is no pneumothorax There is no acute osseous abnormality. IMPRESSION: Consolidative opacity in the right perihilar region, better assessed on subsequently obtained CTA chest. Electronically Signed   By: Lesia Hausen M.D.   On: 06/04/2023 20:11    Procedures .Critical Care  Performed by: Royanne Foots, DO Authorized by: Royanne Foots, DO   Critical care provider statement:    Critical care time (minutes):  37   Critical care time was exclusive of:  Separately billable procedures and treating other patients   Critical care was necessary to treat or prevent imminent or life-threatening deterioration of the following conditions:  Respiratory failure   Critical care was time spent personally by me on the following activities:  Development of treatment plan with patient or surrogate, discussions with consultants, evaluation of patient's response to treatment, examination of  patient, ordering and review of laboratory studies, ordering and review of radiographic studies, ordering and performing treatments and interventions, pulse oximetry, re-evaluation of patient's condition and review of old charts   I assumed direction of critical care for this patient from another provider in my specialty: no     Care discussed with: admitting provider       Medications Ordered in ED Medications  albuterol (VENTOLIN HFA) 108 (90 Base) MCG/ACT inhaler 2 puff (has no administration in time range)  iohexol (OMNIPAQUE) 300 MG/ML solution 100 mL (has no administration in time range)  cefTRIAXone (ROCEPHIN) 1 g in sodium chloride 0.9 % 100 mL IVPB (1 g Intravenous New Bag/Given 06/04/23 2148)  azithromycin (ZITHROMAX) 500 mg in sodium chloride 0.9 % 250 mL IVPB (has no administration in time range)  albuterol (PROVENTIL) (2.5 MG/3ML) 0.083% nebulizer solution 2.5 mg (2.5 mg Nebulization Given 06/04/23 1550)  sodium chloride 0.9 % bolus 500 mL (0 mLs Intravenous Stopped 06/04/23 1904)  iohexol (OMNIPAQUE) 350 MG/ML injection 100 mL (100 mLs Intravenous Contrast Given 06/04/23 1749)    ED Course/ Medical Decision Making/ A&P Clinical Course as of 06/04/23 2214  Wed Jun 04, 2023  1714 Chest x-ray without overt pulmonary edema but question of right-sided consolidation.  Elevated white count of 17.5.  Respiratory viral panel negative.  D-dimer of 1.73.  Will need CT a of the chest to evaluate for pulmonary embolism.  High-sensitivity troponin of 13.  Will obtain delta troponin here.  No ischemic changes on EKG. [MP]  2136 CTA shows no evidence of pulmonary embolism but does show a clear picture of right-sided pneumonia.  Patient would fall into moderate risk group based on curb 65 score  CURB-65 Score for Pneumonia Severity from [email or web address]  on 06/04/2023 ** All calculations should be rechecked by clinician prior to use **  RESULT SUMMARY: 2 points Moderate risk group: 6.8% 30-day  mortality.  Consider inpatient treatment or outpatient with close followup.   INPUTS: Confusion -> 0 = No BUN >19 mg/dL (>7 mmol/L urea) -> 1 = Yes Respiratory Rate >=30 -> 0 = No Systolic BP <90 mmHg or Diastolic BP <=60 mmHg -> 0 = No Age >=65 -> 1 = Yes  Will start on antibiotics and admit patient to medicine service [MP]    Clinical Course User Index [MP] Royanne Foots, DO                                 Medical Decision Making 67 year old female with history as above presenting for cough and chest pain for the last  2 days.  Afebrile and slightly tachypneic on exam.  Hypoxia to mid 90s on room air.  She is not on home O2.  Exam notable for crackles at right base.  Differential diagnosis includes ACS, pulmonary embolism, heart failure exacerbation, pneumonia, and viral upper respiratory infection  Will evaluate for these above diagnoses with EKG, laboratory workup including D-dimer and troponin, chest x-ray and viral swab.  Amount and/or Complexity of Data Reviewed Labs: ordered. Radiology: ordered.  Risk Prescription drug management. Decision regarding hospitalization.           Final Clinical Impression(s) / ED Diagnoses Final diagnoses:  Pneumonia of right lung due to infectious organism, unspecified part of lung  Hypoxemia    Rx / DC Orders ED Discharge Orders     None         Royanne Foots, DO 06/04/23 2214

## 2023-06-04 NOTE — ED Notes (Signed)
Pt given water per EDP approval

## 2023-06-04 NOTE — ED Notes (Signed)
Pt ambulatory to restroom; [facility] with minimal exertion. Pt placed back in bed on monitor, in NAD at this time

## 2023-06-04 NOTE — ED Triage Notes (Signed)
Pt presents with cough/congestion since Sunday,no fevers. Today her chest is hurting and short of breath.

## 2023-06-04 NOTE — Progress Notes (Signed)
67 year old with history of preserved ejection fraction CHF, bronchitis, PE on Xarelto, sleep apnea, hypertension, hyperlipidemia, and more presents the ED with a chief complaint of dyspnea.  Patient has dyspnea and chest pain.  And going on for 3 days.  She also has a dry cough.  No fever.  Workup revealed a hypokalemia, slight bump in creatinine.  Troponin normal x 2.  D-dimer elevated so CTA was done.  CTA showed right middle lobe and right upper lobe pneumonia but no PE.  Patient started on Rocephin and Zithromax.  Curb 65 is moderate risk.  Admission requested for community-acquired pneumonia.  TRH to assume care when patient arrives in hospital

## 2023-06-04 NOTE — ED Notes (Signed)
Pt placed on 2L [place] per EDP order

## 2023-06-04 NOTE — ED Notes (Signed)
RT Note: Patient placed on 2L per Dr. Elayne Snare

## 2023-06-05 DIAGNOSIS — G4733 Obstructive sleep apnea (adult) (pediatric): Secondary | ICD-10-CM | POA: Diagnosis present

## 2023-06-05 DIAGNOSIS — J189 Pneumonia, unspecified organism: Secondary | ICD-10-CM

## 2023-06-05 DIAGNOSIS — Z6838 Body mass index (BMI) 38.0-38.9, adult: Secondary | ICD-10-CM | POA: Diagnosis not present

## 2023-06-05 DIAGNOSIS — J159 Unspecified bacterial pneumonia: Secondary | ICD-10-CM | POA: Diagnosis present

## 2023-06-05 DIAGNOSIS — R7401 Elevation of levels of liver transaminase levels: Secondary | ICD-10-CM | POA: Diagnosis present

## 2023-06-05 DIAGNOSIS — Z79899 Other long term (current) drug therapy: Secondary | ICD-10-CM | POA: Diagnosis not present

## 2023-06-05 DIAGNOSIS — Z86718 Personal history of other venous thrombosis and embolism: Secondary | ICD-10-CM | POA: Diagnosis not present

## 2023-06-05 DIAGNOSIS — Z86711 Personal history of pulmonary embolism: Secondary | ICD-10-CM | POA: Diagnosis not present

## 2023-06-05 DIAGNOSIS — E669 Obesity, unspecified: Secondary | ICD-10-CM | POA: Diagnosis present

## 2023-06-05 DIAGNOSIS — Z885 Allergy status to narcotic agent status: Secondary | ICD-10-CM | POA: Diagnosis not present

## 2023-06-05 DIAGNOSIS — E876 Hypokalemia: Secondary | ICD-10-CM | POA: Diagnosis present

## 2023-06-05 DIAGNOSIS — I959 Hypotension, unspecified: Secondary | ICD-10-CM | POA: Diagnosis present

## 2023-06-05 DIAGNOSIS — Z8249 Family history of ischemic heart disease and other diseases of the circulatory system: Secondary | ICD-10-CM | POA: Diagnosis not present

## 2023-06-05 DIAGNOSIS — N179 Acute kidney failure, unspecified: Secondary | ICD-10-CM | POA: Diagnosis present

## 2023-06-05 DIAGNOSIS — Z7901 Long term (current) use of anticoagulants: Secondary | ICD-10-CM | POA: Diagnosis not present

## 2023-06-05 DIAGNOSIS — Z833 Family history of diabetes mellitus: Secondary | ICD-10-CM | POA: Diagnosis not present

## 2023-06-05 DIAGNOSIS — R590 Localized enlarged lymph nodes: Secondary | ICD-10-CM | POA: Diagnosis not present

## 2023-06-05 DIAGNOSIS — R7303 Prediabetes: Secondary | ICD-10-CM | POA: Diagnosis present

## 2023-06-05 DIAGNOSIS — Z1152 Encounter for screening for COVID-19: Secondary | ICD-10-CM | POA: Diagnosis not present

## 2023-06-05 DIAGNOSIS — R0902 Hypoxemia: Secondary | ICD-10-CM | POA: Diagnosis present

## 2023-06-05 DIAGNOSIS — I110 Hypertensive heart disease with heart failure: Secondary | ICD-10-CM | POA: Diagnosis present

## 2023-06-05 DIAGNOSIS — K219 Gastro-esophageal reflux disease without esophagitis: Secondary | ICD-10-CM | POA: Diagnosis present

## 2023-06-05 DIAGNOSIS — I5032 Chronic diastolic (congestive) heart failure: Secondary | ICD-10-CM | POA: Diagnosis present

## 2023-06-05 DIAGNOSIS — Z881 Allergy status to other antibiotic agents status: Secondary | ICD-10-CM | POA: Diagnosis not present

## 2023-06-05 DIAGNOSIS — E785 Hyperlipidemia, unspecified: Secondary | ICD-10-CM | POA: Diagnosis present

## 2023-06-05 LAB — COMPREHENSIVE METABOLIC PANEL
ALT: 25 U/L (ref 0–44)
AST: 44 U/L — ABNORMAL HIGH (ref 15–41)
Albumin: 2.6 g/dL — ABNORMAL LOW (ref 3.5–5.0)
Alkaline Phosphatase: 90 U/L (ref 38–126)
Anion gap: 13 (ref 5–15)
BUN: 12 mg/dL (ref 8–23)
CO2: 27 mmol/L (ref 22–32)
Calcium: 8.6 mg/dL — ABNORMAL LOW (ref 8.9–10.3)
Chloride: 98 mmol/L (ref 98–111)
Creatinine, Ser: 1.14 mg/dL — ABNORMAL HIGH (ref 0.44–1.00)
GFR, Estimated: 53 mL/min — ABNORMAL LOW (ref >60)
Glucose, Bld: 93 mg/dL (ref 70–99)
Potassium: 2.9 mmol/L — ABNORMAL LOW (ref 3.5–5.1)
Sodium: 138 mmol/L (ref 135–145)
Total Bilirubin: 0.9 mg/dL (ref 0.3–1.2)
Total Protein: 6.5 g/dL (ref 6.5–8.1)

## 2023-06-05 LAB — RESPIRATORY PANEL BY PCR

## 2023-06-05 LAB — CBC WITH DIFFERENTIAL/PLATELET
Abs Immature Granulocytes: 0.1 10*3/uL — ABNORMAL HIGH (ref 0.00–0.07)
Basophils Absolute: 0 10*3/uL (ref 0.0–0.1)
Basophils Relative: 0 %
Eosinophils Absolute: 0.1 10*3/uL (ref 0.0–0.5)
Eosinophils Relative: 0 %
HCT: 35.5 % — ABNORMAL LOW (ref 36.0–46.0)
Hemoglobin: 11.7 g/dL — ABNORMAL LOW (ref 12.0–15.0)
Immature Granulocytes: 1 %
Lymphocytes Relative: 13 %
Lymphs Abs: 2 10*3/uL (ref 0.7–4.0)
MCH: 26.5 pg (ref 26.0–34.0)
MCHC: 33 g/dL (ref 30.0–36.0)
MCV: 80.3 fL (ref 80.0–100.0)
Monocytes Absolute: 1.9 10*3/uL — ABNORMAL HIGH (ref 0.1–1.0)
Monocytes Relative: 12 %
Neutro Abs: 12 10*3/uL — ABNORMAL HIGH (ref 1.7–7.7)
Neutrophils Relative %: 74 %
Platelets: 219 10*3/uL (ref 150–400)
RBC: 4.42 MIL/uL (ref 3.87–5.11)
RDW: 14 % (ref 11.5–15.5)
WBC: 16.2 10*3/uL — ABNORMAL HIGH (ref 4.0–10.5)
nRBC: 0 % (ref 0.0–0.2)

## 2023-06-05 LAB — STREP PNEUMONIAE URINARY ANTIGEN: Strep Pneumo Urinary Antigen: NEGATIVE

## 2023-06-05 LAB — MAGNESIUM: Magnesium: 2.2 mg/dL (ref 1.7–2.4)

## 2023-06-05 MED ORDER — SODIUM CHLORIDE 0.9 % IV SOLN
500.0000 mg | INTRAVENOUS | Status: DC
  Administered 2023-06-05: 500 mg via INTRAVENOUS
  Filled 2023-06-05: qty 5

## 2023-06-05 MED ORDER — AMLODIPINE BESYLATE 5 MG PO TABS: 5.0000 mg | ORAL_TABLET | Freq: Every day | ORAL | Status: DC

## 2023-06-05 MED ORDER — CARVEDILOL 6.25 MG PO TABS: 6.2500 mg | ORAL_TABLET | Freq: Two times a day (BID) | ORAL | Status: DC

## 2023-06-05 MED ORDER — MELATONIN 3 MG PO TABS: 3.0000 mg | ORAL_TABLET | Freq: Every evening | ORAL | Status: DC | PRN

## 2023-06-05 MED ORDER — SODIUM CHLORIDE 0.9% FLUSH
3.0000 mL | INTRAVENOUS | Status: DC | PRN
  Administered 2023-06-05 (×2): 3 mL via INTRAVENOUS

## 2023-06-05 MED ORDER — POTASSIUM CHLORIDE CRYS ER 20 MEQ PO TBCR
40.0000 meq | EXTENDED_RELEASE_TABLET | Freq: Two times a day (BID) | ORAL | Status: DC
  Administered 2023-06-05 – 2023-06-06 (×4): 40 meq via ORAL
  Filled 2023-06-05 (×4): qty 2

## 2023-06-05 MED ORDER — SODIUM CHLORIDE 0.9% FLUSH
3.0000 mL | Freq: Two times a day (BID) | INTRAVENOUS | Status: DC
  Administered 2023-06-05 – 2023-06-07 (×5): 3 mL via INTRAVENOUS

## 2023-06-05 MED ORDER — CHLORTHALIDONE 25 MG PO TABS: 12.5000 mg | ORAL_TABLET | Freq: Every day | ORAL | Status: DC

## 2023-06-05 MED ORDER — ONDANSETRON HCL 4 MG/2ML IJ SOLN: 4.0000 mg | Freq: Four times a day (QID) | INTRAMUSCULAR | Status: DC | PRN

## 2023-06-05 MED ORDER — ACETAMINOPHEN 325 MG PO TABS: 650.0000 mg | ORAL_TABLET | Freq: Four times a day (QID) | ORAL | Status: DC | PRN

## 2023-06-05 MED ORDER — SODIUM CHLORIDE 0.9% FLUSH
10.0000 mL | Freq: Two times a day (BID) | INTRAVENOUS | Status: DC
  Administered 2023-06-05 – 2023-06-07 (×5): 10 mL via INTRAVENOUS

## 2023-06-05 MED ORDER — ONDANSETRON HCL 4 MG PO TABS: 4.0000 mg | ORAL_TABLET | Freq: Four times a day (QID) | ORAL | Status: DC | PRN

## 2023-06-05 MED ORDER — ACETAMINOPHEN 650 MG RE SUPP: 650.0000 mg | Freq: Four times a day (QID) | RECTAL | Status: DC | PRN

## 2023-06-05 MED ORDER — BENZONATATE 100 MG PO CAPS: 200.0000 mg | ORAL_CAPSULE | Freq: Three times a day (TID) | ORAL | Status: DC | PRN

## 2023-06-05 MED ORDER — ATORVASTATIN CALCIUM 40 MG PO TABS
40.0000 mg | ORAL_TABLET | Freq: Every day | ORAL | Status: DC
  Administered 2023-06-05 – 2023-06-07 (×3): 40 mg via ORAL
  Filled 2023-06-05 (×2): qty 1

## 2023-06-05 MED ORDER — ALBUTEROL SULFATE (2.5 MG/3ML) 0.083% IN NEBU: 2.5000 mg | INHALATION_SOLUTION | RESPIRATORY_TRACT | Status: DC | PRN

## 2023-06-05 MED ORDER — RIVAROXABAN 20 MG PO TABS
20.0000 mg | ORAL_TABLET | Freq: Every day | ORAL | Status: DC
  Administered 2023-06-05 – 2023-06-06 (×2): 20 mg via ORAL
  Filled 2023-06-05 (×2): qty 1

## 2023-06-05 MED ORDER — HYDRALAZINE HCL 20 MG/ML IJ SOLN: 5.0000 mg | INTRAMUSCULAR | Status: DC | PRN

## 2023-06-05 MED ORDER — SODIUM CHLORIDE 0.9 % IV SOLN: 1.0000 g | INTRAVENOUS | Status: DC

## 2023-06-05 MED ORDER — GUAIFENESIN ER 600 MG PO TB12
600.0000 mg | ORAL_TABLET | Freq: Two times a day (BID) | ORAL | Status: DC
  Administered 2023-06-05 – 2023-06-07 (×5): 600 mg via ORAL
  Filled 2023-06-05 (×5): qty 1

## 2023-06-05 MED ORDER — PANTOPRAZOLE SODIUM 40 MG PO TBEC
40.0000 mg | DELAYED_RELEASE_TABLET | Freq: Every day | ORAL | Status: DC
  Administered 2023-06-05 – 2023-06-07 (×3): 40 mg via ORAL
  Filled 2023-06-05 (×3): qty 1

## 2023-06-05 MED ORDER — SODIUM CHLORIDE 0.9 % IV SOLN
2.0000 g | INTRAVENOUS | Status: DC
  Administered 2023-06-05: 2 g via INTRAVENOUS
  Filled 2023-06-05: qty 20

## 2023-06-05 NOTE — Progress Notes (Signed)
   06/05/23 1011  TOC Brief Assessment  Insurance and Status Reviewed  Patient has primary care physician Yes  Home environment has been reviewed yes  Prior level of function: independent  Prior/Current Home Services No current home services  Social Determinants of Health Reivew SDOH reviewed no interventions necessary  Readmission risk has been reviewed Yes  Transition of care needs no transition of care needs at this time

## 2023-06-05 NOTE — H&P (Signed)
History and Physical    Patient: [name] BJY:[id number] DOB: 07/25/1956 DOA: 06/04/2023 DOS: the patient was seen and examined on 06/05/2023 PCP: Rudene Christians, DO  Patient coming from: Home  Chief Complaint: Chest pain  HPI: [name] is a 67 y.o. female with medical history significant of hypertension, hyperlipidemia, HFpEF, history of PE and DVT on Xarelto, GERD, and OSA.  She presented to [facility] ED yesterday with reports of chest pain. She reports 3 days of ongoing chest discomfort and a dry cough that is worse when she is lying flat.  She did have a recent sick contact with great-grandchild who had URI symptoms. She reports compliance with home Xarelto and has not missed any not doses. Patient denies any dysphagia symptoms and has not noted any coughing while eating.  ED Course: On arrival to [facility] ED she was noted to be afebrile temp 30 6.8C, BP 113/67, HR 92, RR 21, SpO2 93% on room air.  Labs notable for potassium 3.1, creatinine 1.24 apparent baseline around 0.9-0.1, leukocytosis WBC 17.5, troponin negative x 2 and flat 13-->14, noted to be RSV flu A&B, COVID-negative.  EKG obtained and without ischemic changes. D-dimer elevated at 1.73 and BNP 143.9.  CTA PE obtained and negative for pulmonary embolism, imaging did show consolidation in the right middle and upper lobes.  Patient was given azithromycin and ceftriaxone as well as a 500 mL normal saline bolus. TRH contacted for admission.  Review of Systems: As mentioned in the history of present illness. All other systems reviewed and are negative. Past Medical History:  Diagnosis Date   (HFpEF) heart failure with preserved ejection fraction (HCC)    Acute kidney injury (HCC)    Bronchitis due to COVID-19 virus 03/23/2019   DVT (deep venous thrombosis) (HCC)    Hyperlipidemia    Hypertension    Leg pain    right   Obesity (BMI 30-39.9)    OSA (obstructive sleep apnea)    PE (pulmonary embolism)     Prediabetes    Sleep apnea    Trigger finger, left ring finger 01/04/2022   Past Surgical History:  Procedure Laterality Date   ABDOMINAL HYSTERECTOMY     APPENDECTOMY     BACK SURGERY     CHOLECYSTECTOMY     Gall Bladder removal   COLONOSCOPY WITH PROPOFOL N/A 05/06/2017   Procedure: COLONOSCOPY WITH PROPOFOL;  Surgeon: Charlott Rakes, MD;  Location: [facility];  Service: Endoscopy;  Laterality: N/A;   LAMINOTOMY     right at L4-5 with a disc bulge and superimposed right lateral recess protrusion encroaching on the L5 root   SPINE SURGERY     decompression surgery   Social History:  reports that she has never smoked. She has never used smokeless tobacco. She reports that she does not drink alcohol. No history on file for drug use.  Allergies  Allergen Reactions   Tramadol Nausea And Vomiting   Ciprofloxacin Rash    Family History  Problem Relation Age of Onset   Hypertension Other    Heart disease Other    Diabetes Other    Coronary artery disease Other     Prior to Admission medications   Medication Sig Start Date End Date Taking? Authorizing Provider  albuterol (VENTOLIN HFA) 108 (90 Base) MCG/ACT inhaler Inhale 2 puffs into the lungs every 6 (six) hours as needed for wheezing or shortness of breath. 02/11/23   Willette Cluster, MD  amLODipine (NORVASC) 5 MG tablet Take 1  tablet (5 mg total) by mouth daily. 12/11/22   Parke Poisson, MD  atorvastatin (LIPITOR) 40 MG tablet Take 1 tablet (40 mg total) by mouth daily. 02/17/23   Masters, Katie, DO  benzonatate (TESSALON PERLES) 100 MG capsule Take 2 capsules (200 mg total) by mouth 3 (three) times daily as needed for up to 12 doses for cough. 02/11/23   Willette Cluster, MD  carvedilol (COREG) 6.25 MG tablet Take 1 tablet (6.25 mg total) by mouth 2 (two) times daily with a meal. 03/28/23   Masters, Katie, DO  chlorthalidone (HYGROTON) 25 MG tablet Take 0.5 tablets (12.5 mg total) by mouth daily. 07/02/22   Masters, Katie, DO   fluticasone (FLONASE) 50 MCG/ACT nasal spray Place 1 spray into both nostrils daily. Patient taking differently: Place 1 spray into both nostrils daily as needed for allergies. 10/24/21   Evlyn Kanner, MD  furosemide (LASIX) 20 MG tablet Take 1 tablet (20 mg total) by mouth daily as needed. Patient taking differently: Take 20 mg by mouth daily as needed for fluid or edema. 10/24/21   Evlyn Kanner, MD  loratadine (CLARITIN) 10 MG tablet Take 10 mg by mouth daily.    [provider]  omeprazole (PRILOSEC) 20 MG capsule Take 2 capsules (40 mg total) by mouth daily. 07/03/22   Masters, Florentina Addison, DO  rivaroxaban (XARELTO) 20 MG TABS tablet Take 1 tablet (20 mg total) by mouth daily with supper. 10/22/22 10/22/23  Gwenevere Abbot, MD    Physical Exam: Vitals:   06/05/23 0030 06/05/23 0045 06/05/23 0137 06/05/23 0521  BP: 105/61 117/69 109/69 (!) 106/58  Pulse: 81 81 81 73  Resp: (!) 21 18 (!) 24 (!) 24  Temp:   100.3 F (37.9 C) 98 F (36.7 C)  TempSrc:   Oral Oral  SpO2: 96% 96% 94% 99%   Constitutional: NAD, calm, comfortable Eyes: PERRL, lids and conjunctivae normal ENMT: Mucous membranes are moist. Posterior pharynx clear of any exudate or lesions. Neck: normal, supple, no masses, no thyromegaly Respiratory: (R) basilar crackles. Normal respiratory effort. No accessory muscle use.  Cardiovascular: Regular rate and rhythm, no murmurs / rubs / gallops. +1 bilateral ankle edema. 2+ radial and pedal pulses.   Abdomen: no tenderness, no masses palpated. No hepatosplenomegaly. Bowel sounds positive.  Musculoskeletal: No joint deformity upper and lower extremities. Good ROM, no contractures. Normal muscle tone.  Skin: no rashes, lesions, ulcers.  Neurologic: Alert and oriented x 3.   Data Reviewed:  CBC    Component Value Date/Time   WBC 16.2 (H) 06/05/2023 0644   RBC 4.42 06/05/2023 0644   HGB 11.7 (L) 06/05/2023 0644   HGB 13.8 04/12/2016 1533   HGB 11.9 02/18/2011 1519    HCT 35.5 (L) 06/05/2023 0644   HCT 42.4 04/12/2016 1533   HCT 36.0 02/18/2011 1519   PLT 219 06/05/2023 0644   PLT 283 04/12/2016 1533   MCV 80.3 06/05/2023 0644   MCV 82 04/12/2016 1533   MCV 83.0 02/18/2011 1519   MCH 26.5 06/05/2023 0644   MCHC 33.0 06/05/2023 0644   RDW 14.0 06/05/2023 0644   RDW 14.1 04/12/2016 1533   RDW 14.5 02/18/2011 1519   LYMPHSABS 2.0 06/05/2023 0644   LYMPHSABS 2.0 02/18/2011 1519   MONOABS 1.9 (H) 06/05/2023 0644   MONOABS 0.8 02/18/2011 1519   EOSABS 0.1 06/05/2023 0644   EOSABS 0.2 02/18/2011 1519   BASOSABS 0.0 06/05/2023 0644   BASOSABS 0.0 02/18/2011 1519      Latest  Ref Rng & Units 06/05/2023    6:44 AM 06/04/2023    3:24 PM 07/05/2022   11:33 AM  BMP  Glucose 70 - 99 mg/dL 93  161  98   BUN 8 - 23 mg/dL 12  21  9    Creatinine 0.44 - 1.00 mg/dL 0.96  0.45  4.09   BUN/Creat Ratio 12 - 28   9   Sodium 135 - 145 mmol/L 138  139  145   Potassium 3.5 - 5.1 mmol/L 2.9  3.1  3.7   Chloride 98 - 111 mmol/L 98  97  104   CO2 22 - 32 mmol/L 27  27  29    Calcium 8.9 - 10.3 mg/dL 8.6  9.6  9.5    Magnesium    Component Value Date/Time   MAGNESIUM 2.2 06/05/2023 0644     Cardiac Panel (last 3 results) Recent Labs    06/04/23 1524 06/04/23 1718  TROPONINIHS 13 14   D-Dimer    Component Value Date/Time   DDIMER 1.73 06/04/2023 1530   BNP    Component Value Date/Time   BNP 143.9 (H) 06/04/2023 1537   CT Angio Chest PE W and/or Wo Contrast  Result Date: 06/04/2023 CLINICAL DATA:  Cough congestion EXAM: CT ANGIOGRAPHY CHEST WITH CONTRAST TECHNIQUE: Multidetector CT imaging of the chest was performed using the standard protocol during bolus administration of intravenous contrast. Multiplanar CT image reconstructions and MIPs were obtained to evaluate the vascular anatomy. RADIATION DOSE REDUCTION: This exam was performed according to the departmental dose-optimization program which includes automated exposure control, adjustment of  the mA and/or kV according to patient size and/or use of iterative reconstruction technique. CONTRAST:  OMNIPAQUE IOHEXOL 350 MG/ML SOLN COMPARISON:  Chest x-ray 06/04/2023, chest CT 06/24/2022, 126 2016 FINDINGS: Cardiovascular: Satisfactory opacification of the pulmonary arteries to the segmental level. No evidence of pulmonary embolism. Normal heart size. No pericardial effusion. Nonaneurysmal aorta. No dissection is seen. Mediastinum/Nodes: Midline trachea. No thyroid mass. Mildly enlarged mediastinal lymph nodes. Right paratracheal node measures 11 mm. Precarinal lymph node measures 9 mm. Esophagus within normal limits. Lungs/Pleura: Heterogeneous consolidation in the right middle and upper lobes consistent with pneumonia. No pleural effusion or pneumothorax Upper Abdomen: No acute abnormality. Musculoskeletal: No chest wall abnormality. No acute or significant osseous findings. Review of the MIP images confirms the above findings. IMPRESSION: 1. Negative for acute pulmonary embolus. 2. Heterogeneous consolidation in the right middle and upper lobes consistent with pneumonia. Imaging follow-up to resolution is recommended 3. Mildly enlarged mediastinal lymph nodes, likely reactive. Electronically Signed   By: Jasmine Pang M.D.   On: 06/04/2023 21:05   DG Chest Port 1 View  Result Date: 06/04/2023 CLINICAL DATA:  Shortness of breath EXAM: PORTABLE CHEST 1 VIEW COMPARISON:  Same day CTA chest, chest radiograph 06/22/2022 FINDINGS: The heart is enlarged.  The upper mediastinal contours are normal. There is consolidative opacity in the right perihilar region, better assessed on subsequently obtained CTA chest. There is no definite pulmonary edema. There is no appreciable pleural effusion. There is no pneumothorax There is no acute osseous abnormality. IMPRESSION: Consolidative opacity in the right perihilar region, better assessed on subsequently obtained CTA chest. Electronically Signed   By: Lesia Hausen M.D.   On: 06/04/2023 20:11      Results for orders placed or performed during the hospital encounter of 06/04/23  Resp panel by RT-PCR (RSV, Flu A&B, Covid) Anterior Nasal Swab     Status: None  Collection Time: 06/04/23  3:24 PM   Specimen: Anterior Nasal Swab  Result Value Ref Range Status   SARS Coronavirus 2 by RT PCR NEGATIVE NEGATIVE Final    Comment: (NOTE) SARS-CoV-2 target nucleic acids are NOT DETECTED.  The SARS-CoV-2 RNA is generally detectable in upper respiratory specimens during the acute phase of infection. The lowest concentration of SARS-CoV-2 viral copies this assay can detect is 138 copies/mL. A negative result does not preclude SARS-Cov-2 infection and should not be used as the sole basis for treatment or other patient management decisions. A negative result may occur with  improper specimen collection/handling, submission of specimen other than nasopharyngeal swab, presence of viral mutation(s) within the areas targeted by this assay, and inadequate number of viral copies(<138 copies/mL). A negative result must be combined with clinical observations, patient history, and epidemiological information. The expected result is Negative.  Fact Sheet for Patients:  BloggerCourse.com  Fact Sheet for Healthcare Providers:  SeriousBroker.it  This test is no t yet approved or cleared by the [place] FDA and  has been authorized for detection and/or diagnosis of SARS-CoV-2 by FDA under an Emergency Use Authorization (EUA). This EUA will remain  in effect (meaning this test can be used) for the duration of the COVID-19 declaration under Section 564(b)(1) of the Act, 21 U.S.C.section 360bbb-3(b)(1), unless the authorization is terminated  or revoked sooner.       Influenza A by PCR NEGATIVE NEGATIVE Final   Influenza B by PCR NEGATIVE NEGATIVE Final    Comment: (NOTE) The Xpert Xpress SARS-CoV-2/FLU/RSV  plus assay is intended as an aid in the diagnosis of influenza from Nasopharyngeal swab specimens and should not be used as a sole basis for treatment. Nasal washings and aspirates are unacceptable for Xpert Xpress SARS-CoV-2/FLU/RSV testing.  Fact Sheet for Patients: BloggerCourse.com  Fact Sheet for Healthcare Providers: SeriousBroker.it  This test is not yet approved or cleared by the [place] FDA and has been authorized for detection and/or diagnosis of SARS-CoV-2 by FDA under an Emergency Use Authorization (EUA). This EUA will remain in effect (meaning this test can be used) for the duration of the COVID-19 declaration under Section 564(b)(1) of the Act, 21 U.S.C. section 360bbb-3(b)(1), unless the authorization is terminated or revoked.     Resp Syncytial Virus by PCR NEGATIVE NEGATIVE Final    Comment: (NOTE) Fact Sheet for Patients: BloggerCourse.com  Fact Sheet for Healthcare Providers: SeriousBroker.it  This test is not yet approved or cleared by the [place] FDA and has been authorized for detection and/or diagnosis of SARS-CoV-2 by FDA under an Emergency Use Authorization (EUA). This EUA will remain in effect (meaning this test can be used) for the duration of the COVID-19 declaration under Section 564(b)(1) of the Act, 21 U.S.C. section 360bbb-3(b)(1), unless the authorization is terminated or revoked.  Performed at [facility], [address] [place], [place] [address]        Assessment and Plan: #Community Acquired Pneumonia  Right middle and upper lobe heterogenous consolidation seen on CTA PE. Started on Ceftriaxone and Azithromycin in ED. Recent sick contact: great grandchild with URI symptoms. - Continue Ceftriaxone and Azithromycin - Respiratory 20 Pathogen Panel   - Urine Strep and Legionella - Sputum culture -  Mucinex - Nebulizers PRN - Pulmonary Toilet - Tessalon Pearls - Currently on 2L Supplemental O2, wean as able  #Renal Insufficiency Slight creatinine increase to 1.24. Recent baseline 0.9-1.0. -Hold home chlorthalidone -Repeat BMP with AM labs  #Hypokalemia -  Replete with 40 mEq of potassium -Repeat BMP with AM labs  #Elevated D-dimer with history of pulmonary embolism and DVT CTA PE obtained in ED negative for PE -Continue home Xarelto  #Hypertension BP soft overnight, most recent 100/58 MAP 69 -Hold home Coreg, Norvasc, and Chlorthalidone  #Hyperlipidemia - Continue home Lipitor  #OSA -Nightly CPAP  #GERD -Protonix  VTE prophylaxis: On Xarelto GI prophylaxis: Protonix Diet: Regular Access: PIV Lines: NONE Code Status: Full Telemetry: Yes Disposition: Admit to MedSurg with telemetry   Advance Care Planning: Code Status: FULL CODE  Consults: N/A  Family Communication: Patient stated she will update her grand-daughter when asked if there was someone she would like me to call and update  Severity of Illness: The appropriate patient status for this patient is INPATIENT. Inpatient status is judged to be reasonable and necessary in order to provide the required intensity of service to ensure the patient's safety. The patient's presenting symptoms, physical exam findings, and initial radiographic and laboratory data in the context of their chronic comorbidities is felt to place them at high risk for further clinical deterioration. Furthermore, it is not anticipated that the patient will be medically stable for discharge from the hospital within 2 midnights of admission.   * I certify that at the point of admission it is my clinical judgment that the patient will require inpatient hospital care spanning beyond 2 midnights from the point of admission due to high intensity of service, high risk for further deterioration and high frequency of surveillance required.*  To  reach the provider On-Call:   7AM- 7PM see care teams to locate the attending and reach out to them via [email or web address] Password: [email or web address] 7PM-7AM contact night-coverage If you still have difficulty reaching the appropriate provider, please page the [facility] (Director on Call) for [facility] on amion for assistance  This document was prepared using Conservation officer, historic buildings and may include unintentional dictation errors.  Bishop Limbo FNP-BC, PMHNP-BC Nurse Practitioner [facility]

## 2023-06-05 NOTE — Progress Notes (Signed)
Patient admitted to room 6N16. Patient is alert and oriented x 4. Patient denies pain at this time. Patient skin assessed and is intact and no abnormal marks noted. Patient oriented to room, unit and staff.

## 2023-06-05 NOTE — Progress Notes (Signed)
(  Carryover admission to the Day Admitter; accepted by Dr.  Carren Rang as transfer from  [facility]  to a  med-tele bed at  [facility]  for  CAP. Please see Dr.  Elita Boone  transfer progress note for additional details).  I have placed some additional preliminary admit orders via the adult multi-morbid admission order set. I have also ordered continuation of the azithromycin and Rocephin that was started at [facility] earlier today.  I have continued existing orders for prn albuterol nebulizer and have ordered morning labs in the form of CMP, CBC, and serum magnesium level.    Newton Pigg, DO Hospitalist

## 2023-06-05 NOTE — Plan of Care (Signed)
  Problem: Health Behavior/Discharge Planning: Goal: Ability to manage health-related needs will improve Outcome: Progressing   Problem: Clinical Measurements: Goal: Respiratory complications will improve Outcome: Progressing Goal: Cardiovascular complication will be avoided Outcome: Progressing   Problem: Nutrition: Goal: Adequate nutrition will be maintained Outcome: Progressing   Problem: Coping: Goal: Level of anxiety will decrease Outcome: Progressing

## 2023-06-05 NOTE — Plan of Care (Signed)
  Problem: Health Behavior/Discharge Planning: Goal: Ability to manage health-related needs will improve Outcome: Progressing   Problem: Pain Managment: Goal: General experience of comfort will improve Outcome: Progressing   Problem: Safety: Goal: Ability to remain free from injury will improve Outcome: Progressing   Problem: Respiratory: Goal: Ability to maintain a clear airway will improve Outcome: Progressing

## 2023-06-06 DIAGNOSIS — R590 Localized enlarged lymph nodes: Secondary | ICD-10-CM | POA: Diagnosis not present

## 2023-06-06 DIAGNOSIS — J189 Pneumonia, unspecified organism: Secondary | ICD-10-CM | POA: Diagnosis not present

## 2023-06-06 LAB — CBC
HCT: 36 % (ref 36.0–46.0)
Hemoglobin: 11.7 g/dL — ABNORMAL LOW (ref 12.0–15.0)
MCH: 26.2 pg (ref 26.0–34.0)
MCHC: 32.5 g/dL (ref 30.0–36.0)
MCV: 80.5 fL (ref 80.0–100.0)
Platelets: 246 10*3/uL (ref 150–400)
RBC: 4.47 MIL/uL (ref 3.87–5.11)
RDW: 13.9 % (ref 11.5–15.5)
WBC: 9.6 10*3/uL (ref 4.0–10.5)
nRBC: 0 % (ref 0.0–0.2)

## 2023-06-06 LAB — COMPREHENSIVE METABOLIC PANEL
ALT: 37 U/L (ref 0–44)
AST: 54 U/L — ABNORMAL HIGH (ref 15–41)
Albumin: 2.5 g/dL — ABNORMAL LOW (ref 3.5–5.0)
Alkaline Phosphatase: 83 U/L (ref 38–126)
Anion gap: 9 (ref 5–15)
BUN: 9 mg/dL (ref 8–23)
CO2: 29 mmol/L (ref 22–32)
Calcium: 9.1 mg/dL (ref 8.9–10.3)
Chloride: 101 mmol/L (ref 98–111)
Creatinine, Ser: 1.02 mg/dL — ABNORMAL HIGH (ref 0.44–1.00)
GFR, Estimated: >60 mL/min (ref >60)
Glucose, Bld: 116 mg/dL — ABNORMAL HIGH (ref 70–99)
Potassium: 2.9 mmol/L — ABNORMAL LOW (ref 3.5–5.1)
Sodium: 139 mmol/L (ref 135–145)
Total Bilirubin: 0.7 mg/dL (ref 0.3–1.2)
Total Protein: 6.5 g/dL (ref 6.5–8.1)

## 2023-06-06 LAB — BASIC METABOLIC PANEL
Anion gap: 9 (ref 5–15)
BUN: 9 mg/dL (ref 8–23)
CO2: 25 mmol/L (ref 22–32)
Calcium: 8.5 mg/dL — ABNORMAL LOW (ref 8.9–10.3)
Chloride: 104 mmol/L (ref 98–111)
Creatinine, Ser: 0.99 mg/dL (ref 0.44–1.00)
GFR, Estimated: >60 mL/min (ref >60)
Glucose, Bld: 137 mg/dL — ABNORMAL HIGH (ref 70–99)
Potassium: 3.5 mmol/L (ref 3.5–5.1)
Sodium: 138 mmol/L (ref 135–145)

## 2023-06-06 LAB — MAGNESIUM: Magnesium: 2.3 mg/dL (ref 1.7–2.4)

## 2023-06-06 MED ORDER — POTASSIUM CHLORIDE 10 MEQ/100ML IV SOLN
10.0000 meq | INTRAVENOUS | Status: AC
  Administered 2023-06-06 (×4): 10 meq via INTRAVENOUS
  Filled 2023-06-06 (×3): qty 100

## 2023-06-06 MED ORDER — AMOXICILLIN-POT CLAVULANATE 875-125 MG PO TABS
1.0000 | ORAL_TABLET | Freq: Two times a day (BID) | ORAL | Status: DC
  Administered 2023-06-06 – 2023-06-07 (×3): 1 via ORAL
  Filled 2023-06-06 (×3): qty 1

## 2023-06-06 NOTE — Progress Notes (Signed)
   06/06/23 1133  Mobility  Activity Ambulated independently in room  Level of Assistance Standby assist, set-up cues, supervision of patient - no hands on  Assistive Device Other (Comment) (IV Pole)  Distance Ambulated (ft) 30 ft  Activity Response Tolerated fair  Mobility Referral Yes  $Mobility charge 1 Mobility  Mobility Specialist Start Time (ACUTE ONLY) 1112  Mobility Specialist Stop Time (ACUTE ONLY) 1132  Mobility Specialist Time Calculation (min) (ACUTE ONLY) 20 min   Mobility Specialist: Progress Note  Nurse requested Mobility Specialist to perform oxygen saturation test with pt which includes removing pt from oxygen both at rest and while ambulating.  Below are the results from that testing.     Patient Saturations on Room Air at Rest = spO2 96%  Patient Saturations on Room Air while Ambulating = sp02 92% .  At end of testing pt left in room on 97% RA%  Immediatly reported results to nurse.   Pt agreeable to mobility session - received in bed. Required SB pushing IV Pole. C/o feeling winded post ambulation, with consistent cough. Returned to EOB with all needs met - call bell within reach.   Pt stated she feels winded for the past six months when climbing a flight of steps to her bedroom at home. She stated she "takes a break after every forth step with SOB at top of stairs".   [name], BS Mobility Specialist Please contact via SecureChat or Rehab office at [phone].

## 2023-06-06 NOTE — Progress Notes (Signed)
HD#1 SUBJECTIVE:  Patient Summary: [name] is a 67 y.o. with a pertinent PMH of HFpEF (EF 55-60%), DVT/PE (+/- provoked), HLD, HTN, OSA, prediabetes, who presented to a stand-alone ED with complaints of dyspnea, cough, congestion, chest pain admitted for CAP. She initially had elevated D dimer but CTA chest was negative for PE. Imaging did show R middle and upper lobe pneumonia. COVID-19, RSV, influenza negative. Leukocytosis to 17.5. EKG did not show acute ischemia and troponins were negative. She was started on IV rocephin and azithromycin and transferred to [facility] for admission for CAP.   Overnight Events: Transferred and admitted to [facility].  Interim History: Patient is feeling much better this morning as compared to when she was admitted but still has a bothersome cough.  OBJECTIVE:  Vital Signs: Vitals:   06/05/23 2110 06/05/23 2148 06/06/23 0500 06/06/23 0522  BP: (!) 87/52 (!) 106/48  (!) 98/51  Pulse: 73 73  73  Resp: 17 18  17   Temp: 99 F (37.2 C) 99 F (37.2 C)  98.7 F (37.1 C)  TempSrc: Oral Oral  Oral  SpO2: 99% 99%  99%  Weight:   98.9 kg    Supplemental O2: Room Air SpO2: 99 % O2 Flow Rate (L/min): 2 L/min FiO2 (%): 28 %  Filed Weights   06/06/23 0500  Weight: 98.9 kg    Intake/Output Summary (Last 24 hours) at 06/06/2023 0741 Last data filed at 06/06/2023 0400 Gross per 24 hour  Intake 1550 ml  Output --  Net 1550 ml   Net IO Since Admission: 2,402.8 mL [06/06/23 0741]  Physical Exam: Constitutional:Appears stated age, laying in bed comfortably. In no acute distress. Cardio:Regular rate and rhythm. No murmurs, rubs, or gallops. No JVD or peripheral edema. Pulm:Clear to auscultation bilaterally. Normal work of breathing on 2L [place]. Abdomen:Soft, nontender, nondistended. MSK/Skin:Warm and dry without edema. Neuro:Alert and oriented x3. No focal deficit noted. Psych:Pleasant mood and affect.  Patient Lines/Drains/Airways Status      Active Line/Drains/Airways     Name Placement date Placement time Site Days   Peripheral IV 06/04/23 20 G Left Antecubital 06/04/23  1547  Antecubital  2            ASSESSMENT/PLAN:  Assessment: Principal Problem:   CAP (community acquired pneumonia) Active Problems:   Hypokalemia   Plan: Community acquired pneumonia, R middle and upper lobe Mildly enlarged mediastinal lymph nodes, likely reactive Following recent exposure to her grandchild with URI symptoms. COVID-19, flu negative. Not on supplemental O2 as outpatient but requiring this here. Leukocytosis has improved 17.5 > 9.6. Mild hypotension, afebrile. Currently receiving rocephin and azithromycin. Admitting provider ordered RVP, urine strep and legionella studies, sputum cultures: RVP negative; strep pneumoniae urinary antigen negative, legionella urinary antigen pending; sputum culture sample "not acceptable for testing," do not feel this needs to be recollected at this time. O2 saturation 99% on 2L [place] on my exam, in no acute respiratory distress. Plan: -Antibiotics: transition from ceftriaxone and azithromycin to augmentin 875-125 mg BID for 5 days (total 7 days of treatment) -Supportive care with mucinex, benzonatate, nebulizers PRN -Continue incentive spirometry, flutter valve -Wean off O2 as tolerated  Hx PE Hx DVT Elevated D-dimer Presented with chest pain felt to be musculoskeletal in nature, associated with cough. Asymptomatic now, troponins negative, and no acute ischemic changes on EKG. CTA chest negative for PE. No physical exam findings indicative of DVT. On xarelto OP, has not missed any doses. Plan: -Continue home  xarelto  OSA Plan: -CPAP daily at bedtime  Hx HFpEF (LV EF 55-60%) Minimal elevation of BNP to 143.9 on admission. Clinically euvolemic.  Plan: -Monitor volume status  HTN Home carvedilol, amlodipine, chlorthalidone held at admission in setting of mild hypotension. BP today remains soft  with MAP 65. Plan: -Continue to hold home anti-hypertensives -Encourage PO intake  AKI, resolved Hypokalemia Mild bump in serum creatinine on admission to 1.24 from baseline 0.9-1.0, GFR 48 from around 60. Improved further today to serum creatinine of 1.02 and GFR 54. Home chlorthalidone held. Potassium remains low at 2.9 today with normal magnesium. She does not take supplemental potassium outpatient but explains that she was taking a BP medicine that helped her keep her potassium levels in normal range in the past but that this was discontinued. Has not received chlorthalidone while admitted and not on other medications that could precipitate hypokalemia. Denis vomiting or diarrhea. K remaining low despite PO supplementation (40 mg BID). Plan: -Continue PO potassium 40 mEq BID -Will also give IV K 40 mEq x1 dose, repeat BMP this afternoon to assess response -Trend renal function, replete electrolytes as indicated -Encourage PO hydration  Elevated AST AST 44, no prior history of elevated level. Less than 2:1 ratio. No abdominal pain or complaints today. Plan: -Trend hepatic enzymes  HLD Plan: -Continue home atorvastatin  GERD Plan: -Continue home protonix  Best Practice: Diet: Regular diet IVF: None VTE: SCDs Start: 06/05/23 0335 Code: Full AB: Augmentin 875-125 mg BID DISPO: Anticipated discharge  1-2 days  to Home pending  medical stability .  Signature: Champ Mungo, D.O.  Internal Medicine Resident, PGY-3 [facility]  Pager: [phone]  Please contact the on call pager after 5 pm and on weekends at [phone].

## 2023-06-07 ENCOUNTER — Other Ambulatory Visit (HOSPITAL_COMMUNITY): Payer: Self-pay

## 2023-06-07 ENCOUNTER — Other Ambulatory Visit: Payer: Self-pay | Admitting: Student

## 2023-06-07 DIAGNOSIS — E876 Hypokalemia: Secondary | ICD-10-CM

## 2023-06-07 LAB — CBC
HCT: 37.2 % (ref 36.0–46.0)
Hemoglobin: 12 g/dL (ref 12.0–15.0)
MCH: 26.1 pg (ref 26.0–34.0)
MCHC: 32.3 g/dL (ref 30.0–36.0)
MCV: 81 fL (ref 80.0–100.0)
Platelets: 253 10*3/uL (ref 150–400)
RBC: 4.59 MIL/uL (ref 3.87–5.11)
RDW: 14 % (ref 11.5–15.5)
WBC: 10.1 10*3/uL (ref 4.0–10.5)
nRBC: 0 % (ref 0.0–0.2)

## 2023-06-07 LAB — COMPREHENSIVE METABOLIC PANEL
ALT: 56 U/L — ABNORMAL HIGH (ref 0–44)
AST: 69 U/L — ABNORMAL HIGH (ref 15–41)
Albumin: 2.6 g/dL — ABNORMAL LOW (ref 3.5–5.0)
Alkaline Phosphatase: 89 U/L (ref 38–126)
Anion gap: 14 (ref 5–15)
BUN: 9 mg/dL (ref 8–23)
CO2: 24 mmol/L (ref 22–32)
Calcium: 9.7 mg/dL (ref 8.9–10.3)
Chloride: 102 mmol/L (ref 98–111)
Creatinine, Ser: 0.91 mg/dL (ref 0.44–1.00)
GFR, Estimated: >60 mL/min (ref >60)
Glucose, Bld: 110 mg/dL — ABNORMAL HIGH (ref 70–99)
Potassium: 3.8 mmol/L (ref 3.5–5.1)
Sodium: 140 mmol/L (ref 135–145)
Total Bilirubin: 0.8 mg/dL (ref 0.3–1.2)
Total Protein: 6.3 g/dL — ABNORMAL LOW (ref 6.5–8.1)

## 2023-06-07 LAB — EXPECTORATED SPUTUM ASSESSMENT W GRAM STAIN, RFLX TO RESP C

## 2023-06-07 LAB — MAGNESIUM: Magnesium: 2 mg/dL (ref 1.7–2.4)

## 2023-06-07 MED ORDER — POTASSIUM CHLORIDE CRYS ER 10 MEQ PO TBCR
10.0000 meq | EXTENDED_RELEASE_TABLET | Freq: Every day | ORAL | 0 refills | Status: DC
  Filled 2023-06-07: qty 7, 7d supply, fill #0

## 2023-06-07 MED ORDER — AMOXICILLIN-POT CLAVULANATE 875-125 MG PO TABS
1.0000 | ORAL_TABLET | Freq: Two times a day (BID) | ORAL | 0 refills | Status: DC
  Filled 2023-06-07: qty 7, 4d supply, fill #0

## 2023-06-07 MED ORDER — POTASSIUM CHLORIDE CRYS ER 10 MEQ PO TBCR
10.0000 meq | EXTENDED_RELEASE_TABLET | Freq: Every day | ORAL | Status: DC
  Administered 2023-06-07: 10 meq via ORAL
  Filled 2023-06-07: qty 1

## 2023-06-07 MED ORDER — GUAIFENESIN ER 600 MG PO TB12
600.0000 mg | ORAL_TABLET | Freq: Two times a day (BID) | ORAL | 0 refills | Status: AC
  Filled 2023-06-07: qty 30, 15d supply, fill #0

## 2023-06-07 MED ORDER — CARVEDILOL 6.25 MG PO TABS
6.2500 mg | ORAL_TABLET | Freq: Two times a day (BID) | ORAL | Status: DC
  Administered 2023-06-07: 6.25 mg via ORAL
  Filled 2023-06-07: qty 1

## 2023-06-07 NOTE — Hospital Course (Addendum)
Patent is a 67 yo with a medical history of HFpEF (EF 55-60%), DVT/PE, HLD, HTN, OSA, and prediabetes who presented to [facility] ED on 10/16 with dyspnea, cough, congestion, and chest pain/pressure and admitted for CAP. Exposed to grandchild with URI symptoms. Initially elevated D-dimer but imaging negative for PE. Imaging showed R middle and upper lobe pneumonia. RVP/COVID/influenza negative. WBC 17.5. EKG negative for acute ischemia, troponins peaked. Started on IV rocephin and azithromycin and transferred to [facility] on 10/18. Patient admitted to the Internal Medicine Teaching Service for CAP.   Community acquired pneumonia, R middle and upper lobe Mildly enlarged mediastinal lymph nodes, likely reactive Started on ceftriaxone and azithromycin on admission 10/16, transitioned to augmentin BID 10/18 for 5 days (7 days total of treatment 10/16-10/22, Augmentin 10/18-10/22) on admission to IMT[facility]. Strep pneumoniae urinary antigen negative, legionella urinary antigen negative; sputum culture sample not recollected.    Not on supplemental O2 at home, initially placed on supplemental O2 as needed for SOB, weaned to room air. Provided supportive care with mucinex, benzonatate, nebulizers PRN, and incentive spirometry with flutter valve. Leukocytosis improved, remained WNL. Patient was afebrile, BP 90s-130s/50s-70s, HR WNL. Patient with improved cough, SOB, and chest pressure on day od discharge. Ambulatory pulse ox performed with mobility specialist, with patient saturations on room air at rest spO2 96%, while ambulating spO2 86% with quick recovery to SpO2 95-96% RA. Patient will continue to take Augmentin 875-125 mg BID on discharge, ordered Mucinex for symptomatic treatment.     Hx PE Hx DVT Elevated D-dimer at ED Remained asymptomatic. Denied missed doses of Xarelto. No acute ischemic changes on EKG, CTA chest negative for PE. No physical exam findings indicative of DVT. Patient resumed on home Xarelto  on admission.    OSA Chronic, resumed CPAP daily at bedtime.   Hx HFpEF (LV EF 55-60%) Minimal elevation of BNP of 143.9 on admission. Appeared clinically euvolemic on day of discharge.   HTN Home carvedilol, amlodipine, chlorthalidone held at admission in setting of mild hypotension. BP ranged between 90s-130s/50s-70s. BP came up to 120/75 on day of discharge, resumed carvedilol 6.25 mg BID. Continued to hold amlodipine and chlorthalidone on day of discharge.    AKI, resolved Hypokalemia resolved Mild bump in serum creatinine on admission to 1.24 from baseline 0.9-1.0, back to 0.91 on day of discharge. Potassium 10/18 low at 2.9 with normal magnesium. Potassium repleted with BID Potassium 40 mEq. Potassium on day of discharge of 3.8. Started patient on daily potassium 10 mEq with instructions to return to [facility] clinic for lab visit in 7 days (10/25).     Elevated AST AST 44 on 10/18 no prior history of elevated level. Less than 2:1 ratio. LFTs remained slightly elevated. No abdominal pain or related complaints. Consider CMP and RUQ [place] outpatient if continue to be elevated at hospitalization f/u visit.    HLD Chronic, continued home atorvastatin.  GERD Chronic, continued home protonix.

## 2023-06-07 NOTE — Discharge Summary (Signed)
Name: [name] MRN: [id number] DOB: Feb 08, 1956 67 y.o. PCP: Rudene Christians, DO  Date of Admission: 06/04/2023  3:22 PM Date of Discharge: 06/07/2023 10:08 AM Attending Physician: Dr. Criselda Peaches  Discharge Diagnosis: Principal Problem:   CAP (community acquired pneumonia) Active Problems:   Hypokalemia    Discharge Medications: Allergies as of 06/07/2023       Reactions   Tramadol Nausea And Vomiting   Ciprofloxacin Rash     Med Rec must be completed [facility] to [facility]       Disposition and follow-up:   [name] was discharged from [facility] in {DISCHARGE CONDITION:19696} condition.  At the hospital follow up visit please address:  1.  Follow-up:  * -   * -   * -   * -     2.  Labs / imaging needed at time of follow-up: BMP/K,   3.  Pending labs/ test needing follow-up: n/a  4.  Medication Changes  STOPPED  - Amlodipine  - Chlorthalidone  - Lasix (reported as not taking)   ADDED  - Augmentin  - Potassium chloride tablet   - PRN cough relief   MODIFIED  - n/a  Follow-up Appointments:  Patient instructed to call [facility] at [phone] to schedule a hospitalization follow-up visit.   Hospital Course by problem list: [name] is a 67 y.o. with a pertinent PMH of HFpEF (EF 55-60%), DVT/PE (+/- provoked), HLD, HTN, OSA, prediabetes, who presented to a stand-alone ED 10/16 with complaints of dyspnea, cough, congestion, chest pain admitted for CAP. She initially had elevated D dimer but CTA chest was negative for PE. Imaging did show R middle and upper lobe pneumonia. COVID-19, RSV, influenza negative. Leukocytosis to 17.5. EKG did not show acute ischemia and troponins were negative. She was started on IV rocephin and azithromycin and transferred to [facility] 10/18 for admission for CAP.    PATIENT INSTRUCTIONS:   - You were seen for shortness of breath, cough, and congestion and  treated for Community Acquired Pneumonia. You were started on IV antibiotics in the ED and transitioned to an oral antibiotic when you were admitted to [facility]. You were also treated for low potassium, which was repleted as needed.   - We started you on an oral antibiotic on 10/18 that you will need to continue taking when you leave the hospital. You will take Augmentin, one pill twice a day (every 12 hours), until 10/22. You can continue to take Mucinex over the counter as needed for symptom relief (please follow medication instructions).   - We held some of your blood pressure medications when you were admitted given that your blood pressures were on the lower side. Your blood pressure has started to come up again, so we resumed your carvedilol 6.25 mg twice a day. You will NOT take your amlodipine OR the chlorthalidone until you can come in for your hospitalization follow-up visit and discuss with your primary care provider.   - Please come in for a lab visit at the Internal Medicine Center clinic at [facility] on Friday 10/25 for labs. We will be checking your potassium in order to let you know whether you need to continue taking your daily potassium pill.   - Please call the Internal Medicine Center clinic at [facility] at [phone] to make a hospitalization follow-up appointment within the next 10-14 days with your PCP Dr. Rudene Christians.   -  Please call 911 or go to the nearest emergency room if you are feeling sudden shortness of breath, chest pain, have confusion (don't know here you are, experience speech changes, etc.), or if you have nausea or diarrhea and are unable to continue eating and drinking to stay hydrated.    - We are glad you are feeling better. It was a pleasure serving you during your stay. - Dr. Justin Mend and the Internal Medicine Teaching Service team    Discharge Subjective:   Discharge Exam:   Blood pressure 120/75, pulse 77, temperature 98.5 F (36.9  C), temperature source Oral, resp. rate 18, weight 99.7 kg, SpO2 97%.  Constitutional:***ll-appearing *** sitting in ***, in no acute distress HENT: normocephalic atraumatic, mucous membranes moist Cardiovascular: regular rate and rhythm, no m/r/g, *** JVD Pulmonary/Chest: normal work of breathing on room air, lungs clear to auscultation bilaterally. ***crackles  Abdominal: soft, non-tender, non-distended. ***fluid wave ***asterixis Neurological: alert & oriented x 3 MSK: no gross abnormalities. ***pitting edema Skin: warm and dry Psych: Normal mood and affect  Pertinent Labs, Studies, and Procedures:     Latest Ref Rng & Units 06/07/2023    2:43 AM 06/06/2023    7:20 AM 06/05/2023    6:44 AM  CBC  WBC 4.0 - 10.5 K/uL 10.1  9.6  16.2   Hemoglobin 12.0 - 15.0 g/dL 16.1  09.6  04.5   Hematocrit 36.0 - 46.0 % 37.2  36.0  35.5   Platelets 150 - 400 K/uL 253  246  219        Latest Ref Rng & Units 06/07/2023    2:43 AM 06/06/2023    6:30 PM 06/06/2023    7:20 AM  CMP  Glucose 70 - 99 mg/dL 409  811  914   BUN 8 - 23 mg/dL 9  9  9    Creatinine 0.44 - 1.00 mg/dL 7.82  9.56  2.13   Sodium 135 - 145 mmol/L 140  138  139   Potassium 3.5 - 5.1 mmol/L 3.8  3.5  2.9   Chloride 98 - 111 mmol/L 102  104  101   CO2 22 - 32 mmol/L 24  25  29    Calcium 8.9 - 10.3 mg/dL 9.7  8.5  9.1   Total Protein 6.5 - 8.1 g/dL 6.3   6.5   Total Bilirubin 0.3 - 1.2 mg/dL 0.8   0.7   Alkaline Phos 38 - 126 U/L 89   83   AST 15 - 41 U/L 69   54   ALT 0 - 44 U/L 56   37     CT Angio Chest PE W and/or Wo Contrast  Result Date: 06/04/2023 CLINICAL DATA:  Cough congestion EXAM: CT ANGIOGRAPHY CHEST WITH CONTRAST TECHNIQUE: Multidetector CT imaging of the chest was performed using the standard protocol during bolus administration of intravenous contrast. Multiplanar CT image reconstructions and MIPs were obtained to evaluate the vascular anatomy. RADIATION DOSE REDUCTION: This exam was performed according  to the departmental dose-optimization program which includes automated exposure control, adjustment of the mA and/or kV according to patient size and/or use of iterative reconstruction technique. CONTRAST:  OMNIPAQUE IOHEXOL 350 MG/ML SOLN COMPARISON:  Chest x-ray 06/04/2023, chest CT 06/24/2022, 126 2016 FINDINGS: Cardiovascular: Satisfactory opacification of the pulmonary arteries to the segmental level. No evidence of pulmonary embolism. Normal heart size. No pericardial effusion. Nonaneurysmal aorta. No dissection is seen. Mediastinum/Nodes: Midline trachea. No thyroid mass. Mildly enlarged mediastinal lymph nodes. Right  paratracheal node measures 11 mm. Precarinal lymph node measures 9 mm. Esophagus within normal limits. Lungs/Pleura: Heterogeneous consolidation in the right middle and upper lobes consistent with pneumonia. No pleural effusion or pneumothorax Upper Abdomen: No acute abnormality. Musculoskeletal: No chest wall abnormality. No acute or significant osseous findings. Review of the MIP images confirms the above findings. IMPRESSION: 1. Negative for acute pulmonary embolus. 2. Heterogeneous consolidation in the right middle and upper lobes consistent with pneumonia. Imaging follow-up to resolution is recommended 3. Mildly enlarged mediastinal lymph nodes, likely reactive. Electronically Signed   By: Jasmine Pang M.D.   On: 06/04/2023 21:05   DG Chest Port 1 View  Result Date: 06/04/2023 CLINICAL DATA:  Shortness of breath EXAM: PORTABLE CHEST 1 VIEW COMPARISON:  Same day CTA chest, chest radiograph 06/22/2022 FINDINGS: The heart is enlarged.  The upper mediastinal contours are normal. There is consolidative opacity in the right perihilar region, better assessed on subsequently obtained CTA chest. There is no definite pulmonary edema. There is no appreciable pleural effusion. There is no pneumothorax There is no acute osseous abnormality. IMPRESSION: Consolidative opacity in the right  perihilar region, better assessed on subsequently obtained CTA chest. Electronically Signed   By: Lesia Hausen M.D.   On: 06/04/2023 20:11     Discharge Instructions:   Signed: Talar Fraley Colbert Coyer, MD [facility] - PGY1 Pager: [phone] 06/07/2023, 10:08 AM    Please contact the on call pager after 5 pm and on weekends at [phone].

## 2023-06-07 NOTE — Progress Notes (Signed)
Summary: Patient is a 67 yo with a history of HFpEF (EF 55-60%), DVT/PE (+/- provoked), HLD, HTN, OSA, prediabetes who presented to stand-alone ED on 10/16 with dyspnea, cough, congestion, and chest pain after exposure to grandchild with URI. Admitted to Internal Medicine Teaching Service 10/18 for further CAP management.   CTA chest negative for PE. Imaging showed R middle and upper lobe pneumonia. RVP negative. EKG not concerning for ischemic changes, troponins not elevated. She was started on IV rocephin and azithromycin in the ED and transferred to [facility] for admission.   Subjective:  No overnight events reported. Patient reports feeling better this morning, still coughing but shortness of breath is improving. States she had 1x loose BM yesterday but has been eating and drinking ok. Denies any CP, N/V, abdominal pain, or dysuria. Reviewed plan and instructions for medications on discharge. Feels ok to go home today.   Objective:  Vital signs in last 24 hours: Vitals:   06/06/23 2027 06/07/23 0500 06/07/23 0520 06/07/23 0747  BP: (!) 106/57  106/73 120/75  Pulse: 79  80 77  Resp: 19  17 18   Temp: 98.7 F (37.1 C)  98.6 F (37 C) 98.5 F (36.9 C)  TempSrc: Oral  Oral Oral  SpO2: 97%  97% 97%  Weight:  99.7 kg        Latest Ref Rng & Units 06/07/2023    2:43 AM 06/06/2023    7:20 AM 06/05/2023    6:44 AM  CBC  WBC 4.0 - 10.5 K/uL 10.1  9.6  16.2   Hemoglobin 12.0 - 15.0 g/dL 16.1  09.6  04.5   Hematocrit 36.0 - 46.0 % 37.2  36.0  35.5   Platelets 150 - 400 K/uL 253  246  219        Latest Ref Rng & Units 06/07/2023    2:43 AM 06/06/2023    6:30 PM 06/06/2023    7:20 AM  BMP  Glucose 70 - 99 mg/dL 409  811  914   BUN 8 - 23 mg/dL 9  9  9    Creatinine 0.44 - 1.00 mg/dL 7.82  9.56  2.13   Sodium 135 - 145 mmol/L 140  138  139   Potassium 3.5 - 5.1 mmol/L 3.8  3.5  2.9   Chloride 98 - 111 mmol/L 102  104  101   CO2 22 - 32 mmol/L 24  25  29    Calcium 8.9 -  10.3 mg/dL 9.7  8.5  9.1      Physical Exam Constitutional: Resting in bed in no acute distress, conversing appropriately.  CV: RRR, no r/m/g, no LE edema. Pulmonary/Respiratory: Normal respiratory effort on 2L O2 via nasal cannula, slightly decreased breath sounds bilateral posterior lobes, no crackles.  Abdominal: Soft, non-tender, non-distended.  Neuro: Alert and oriented, answering and asking questions appropriately.   Psych: Normal mood and affect.   Assessment/Plan:  Principal Problem:   CAP (community acquired pneumonia) Active Problems:   Hypokalemia  Community acquired pneumonia, R middle and upper lobe Mildly enlarged mediastinal lymph nodes, likely reactive Started on ceftriaxone and azithromycin on admission 10/16, transitioned to augmentin BID 10/18 for 5 days (7 days total of treatment 10/16-10/22).   Not on supplemental O2 at home, now on room air. Leukocytosis improved, remains WNL. Remains afebrile, BP 90s-130s/50s-70s, HR WNL.   RVP negative; strep pneumoniae urinary antigen negative, legionella urinary antigen pending; sputum culture sample not recollected.   Patient appears medically  stable for discharge, will continue to take antibiotics outpatient, will continue symptomatic treatment.   Plan: -Augmentin 875-125 mg BID for 5 days (10/18-10/22) -Supportive care with mucinex, benzonatate, nebulizers PRN -Continue incentive spirometry, flutter valve -F/u ambulatory pulse ox  Hx PE Hx DVT Elevated D-dimer Remains asymptomatic. No acute ischemic changes on EKG, CTA chest negative for PE. No physical exam findings indicative of DVT. On xarelto OP, has not missed any doses. Plan: -Continue home xarelto 20 mg daily    OSA Plan: -CPAP daily at bedtime   Hx HFpEF (LV EF 55-60%) Minimal elevation of BNP to 143.9 on admission. Clinically euvolemic on exam today.  Plan: -Monitor volume status   HTN Home carvedilol, amlodipine, chlorthalidone held at  admission in setting of mild hypotension. BP ranges 90s-130s/50s-70s. Last BP 120/75. Plan: -Resume carvedilol 6.25 mg BID, continue to hold amlodipine and chlorthalidone  -Encourage PO intake   AKI, resolved Hypokalemia resolved Mild bump in serum creatinine on admission to 1.24 from baseline 0.9-1.0, now 0.91. Potassium 10/18 low at 2.9 with normal magnesium. Potassium repleted with BID Potassium 40 mEq. Today K 3.8.  - Lab visit 10/25 at [facility] for BMP/K check  - Daily 10 mEq until lab visit/follow-up  Plan: -Continue PO potassium 40 mEq BID, consider daily maintenance K  -Will also give IV K 40 mEq x1 dose, repeat BMP this afternoon to assess response -Trend renal function, replete electrolytes as indicated -Encourage PO hydration   Elevated AST AST 44 on 10/18 no prior history of elevated level. Less than 2:1 ratio. LFTs remain slightly elevated. No abdominal pain or complaints today. Consider RUQ [place] outpatient if continue to be elevated at hospitalization f/u visit.  Plan: -Trend hepatic enzymes   HLD Plan: -Continue home atorvastatin   GERD Plan: -Continue home protonix   Best Practice: Diet: Regular diet IVF: None VTE: SCDs Start: 06/05/23 0335 Code: Full  Prior to Admission Living Arrangement: Home  Anticipated Discharge Location: Home Barriers to Discharge: Medical management  Dispo: Anticipated discharge in approximately 1-2 day(s).   Philomena Doheny, MD, PGY-1 06/07/2023, 9:49 AM Pager: [phone] After 5pm on weekdays and 1pm on weekends: On Call pager [phone]

## 2023-06-07 NOTE — Plan of Care (Signed)
completed

## 2023-06-07 NOTE — Discharge Instructions (Addendum)
PATIENT INSTRUCTIONS:   - You were seen for shortness of breath, cough, and congestion and treated for Community Acquired Pneumonia. You were started on IV antibiotics in the ED and transitioned to an oral antibiotic when you were admitted to [facility]. You were also treated for low potassium, which was repleted as needed.   - We started you on an oral antibiotic on 10/18 that you will need to continue taking when you leave the hospital. You will take Augmentin, one pill twice a day (every 12 hours), until 10/22. You can take Mucinex as needed for symptom relief (please follow medication instructions).   - We held some of your blood pressure medications when you were admitted given that your blood pressures were on the lower side. Your blood pressure has started to come up again, so we resumed your carvedilol 6.25 mg twice a day. You will NOT take your amlodipine OR the chlorthalidone until you can come in for your hospitalization follow-up visit and discuss with your primary care provider.   - Please come in for a lab visit at the Internal Medicine Center clinic at [facility] on Friday 10/25 between 8 AM-11:30 AM. We will be checking your potassium in order to let you know whether you need to continue taking your daily potassium pill.   - Please check your BP at home, if over 160/100 please call our clinic at [phone] and we may bring you in earlier or we may instruct you to restart one of your other blood pressure medications.   - Please call the Internal Medicine Center clinic at [facility] at [phone] to make a hospitalization follow-up appointment within the next 10-14 days with your PCP Dr. Rudene Christians.   - Please call 911 or go to the nearest emergency room if you are feeling sudden shortness of breath, chest pain, have confusion (don't know here you are, experience speech changes, etc.), or if you have nausea or diarrhea and are unable to continue eating and drinking to  stay hydrated.    - We are glad you are feeling better. It was a pleasure serving you during your stay. - Dr. Justin Mend and the Internal Medicine Teaching Service team

## 2023-06-07 NOTE — Progress Notes (Addendum)
   06/07/23 1112  Mobility  Activity Ambulated independently in hallway  Level of Assistance Standby assist, set-up cues, supervision of patient - no hands on  Assistive Device None  Distance Ambulated (ft) 800 ft  Activity Response Tolerated fair  Mobility Referral Yes  $Mobility charge 1 Mobility  Mobility Specialist Start Time (ACUTE ONLY) 1045  Mobility Specialist Stop Time (ACUTE ONLY) 1110  Mobility Specialist Time Calculation (min) (ACUTE ONLY) 25 min   Mobility Specialist: Progress Note Nurse requested Mobility Specialist to perform oxygen saturation test with pt which includes removing pt from oxygen both at rest and while ambulating.  Below are the results from that testing.     Patient Saturations on Room Air at Rest = spO2 96%  Patient Saturations on Room Air while Ambulating = spO2 86% . Quick recovery to SpO2 95-96% RA with pursed lip breathing during ambulation.   spO2 at 1 minute = 96% RA spO2 at 3 minutes = 95% RA spO2 at 5 minutes = 96% RA  At end of testing pt left in room on RA.   Pt agreeable to mobility session - received in EOB. Required SB with no AD. Pt with no complaints slight SOB. Returned to EOB with all needs met - call bell within reach.  Barnie Mort, BS Mobility Specialist Please contact via SecureChat or Rehab office at [phone].

## 2023-06-08 LAB — LEGIONELLA PNEUMOPHILA SEROGP 1 UR AG: L. pneumophila Serogp 1 Ur Ag: NEGATIVE

## 2023-06-09 ENCOUNTER — Telehealth: Payer: Self-pay

## 2023-06-09 NOTE — Transitions of Care (Post Inpatient/ED Visit) (Signed)
06/09/2023  Name: [name] MRN: [id number] DOB: 08-05-1956  Today's TOC FU Call Status: Today's TOC FU Call Status:: Successful TOC FU Call Completed TOC FU Call Complete Date: 06/09/23 Patient's Name and Date of Birth confirmed.  Transition Care Management Follow-up Telephone Call Date of Discharge: 06/07/23 Discharge Facility: [facility]) Type of Discharge: Inpatient Admission Primary Inpatient Discharge Diagnosis:: pneumonia How have you been since you were released from the hospital?: Better Any questions or concerns?: No  Items Reviewed: Did you receive and understand the discharge instructions provided?: Yes Medications obtained,verified, and reconciled?: Yes (Medications Reviewed) Any new allergies since your discharge?: No Dietary orders reviewed?: Yes Do you have support at home?: Yes People in Home: child(ren), adult  Medications Reviewed Today: Medications Reviewed Today     Reviewed by Karena Addison, LPN (Licensed Practical Nurse) on 06/09/23 at 1053  Med List Status: <None>   Medication Order Taking? Sig Documenting Provider Last Dose Status Informant  acetaminophen (TYLENOL) 325 MG tablet [id number] Yes Take 325 mg by mouth at bedtime as needed for mild pain (pain score 1-3) or moderate pain (pain score 4-6). [provider] Taking Active Self, Pharmacy Records  albuterol (VENTOLIN HFA) 108 (90 Base) MCG/ACT inhaler [id number] Yes Inhale 2 puffs into the lungs every 6 (six) hours as needed for wheezing or shortness of breath. Willette Cluster, MD Taking Active Self, Pharmacy Records  amoxicillin-clavulanate (AUGMENTIN) 875-125 MG tablet [id number] Yes Take 1 tablet by mouth every 12 (twelve) hours. Philomena Doheny, MD Taking Active   atorvastatin (LIPITOR) 40 MG tablet [id number] Yes Take 1 tablet (40 mg total) by mouth daily.  Patient taking differently: Take 40 mg by mouth at bedtime.   Masters, Florentina Addison, DO Taking Active Self, Pharmacy  Records  benzonatate (TESSALON PERLES) 100 MG capsule [id number] No Take 2 capsules (200 mg total) by mouth 3 (three) times daily as needed for up to 12 doses for cough.  Patient not taking: Reported on 06/05/2023   Willette Cluster, MD Not Taking Active Self, Pharmacy Records  carvedilol (COREG) 6.25 MG tablet [id number] Yes Take 1 tablet (6.25 mg total) by mouth 2 (two) times daily with a meal. Masters, Katie, DO Taking Active Self, Pharmacy Records  fluticasone (FLONASE) 50 MCG/ACT nasal spray [id number] No Place 1 spray into both nostrils daily.  Patient not taking: Reported on 06/05/2023   Evlyn Kanner, MD Not Taking Active Self, Pharmacy Records  guaiFENesin (MUCINEX) 600 MG 12 hr tablet [id number] Yes Take 1 tablet (600 mg total) by mouth 2 (two) times daily for 15 days. Philomena Doheny, MD Taking Active   loratadine (CLARITIN) 10 MG tablet [id number] Yes Take 10 mg by mouth daily. [provider] Taking Active Self, Pharmacy Records  omeprazole (PRILOSEC) 20 MG capsule [id number] Yes Take 2 capsules (40 mg total) by mouth daily.  Patient taking differently: Take 20 mg by mouth in the morning and at bedtime.   Masters, Florentina Addison, DO Taking Active Self, Pharmacy Records  potassium chloride (KLOR-CON M) 10 MEQ tablet [id number] Yes Take 1 tablet (10 mEq total) by mouth daily. Philomena Doheny, MD Taking Active   rivaroxaban (XARELTO) 20 MG TABS tablet [id number] Yes Take 1 tablet (20 mg total) by mouth daily with supper. Gwenevere Abbot, MD Taking Active Self, Pharmacy Records            Home Care and Equipment/Supplies: Were Home Health Services Ordered?: NA Any new equipment or medical supplies ordered?: NA  Functional Questionnaire: Do you  need assistance with bathing/showering or dressing?: No Do you need assistance with meal preparation?: No Do you need assistance with eating?: No Do you have difficulty maintaining continence: No Do you need assistance with  getting out of bed/getting out of a chair/moving?: No Do you have difficulty managing or taking your medications?: No  Follow up appointments reviewed: PCP Follow-up appointment confirmed?: No (no avail appts, sent message to staff to schedule) MD Provider Line [phone] Given: No Specialist Hospital Follow-up appointment confirmed?: NA Do you need transportation to your follow-up appointment?: No Do you understand care options if your condition(s) worsen?: Yes-patient verbalized understanding    SIGNATURE Karena Addison, LPN [facility] Nurse Health Advisor Direct Dial [phone]

## 2023-06-13 ENCOUNTER — Other Ambulatory Visit (INDEPENDENT_AMBULATORY_CARE_PROVIDER_SITE_OTHER): Payer: Medicare PPO

## 2023-06-13 DIAGNOSIS — E876 Hypokalemia: Secondary | ICD-10-CM | POA: Diagnosis not present

## 2023-06-15 LAB — BMP8+ANION GAP
Anion Gap: 12 mmol/L (ref 10.0–18.0)
BUN/Creatinine Ratio: 17 (ref 12–28)
BUN: 16 mg/dL (ref 8–27)
CO2: 24 mmol/L (ref 20–29)
Calcium: 9.1 mg/dL (ref 8.7–10.3)
Chloride: 106 mmol/L (ref 96–106)
Creatinine, Ser: 0.94 mg/dL (ref 0.57–1.00)
Glucose: 100 mg/dL — ABNORMAL HIGH (ref 70–99)
Potassium: 4.6 mmol/L (ref 3.5–5.2)
Sodium: 142 mmol/L (ref 134–144)
eGFR: 67 mL/min/{1.73_m2} (ref >59)

## 2023-06-16 ENCOUNTER — Other Ambulatory Visit: Payer: Self-pay | Admitting: Student

## 2023-06-16 ENCOUNTER — Other Ambulatory Visit (HOSPITAL_COMMUNITY): Payer: Self-pay

## 2023-06-16 DIAGNOSIS — E876 Hypokalemia: Secondary | ICD-10-CM

## 2023-06-16 MED ORDER — POTASSIUM CHLORIDE CRYS ER 10 MEQ PO TBCR
10.0000 meq | EXTENDED_RELEASE_TABLET | Freq: Every day | ORAL | 0 refills | Status: DC
  Filled 2023-06-16: qty 7, 7d supply, fill #0

## 2023-06-16 NOTE — Progress Notes (Signed)
Spoke to patient on the phone, verified identity with DOB. Shared potassium 3.5 on BMP. Patient previously discharged from hospital with K 3.8 after diuretic use. Will order 10 mEq x 7 days, with repeat BMP needed at upcoming 11/6 [facility] visit. Patient acknowledged understanding, will start taking potassium today.

## 2023-06-17 ENCOUNTER — Other Ambulatory Visit: Payer: Self-pay | Admitting: Student

## 2023-06-24 NOTE — Progress Notes (Signed)
Tried calling patient multiple times following lab visit to adjust PO potassium that was started. Patient is scheduled to follow up with Dr. Rudene Christians on 11/6 at 1:15 PM, will address then and repeat labs as needed.

## 2023-06-25 ENCOUNTER — Other Ambulatory Visit (HOSPITAL_COMMUNITY): Payer: Self-pay

## 2023-06-25 ENCOUNTER — Ambulatory Visit: Payer: Medicare PPO | Admitting: Internal Medicine

## 2023-06-25 VITALS — BP 170/85 | HR 64 | Temp 98.2°F | Ht 63.0 in | Wt 220.5 lb

## 2023-06-25 DIAGNOSIS — Z0000 Encounter for general adult medical examination without abnormal findings: Secondary | ICD-10-CM

## 2023-06-25 DIAGNOSIS — J449 Chronic obstructive pulmonary disease, unspecified: Secondary | ICD-10-CM

## 2023-06-25 DIAGNOSIS — I10 Essential (primary) hypertension: Secondary | ICD-10-CM

## 2023-06-25 DIAGNOSIS — Z1231 Encounter for screening mammogram for malignant neoplasm of breast: Secondary | ICD-10-CM

## 2023-06-25 DIAGNOSIS — Z23 Encounter for immunization: Secondary | ICD-10-CM

## 2023-06-25 DIAGNOSIS — E876 Hypokalemia: Secondary | ICD-10-CM

## 2023-06-25 MED ORDER — SPIRIVA RESPIMAT 2.5 MCG/ACT IN AERS
2.0000 | INHALATION_SPRAY | Freq: Every day | RESPIRATORY_TRACT | 3 refills | Status: DC
Start: 2023-06-25 — End: 2024-07-05
  Filled 2023-06-25: qty 4, 28d supply, fill #0
  Filled 2023-12-18 – 2023-12-23 (×3): qty 4, 28d supply, fill #1

## 2023-06-25 MED ORDER — AMLODIPINE BESYLATE 5 MG PO TABS
5.0000 mg | ORAL_TABLET | Freq: Every day | ORAL | 11 refills | Status: DC
Start: 2023-06-25 — End: 2023-07-16
  Filled 2023-06-25: qty 30, 30d supply, fill #0

## 2023-06-25 NOTE — Patient Instructions (Addendum)
Thank you, Ms.[name] for allowing [place] to provide your care today.   Breathing I may take up to 6 weeks for your breathing to get better. You do have a diagnosis of COPD from your lung test studies last year. I have sent in an inhaler called spiriva, use this daily.  Blood pressure Please restart amlodipine 5 mg daily. Check you blood pressure and bring in log at follow-up.  I have placed another order for sleep studies and ordered mammogram.  Hypokalemia I am checking potassium.  I have ordered the following labs for you:  Lab Orders         BMP8+Anion Gap      Referrals ordered today:   Referral Orders         Ambulatory referral to Sleep Studies      I have ordered the following medication/changed the following medications:   Stop the following medications: Medications Discontinued During This Encounter  Medication Reason   amoxicillin-clavulanate (AUGMENTIN) 875-125 MG tablet    benzonatate (TESSALON PERLES) 100 MG capsule      Start the following medications: Meds ordered this encounter  Medications   Tiotropium Bromide Monohydrate (SPIRIVA RESPIMAT) 2.5 MCG/ACT AERS    Sig: Inhale 2 puffs into the lungs daily.    Dispense:  4 g    Refill:  3   amLODipine (NORVASC) 5 MG tablet    Sig: Take 1 tablet (5 mg total) by mouth daily.    Dispense:  30 tablet    Refill:  11     Follow up:  3 weeks if cough does not improve    We look forward to seeing you next time. Please call our clinic at [phone] if you have any questions or concerns. The best time to call is Monday-Friday from 9am-4pm, but there is someone available 24/7. If after hours or the weekend, call the main hospital number and ask for the Internal Medicine Resident On-Call. If you need medication refills, please notify your pharmacy one week in advance and they will send [place] a request.   Thank you for trusting me with your care. Wishing you the best!   Rudene Christians, DO [facility]icine [facility]

## 2023-06-25 NOTE — Progress Notes (Unsigned)
Subjective:  CC: HFU  HPI:  [name] is a 67 y.o. female with a past medical history stated below and presents today for hospital follow-up. She was admitted from 10/16-10/19 with community acquired pneumonia. She was discharged with augmentin and she completed course.   Please see problem based assessment and plan for additional details.  Past Medical History:  Diagnosis Date   (HFpEF) heart failure with preserved ejection fraction (HCC)    Acute kidney injury (HCC)    Bronchitis due to COVID-19 virus 03/23/2019   DVT (deep venous thrombosis) (HCC)    Hyperlipidemia    Hypertension    Leg pain    right   Obesity (BMI 30-39.9)    OSA (obstructive sleep apnea)    PE (pulmonary embolism)    Prediabetes    Sleep apnea    Trigger finger, left ring finger 01/04/2022    Current Outpatient Medications on File Prior to Visit  Medication Sig Dispense Refill   acetaminophen (TYLENOL) 325 MG tablet Take 325 mg by mouth at bedtime as needed for mild pain (pain score 1-3) or moderate pain (pain score 4-6).     albuterol (VENTOLIN HFA) 108 (90 Base) MCG/ACT inhaler Inhale 2 puffs into the lungs every 6 (six) hours as needed for wheezing or shortness of breath. 6.7 g 2   atorvastatin (LIPITOR) 40 MG tablet Take 1 tablet (40 mg total) by mouth daily. (Patient taking differently: Take 40 mg by mouth at bedtime.) 90 tablet 3   carvedilol (COREG) 6.25 MG tablet Take 1 tablet (6.25 mg total) by mouth 2 (two) times daily with a meal. 180 tablet 3   fluticasone (FLONASE) 50 MCG/ACT nasal spray Place 1 spray into both nostrils daily. (Patient not taking: Reported on 06/05/2023) 16 g 2   loratadine (CLARITIN) 10 MG tablet Take 10 mg by mouth daily.     omeprazole (PRILOSEC) 20 MG capsule Take 2 capsules (40 mg total) by mouth daily. (Patient taking differently: Take 20 mg by mouth in the morning and at bedtime.) 180 capsule 3   rivaroxaban (XARELTO) 20 MG TABS tablet Take 1 tablet (20  mg total) by mouth daily with supper. 90 tablet 3   No current facility-administered medications on file prior to visit.    Family History  Problem Relation Age of Onset   Hypertension Other    Heart disease Other    Diabetes Other    Coronary artery disease Other     Social History   Socioeconomic History   Marital status: Legally Separated    Spouse name: Not on file   Number of children: Not on file   Years of education: Not on file   Highest education level: Not on file  Occupational History   Not on file  Tobacco Use   Smoking status: Never   Smokeless tobacco: Never  Vaping Use   Vaping status: Never Used  Substance and Sexual Activity   Alcohol use: No   Drug use: Not on file   Sexual activity: Not Currently  Other Topics Concern   Not on file  Social History Narrative   Not on file   Social Determinants of Health   Financial Resource Strain: Medium Risk (07/02/2022)   Overall Financial Resource Strain (CARDIA)    Difficulty of Paying Living Expenses: Somewhat hard  Food Insecurity: No Food Insecurity (07/02/2022)   Hunger Vital Sign    Worried About Running Out of Food in the Last Year: Never true  Ran Out of Food in the Last Year: Never true  Transportation Needs: No Transportation Needs (07/02/2022)   PRAPARE - Administrator, Civil Service (Medical): No    Lack of Transportation (Non-Medical): No  Physical Activity: Inactive (07/02/2022)   Exercise Vital Sign    Days of Exercise per Week: 0 days    Minutes of Exercise per Session: 0 min  Stress: No Stress Concern Present (07/02/2022)   [employer] of Occupational Health - Occupational Stress Questionnaire    Feeling of Stress : Only a little  Social Connections: Moderately Integrated (07/02/2022)   Social Connection and Isolation Panel [NHANES]    Frequency of Communication with Friends and Family: Never    Frequency of Social Gatherings with Friends and Family: Once a week     Attends Religious Services: More than 4 times per year    Active Member of [employer] or Organizations: Yes    Attends Engineer, structural: More than 4 times per year    Marital Status: Married  Catering manager Violence: Not At Risk (07/02/2022)   Humiliation, Afraid, Rape, and Kick questionnaire    Fear of Current or Ex-Partner: No    Emotionally Abused: No    Physically Abused: No    Sexually Abused: No    Review of Systems: ROS negative except for what is noted on the assessment and plan.  Objective:   Vitals:   06/25/23 1309 06/25/23 1359  BP: (!) 155/85 (!) 170/85  Pulse: 68 64  Temp: 98.2 F (36.8 C)   TempSrc: Oral   SpO2: 96%   Weight: 220 lb 8 oz (100 kg)   Height: 5\' 3"  (1.6 m)     Physical Exam: Constitutional: well-appearing  Cardiovascular: regular rate and rhythm, no m/r/g Pulmonary/Chest: normal work of breathing on room air, lungs clear to auscultation bilaterally Abdominal: soft, non-tender, non-distended MSK: no lower extremity edema Neurological: normal gait Skin: warm and dry  Assessment & Plan:  Essential hypertension She was taken off blood pressure medication as BP was low during hospital admission, discharged on only carvedilol 6.25 mg BID. Previous medication included amlodpine, carvedilol and chlorthalidone. Initial blood pressure at 155/85 and increased to 170/85. She will check blood pressure at home and bring in log. P: Continue coreg 6.25 mg Restart amlodipine 5 mg Sleep study ordered as stopbang score high F/u in 1 month  COPD (chronic obstructive pulmonary disease) (HCC) Difficulty contacting her following PFT several months ago. These showed moderate COPD without response to bronchdilator. She has long standing history of cough that was thought to be upper airway syndrome versus asthama variant. Prior to admission for CAP she had baseline shortness of breath with activity. Cough is non-productive A: SOB could be from COPD versus  HFpEF. She takes lasix PRN when she notes swelling in lower extremities but this does not improve breathing when she takes it. Echo 11/23 with EF 55-60%. Right ventricular pressure was normal. P: Start spiriva 2.72mcg 2 puffs daily  Healthcare maintenance Mammogram ordered Flu vaccine and pneumonia vaccine given  Hypokalemia She had hypokalemia while in hospital. Lab only visit with normal potassium but she was given PO supplementation at that time. Resident had difficulty with getting ahold of patient to let her know to not take PO supplement. She took 7 days of PO 10 Meq and finished this 6 days ago. P: Recheck of BMP with K within normal limits. No further supplementation indicated  Patient discussed with Dr. Cleda Daub  Rudene Christians, D.O. [facility] Internal Medicine  PGY-3 Pager: [phone]  Phone: [phone] Date 06/26/2023  Time 4:51 PM

## 2023-06-26 ENCOUNTER — Encounter: Payer: Self-pay | Admitting: Internal Medicine

## 2023-06-26 DIAGNOSIS — J449 Chronic obstructive pulmonary disease, unspecified: Secondary | ICD-10-CM | POA: Insufficient documentation

## 2023-06-26 LAB — BMP8+ANION GAP
Anion Gap: 16 mmol/L (ref 10.0–18.0)
BUN/Creatinine Ratio: 11 — ABNORMAL LOW (ref 12–28)
BUN: 10 mg/dL (ref 8–27)
CO2: 22 mmol/L (ref 20–29)
Calcium: 9 mg/dL (ref 8.7–10.3)
Chloride: 106 mmol/L (ref 96–106)
Creatinine, Ser: 0.9 mg/dL (ref 0.57–1.00)
Glucose: 90 mg/dL (ref 70–99)
Potassium: 3.6 mmol/L (ref 3.5–5.2)
Sodium: 144 mmol/L (ref 134–144)
eGFR: 71 mL/min/{1.73_m2} (ref >59)

## 2023-06-26 NOTE — Assessment & Plan Note (Signed)
Mammogram ordered Flu vaccine and pneumonia vaccine given

## 2023-06-26 NOTE — Assessment & Plan Note (Signed)
She had hypokalemia while in hospital. Lab only visit with normal potassium but she was given PO supplementation at that time. Resident had difficulty with getting ahold of patient to let her know to not take PO supplement. She took 7 days of PO 10 Meq and finished this 6 days ago. P: Recheck of BMP with K within normal limits. No further supplementation indicated

## 2023-06-26 NOTE — Assessment & Plan Note (Addendum)
She was taken off blood pressure medication as BP was low during hospital admission, discharged on only carvedilol 6.25 mg BID. Previous medication included amlodpine, carvedilol and chlorthalidone. Initial blood pressure at 155/85 and increased to 170/85. She will check blood pressure at home and bring in log. P: Continue coreg 6.25 mg Restart amlodipine 5 mg Sleep study ordered as stopbang score high F/u in 1 month

## 2023-06-26 NOTE — Assessment & Plan Note (Addendum)
Difficulty contacting her following PFT several months ago. These showed moderate COPD without response to bronchdilator. She has long standing history of cough that was thought to be upper airway syndrome versus asthama variant. Prior to admission for CAP she had baseline shortness of breath with activity. Cough is non-productive A: SOB could be from COPD versus HFpEF. She takes lasix PRN when she notes swelling in lower extremities but this does not improve breathing when she takes it. Echo 11/23 with EF 55-60%. Right ventricular pressure was normal. P: Start spiriva 2.67mcg 2 puffs daily

## 2023-07-03 NOTE — Progress Notes (Signed)
Internal Medicine Clinic Attending  Case discussed with the resident at the time of the visit.  We reviewed the resident's history and exam and pertinent patient test results.  I agree with the assessment, diagnosis, and plan of care documented in the resident's note.  

## 2023-07-03 NOTE — Addendum Note (Signed)
Addended by: Lucille Passy on: 07/03/2023 02:23 PM   Modules accepted: Level of Service

## 2023-07-15 ENCOUNTER — Other Ambulatory Visit (HOSPITAL_COMMUNITY): Payer: Self-pay

## 2023-07-16 ENCOUNTER — Encounter: Payer: Self-pay | Admitting: Student

## 2023-07-16 ENCOUNTER — Other Ambulatory Visit: Payer: Self-pay

## 2023-07-16 ENCOUNTER — Ambulatory Visit: Payer: Medicare PPO | Admitting: Student

## 2023-07-16 ENCOUNTER — Other Ambulatory Visit (HOSPITAL_COMMUNITY): Payer: Self-pay

## 2023-07-16 VITALS — BP 144/72 | HR 79 | Temp 98.0°F | Ht 63.0 in | Wt 217.6 lb

## 2023-07-16 DIAGNOSIS — I110 Hypertensive heart disease with heart failure: Secondary | ICD-10-CM

## 2023-07-16 DIAGNOSIS — I5030 Unspecified diastolic (congestive) heart failure: Secondary | ICD-10-CM | POA: Diagnosis not present

## 2023-07-16 DIAGNOSIS — Z7901 Long term (current) use of anticoagulants: Secondary | ICD-10-CM

## 2023-07-16 DIAGNOSIS — J449 Chronic obstructive pulmonary disease, unspecified: Secondary | ICD-10-CM

## 2023-07-16 DIAGNOSIS — I82622 Acute embolism and thrombosis of deep veins of left upper extremity: Secondary | ICD-10-CM

## 2023-07-16 DIAGNOSIS — I10 Essential (primary) hypertension: Secondary | ICD-10-CM

## 2023-07-16 MED ORDER — AMLODIPINE BESYLATE 10 MG PO TABS
10.0000 mg | ORAL_TABLET | Freq: Every day | ORAL | 11 refills | Status: DC
  Filled 2023-07-16: qty 30, 30d supply, fill #0
  Filled 2023-09-11: qty 30, 30d supply, fill #1
  Filled 2023-10-29: qty 30, 30d supply, fill #2
  Filled 2023-12-01: qty 30, 30d supply, fill #3
  Filled 2024-01-05: qty 30, 30d supply, fill #4

## 2023-07-16 NOTE — Assessment & Plan Note (Signed)
Improved from prior; currently on amlodipine 5 mg daily and Coreg 6.25 mg daily. Asymptomatic -Increase amlodipine to 10 mg daily

## 2023-07-16 NOTE — Assessment & Plan Note (Signed)
Asymptomatic. No evidence of volume overload today -No changes in current management

## 2023-07-16 NOTE — Assessment & Plan Note (Signed)
Adherent to xarelto therapy

## 2023-07-16 NOTE — Assessment & Plan Note (Signed)
No increase in cough production, shortness of breath. Has not needed to use nebulized treatments. Adherent to therapy. -Continue spiriva

## 2023-07-16 NOTE — Progress Notes (Signed)
Subjective:  CC: Blood pressure follow up  HPI:  [name] is a 67 y.o. female with a past medical history stated below and presents today for blood pressure. Please see problem based assessment and plan for additional details.  Past Medical History:  Diagnosis Date   (HFpEF) heart failure with preserved ejection fraction (HCC)    Acute kidney injury (HCC)    Bronchitis due to COVID-19 virus 03/23/2019   DVT (deep venous thrombosis) (HCC)    Hyperlipidemia    Hypertension    Leg pain    right   Obesity (BMI 30-39.9)    OSA (obstructive sleep apnea)    PE (pulmonary embolism)    Prediabetes    Sleep apnea    Trigger finger, left ring finger 01/04/2022    Current Outpatient Medications on File Prior to Visit  Medication Sig Dispense Refill   acetaminophen (TYLENOL) 325 MG tablet Take 325 mg by mouth at bedtime as needed for mild pain (pain score 1-3) or moderate pain (pain score 4-6).     albuterol (VENTOLIN HFA) 108 (90 Base) MCG/ACT inhaler Inhale 2 puffs into the lungs every 6 (six) hours as needed for wheezing or shortness of breath. 6.7 g 2   atorvastatin (LIPITOR) 40 MG tablet Take 1 tablet (40 mg total) by mouth daily. (Patient taking differently: Take 40 mg by mouth at bedtime.) 90 tablet 3   carvedilol (COREG) 6.25 MG tablet Take 1 tablet (6.25 mg total) by mouth 2 (two) times daily with a meal. 180 tablet 3   fluticasone (FLONASE) 50 MCG/ACT nasal spray Place 1 spray into both nostrils daily. (Patient not taking: Reported on 06/05/2023) 16 g 2   loratadine (CLARITIN) 10 MG tablet Take 10 mg by mouth daily.     omeprazole (PRILOSEC) 20 MG capsule Take 2 capsules (40 mg total) by mouth daily. (Patient taking differently: Take 20 mg by mouth in the morning and at bedtime.) 180 capsule 3   rivaroxaban (XARELTO) 20 MG TABS tablet Take 1 tablet (20 mg total) by mouth daily with supper. 90 tablet 3   Tiotropium Bromide Monohydrate (SPIRIVA RESPIMAT) 2.5 MCG/ACT  AERS Inhale 2 puffs into the lungs daily. 4 g 3   No current facility-administered medications on file prior to visit.    Family History  Problem Relation Age of Onset   Hypertension Other    Heart disease Other    Diabetes Other    Coronary artery disease Other     Social History   Socioeconomic History   Marital status: Legally Separated    Spouse name: Not on file   Number of children: Not on file   Years of education: Not on file   Highest education level: Not on file  Occupational History   Not on file  Tobacco Use   Smoking status: Never   Smokeless tobacco: Never  Vaping Use   Vaping status: Never Used  Substance and Sexual Activity   Alcohol use: No   Drug use: Not on file   Sexual activity: Not Currently  Other Topics Concern   Not on file  Social History Narrative   Not on file   Social Determinants of Health   Financial Resource Strain: Medium Risk (07/02/2022)   Overall Financial Resource Strain (CARDIA)    Difficulty of Paying Living Expenses: Somewhat hard  Food Insecurity: No Food Insecurity (07/02/2022)   Hunger Vital Sign    Worried About Running Out of Food in the Last  Year: Never true    Ran Out of Food in the Last Year: Never true  Transportation Needs: No Transportation Needs (07/02/2022)   PRAPARE - Administrator, Civil Service (Medical): No    Lack of Transportation (Non-Medical): No  Physical Activity: Inactive (07/02/2022)   Exercise Vital Sign    Days of Exercise per Week: 0 days    Minutes of Exercise per Session: 0 min  Stress: No Stress Concern Present (07/02/2022)   [employer] of Occupational Health - Occupational Stress Questionnaire    Feeling of Stress : Only a little  Social Connections: Moderately Integrated (07/02/2022)   Social Connection and Isolation Panel [NHANES]    Frequency of Communication with Friends and Family: Never    Frequency of Social Gatherings with Friends and Family: Once a week     Attends Religious Services: More than 4 times per year    Active Member of [employer] or Organizations: Yes    Attends Engineer, structural: More than 4 times per year    Marital Status: Married  Catering manager Violence: Not At Risk (07/02/2022)   Humiliation, Afraid, Rape, and Kick questionnaire    Fear of Current or Ex-Partner: No    Emotionally Abused: No    Physically Abused: No    Sexually Abused: No    Review of Systems: ROS negative except for what is noted on the assessment and plan.  Objective:   Vitals:   07/16/23 0850  BP: (!) 144/72  Pulse: 79  Temp: 98 F (36.7 C)  TempSrc: Oral  SpO2: 95%  Weight: 217 lb 9.6 oz (98.7 kg)  Height: 5\' 3"  (1.6 m)    Physical Exam: Constitutional: well-appearing woman sitting in no acute distress HENT: normocephalic atraumatic, mucous membranes moist Eyes: conjunctiva non-erythematous Neck: supple Cardiovascular: regular rate and rhythm, no m/r/g Pulmonary/Chest: normal work of breathing on room air, lungs clear to auscultation bilaterally, no wheezes Abdominal: soft, non-tender, non-distended MSK: normal bulk and tone, no lower extremity edema Neurological: alert & oriented x 3 Skin: warm and dry Psych: Pleasant mood and affect       06/25/2023    1:56 PM  Depression screen PHQ 2/9  Decreased Interest 0  Down, Depressed, Hopeless 0  PHQ - 2 Score 0  Altered sleeping 1  Tired, decreased energy 1  Change in appetite 1  Feeling bad or failure about yourself  0  Trouble concentrating 0  Moving slowly or fidgety/restless 0  Suicidal thoughts 0  PHQ-9 Score 3  Difficult doing work/chores Not difficult at all       06/25/2023    1:57 PM  GAD 7 : Generalized Anxiety Score  Nervous, Anxious, on Edge 0  Control/stop worrying 0  Worry too much - different things 0  Trouble relaxing 1  Restless 0  Easily annoyed or irritable 0  Afraid - awful might happen 0  Total GAD 7 Score 1  Anxiety Difficulty Not  difficult at all     Assessment & Plan:   COPD (chronic obstructive pulmonary disease) (HCC) No increase in cough production, shortness of breath. Has not needed to use nebulized treatments. Adherent to therapy. -Continue spiriva  (HFpEF) heart failure with preserved ejection fraction (HCC) Asymptomatic. No evidence of volume overload today -No changes in current management  Essential hypertension Improved from prior; currently on amlodipine 5 mg daily and Coreg 6.25 mg daily. Asymptomatic -Increase amlodipine to 10 mg daily  Deep vein thrombosis (DVT) (HCC) Adherent  to xarelto therapy    Return in about 3 months (around 10/16/2023) for HTN, pap smear, COPD, obesity.  Patient discussed with Dr. Koren Bound, MD [facility] Medicine Residency Program  07/16/2023, 1:56 PM

## 2023-07-16 NOTE — Assessment & Plan Note (Signed)
>>  ASSESSMENT AND PLAN FOR DEEP VEIN THROMBOSIS (DVT) (HCC) WRITTEN ON 07/16/2023  1:56 PM BY GOMEZ-CARABALLO, MARIA, MD  Adherent to xarelto  therapy

## 2023-07-16 NOTE — Patient Instructions (Addendum)
Thank you, Ms.[name] for allowing [place] to provide your care today. Today we discussed   Your blood pressure - CONGRATULATIONS! Your blood pressure today is fantastic. Your diet and exercise- implement the modifications we spoke about today Your medications - call pharmacy for refills Your breathing status/COPD - continue your inhalers  Please call the sleep specialist to make an appointment: .[facility] in [place], [place] Address: [address], [place], [place] [address] Phone: [phone]   Please, also call the breast center to make sure you get scheduled for your mammogram: [phone]  Please follow-up in:3 months for follow up of hypertension AND your pap smear/ cervical exam    We look forward to seeing you next time. Please call our clinic at [phone] if you have any questions or concerns. The best time to call is Monday-Friday from 9am-4pm, but there is someone available 24/7. If after hours or the weekend, call the main hospital number and ask for the Internal Medicine Resident On-Call. If you need medication refills, please notify your pharmacy one week in advance and they will send [place] a request.   Thank you for letting us take part in your care. Wishing you the best!  Morene Crocker, MD 07/16/2023, 8:58 AM [facility]

## 2023-07-16 NOTE — Progress Notes (Signed)
Internal Medicine Clinic Attending  Case discussed with the resident at the time of the visit.  We reviewed the resident's history and exam and pertinent patient test results.  I agree with the assessment, diagnosis, and plan of care documented in the resident's note.  

## 2023-07-21 ENCOUNTER — Other Ambulatory Visit: Payer: Self-pay | Admitting: Internal Medicine

## 2023-07-21 ENCOUNTER — Other Ambulatory Visit (HOSPITAL_COMMUNITY): Payer: Self-pay

## 2023-07-21 DIAGNOSIS — K219 Gastro-esophageal reflux disease without esophagitis: Secondary | ICD-10-CM

## 2023-07-21 MED ORDER — OMEPRAZOLE 20 MG PO CPDR
40.0000 mg | DELAYED_RELEASE_CAPSULE | Freq: Every day | ORAL | 3 refills | Status: DC
  Filled 2023-07-21: qty 180, 90d supply, fill #0
  Filled 2023-11-04: qty 180, 90d supply, fill #1
  Filled 2024-02-09: qty 180, 90d supply, fill #2
  Filled 2024-05-27: qty 180, 90d supply, fill #3

## 2023-08-04 ENCOUNTER — Other Ambulatory Visit: Payer: Self-pay

## 2023-08-04 ENCOUNTER — Other Ambulatory Visit (HOSPITAL_COMMUNITY): Payer: Self-pay

## 2023-08-04 DIAGNOSIS — I10 Essential (primary) hypertension: Secondary | ICD-10-CM

## 2023-08-04 DIAGNOSIS — I5030 Unspecified diastolic (congestive) heart failure: Secondary | ICD-10-CM

## 2023-08-04 DIAGNOSIS — Z86711 Personal history of pulmonary embolism: Secondary | ICD-10-CM

## 2023-08-04 MED ORDER — RIVAROXABAN 20 MG PO TABS
20.0000 mg | ORAL_TABLET | Freq: Every day | ORAL | 3 refills | Status: AC
  Filled 2023-08-04: qty 90, 90d supply, fill #0
  Filled 2023-12-01: qty 90, 90d supply, fill #1

## 2023-08-04 MED ORDER — CARVEDILOL 6.25 MG PO TABS
6.2500 mg | ORAL_TABLET | Freq: Two times a day (BID) | ORAL | 3 refills | Status: AC
  Filled 2023-08-04 – 2023-08-11 (×2): qty 180, 90d supply, fill #0
  Filled 2023-12-01: qty 180, 90d supply, fill #1
  Filled 2024-03-11: qty 180, 90d supply, fill #2
  Filled 2024-06-17: qty 180, 90d supply, fill #3

## 2023-08-04 MED ORDER — ATORVASTATIN CALCIUM 40 MG PO TABS
40.0000 mg | ORAL_TABLET | Freq: Every day | ORAL | 3 refills | Status: DC
  Filled 2023-08-04 – 2023-12-01 (×2): qty 90, 90d supply, fill #0
  Filled 2024-04-22: qty 90, 90d supply, fill #1

## 2023-08-06 ENCOUNTER — Encounter: Payer: Self-pay | Admitting: Neurology

## 2023-08-06 ENCOUNTER — Ambulatory Visit (INDEPENDENT_AMBULATORY_CARE_PROVIDER_SITE_OTHER): Payer: Medicare PPO | Admitting: Neurology

## 2023-08-06 VITALS — BP 130/72 | HR 68 | Ht 63.0 in | Wt 215.0 lb

## 2023-08-06 DIAGNOSIS — G4733 Obstructive sleep apnea (adult) (pediatric): Secondary | ICD-10-CM | POA: Diagnosis not present

## 2023-08-06 DIAGNOSIS — G4719 Other hypersomnia: Secondary | ICD-10-CM

## 2023-08-06 DIAGNOSIS — R519 Headache, unspecified: Secondary | ICD-10-CM

## 2023-08-06 DIAGNOSIS — R351 Nocturia: Secondary | ICD-10-CM

## 2023-08-06 DIAGNOSIS — E669 Obesity, unspecified: Secondary | ICD-10-CM

## 2023-08-06 NOTE — Patient Instructions (Addendum)

## 2023-08-06 NOTE — Progress Notes (Signed)
Subjective:    Patient ID: [name] is a 67 y.o. female.  HPI    Huston Foley, MD, PhD [facility] [address] [address] [place], [place] [address]  Dear Drs. Masters and Mikey Bussing,  I saw your patient, [name], upon your kind request in my sleep clinic today for initial consultation of her sleep disorder, in particular, evaluation of her prior diagnosis of obstructive sleep apnea.  The patient is unaccompanied today.  As you know, [name] is a 67 year old female with an underlying complex medical history hospitalization for community-acquired pneumonia in October 2024, heart failure with preserved ejection fraction, acute kidney injury, history of bronchitis due to COVID, COPD, history of DVT, hypertension, hyperlipidemia, history of pulmonary embolism, on Xarelto, prediabetes, and obesity, who was previously diagnosed with obstructive sleep apnea and placed on PAP therapy.  I was able to review a brief report on a nocturnal polysomnogram from 06/23/2005.  Study was interpreted by Dr. Jetty Duhamel.  She had a split-night sleep study at that time with a baseline AHI of 24.9/h and O2 nadir of 78%.  CPAP of 12 cm was recommended at the time.  She reports that she no longer has a CPAP machine, she elected in [place].  She has not been on treatment for over 5 years.  She does recall benefiting from treatment in the past and would be willing to get reevaluated and consider treatment with a PAP machine again.  Her Epworth sleepiness score is 18 out of 24, fatigue score is 19 out of 63.  She lives with her daughter, daughter's family which includes husband and 4 children, as well as one of her daughters 2 children and boyfriend.  The patient also takes care of a great grandson during the day, he is 54 year old. No PAP compliance data is available for my review today.  Patient's bedtime is between 10 and midnight and rise time around 8:30 AM.  Her  great-grandson gets dropped off before 6 AM but she tries to go back to sleep.  Her weight has been more or less stable.  She drinks quite a bit of caffeine in the form of tea and soda, about 2-3 bottles of soda per day.  She is a non-smoker and does not currently consume any alcohol.  He is retired, she was a Designer, industrial/product.  He has occasional morning headaches and reports nocturia about twice per average night.  Not aware of any family history of sleep apnea.  They have no pets at the house.  She has a TV in her bedroom and it tends to be on at night but she puts it on a sleep timer. I reviewed your office note from 06/25/2023.  I also reviewed hospital records.  She was admitted on 06/04/2023 due to dyspnea, cough, congestion and chest pressure.  She was found to have right middle and upper lobe community-acquired pneumonia with reactive mediastinal lymph node enlargement.  She was treated with ceftriaxone and azithromycin and transition to Augmentin.  She needed supplemental oxygen during her hospital stay but was weaned to room air prior to discharge.  Her Past Medical History Is Significant For: Past Medical History:  Diagnosis Date   (HFpEF) heart failure with preserved ejection fraction (HCC)    Acute kidney injury (HCC)    Bronchitis due to COVID-19 virus 03/23/2019   DVT (deep venous thrombosis) (HCC)    Hyperlipidemia    Hypertension    Leg  pain    right   Obesity (BMI 30-39.9)    OSA (obstructive sleep apnea)    PE (pulmonary embolism)    Prediabetes    Sleep apnea    Trigger finger, left ring finger 01/04/2022    Her Past Surgical History Is Significant For: Past Surgical History:  Procedure Laterality Date   ABDOMINAL HYSTERECTOMY     APPENDECTOMY     BACK SURGERY     CHOLECYSTECTOMY     Gall Bladder removal   COLONOSCOPY WITH PROPOFOL N/A 05/06/2017   Procedure: COLONOSCOPY WITH PROPOFOL;  Surgeon: Charlott Rakes, MD;  Location: [facility];  Service: Endoscopy;  Laterality:  N/A;   LAMINOTOMY     right at L4-5 with a disc bulge and superimposed right lateral recess protrusion encroaching on the L5 root   SPINE SURGERY     decompression surgery    Her Family History Is Significant For: Family History  Problem Relation Age of Onset   Heart attack Mother    Heart attack Father    Heart attack Sister    Hypertension Other    Heart disease Other    Diabetes Other    Coronary artery disease Other     Her Social History Is Significant For: Social History   Socioeconomic History   Marital status: Legally Separated    Spouse name: Not on file   Number of children: 2   Years of education: Not on file   Highest education level: Not on file  Occupational History   Not on file  Tobacco Use   Smoking status: Never   Smokeless tobacco: Never  Vaping Use   Vaping status: Never Used  Substance and Sexual Activity   Alcohol use: No   Drug use: Never   Sexual activity: Not Currently  Other Topics Concern   Not on file  Social History Narrative   Caffiene tea 1 large cup   Working retired:  Production designer, 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 14, greatkids 15)   Social Drivers of Corporate investment banker Strain: Medium Risk (07/02/2022)   Overall Financial Resource Strain (CARDIA)    Difficulty of Paying Living Expenses: Somewhat hard  Food Insecurity: No Food Insecurity (07/02/2022)   Hunger Vital Sign    Worried About Running Out of Food in the Last Year: Never true    Ran Out of Food in the Last Year: Never true  Transportation Needs: No Transportation Needs (07/02/2022)   PRAPARE - Administrator, Civil Service (Medical): No    Lack of Transportation (Non-Medical): No  Physical Activity: Inactive (07/02/2022)   Exercise Vital Sign    Days of Exercise per Week: 0 days    Minutes of Exercise per Session: 0 min  Stress: No Stress Concern Present (07/02/2022)   [employer] of Occupational Health - Occupational Stress  Questionnaire    Feeling of Stress : Only a little  Social Connections: Moderately Integrated (07/02/2022)   Social Connection and Isolation Panel [NHANES]    Frequency of Communication with Friends and Family: Never    Frequency of Social Gatherings with Friends and Family: Once a week    Attends Religious Services: More than 4 times per year    Active Member of [employer] or Organizations: Yes    Attends Engineer, structural: More than 4 times per year    Marital Status: Married    Her Allergies Are:  Allergies  Allergen Reactions   Tramadol Nausea And  Vomiting   Ciprofloxacin Rash  :   Her Current Medications Are:  Outpatient Encounter Medications as of 08/06/2023  Medication Sig   acetaminophen (TYLENOL) 325 MG tablet Take 325 mg by mouth at bedtime as needed for mild pain (pain score 1-3) or moderate pain (pain score 4-6).   albuterol (VENTOLIN HFA) 108 (90 Base) MCG/ACT inhaler Inhale 2 puffs into the lungs every 6 (six) hours as needed for wheezing or shortness of breath.   amLODipine (NORVASC) 10 MG tablet Take 1 tablet (10 mg total) by mouth daily.   atorvastatin (LIPITOR) 40 MG tablet Take 1 tablet (40 mg total) by mouth daily.   carvedilol (COREG) 6.25 MG tablet Take 1 tablet (6.25 mg total) by mouth 2 (two) times daily with a meal.   fluticasone (FLONASE) 50 MCG/ACT nasal spray Place 1 spray into both nostrils daily. (Patient not taking: Reported on 06/05/2023)   loratadine (CLARITIN) 10 MG tablet Take 10 mg by mouth daily.   omeprazole (PRILOSEC) 20 MG capsule Take 2 capsules (40 mg total) by mouth daily.   rivaroxaban (XARELTO) 20 MG TABS tablet Take 1 tablet (20 mg total) by mouth daily with supper.   Tiotropium Bromide Monohydrate (SPIRIVA RESPIMAT) 2.5 MCG/ACT AERS Inhale 2 puffs into the lungs daily.   No facility-administered encounter medications on file as of 08/06/2023.  :   Review of Systems:  Out of a complete 14 point review of systems, all are  reviewed and negative with the exception of these symptoms as listed below:  Review of Systems  Neurological:        OSA Evaluation.  Sleep study done years ago. > 81yrs ago in [place] not sure where.  ESS 18 FSS19. Snoring, daytime fatigue.     Objective:  Neurological Exam  Physical Exam Physical Examination:   Vitals:   08/06/23 1022  BP: (!) 152/76  Pulse: 74    General Examination: The patient is a very pleasant 67 y.o. female in no acute distress. She appears well-developed and well-nourished and well groomed.   HEENT: Normocephalic, atraumatic, pupils are equal, round and reactive to light, extraocular tracking is good without limitation to gaze excursion or nystagmus noted. Hearing is grossly intact. Face is symmetric with normal facial animation. Speech is clear with no dysarthria noted. There is no hypophonia. There is no lip, neck/head, jaw or voice tremor. Neck is supple with full range of passive and active motion. There are no carotid bruits on auscultation. Oropharynx exam reveals: moderate mouth dryness, adequate dental hygiene and moderate airway crowding, due to small airway entry and redundant soft palate.  Mallampati class III.  No significant overbite, slight crossbite noted.  Neck circumference 16-1/2 inches.  Tongue protrudes centrally and palate elevates symmetrically.  Chest: Clear to auscultation without wheezing, rhonchi or crackles noted.  Heart: S1+S2+0, regular and normal without murmurs, rubs or gallops noted.   Abdomen: Soft, non-tender and non-distended.  Extremities: There is nonpitting puffiness noted in the distal lower extremities bilaterally.   Skin: Warm and dry without trophic changes noted.   Musculoskeletal: exam reveals no obvious joint deformities.   Neurologically:  Mental status: The patient is awake, alert and oriented in all 4 spheres. Her immediate and remote memory, attention, language skills and fund of knowledge are appropriate. There  is no evidence of aphasia, agnosia, apraxia or anomia. Speech is clear with normal prosody and enunciation. Thought process is linear. Mood is normal and affect is normal.  Cranial nerves II - XII  are as described above under HEENT exam.  Motor exam: Normal bulk, strength and tone is noted. There is no obvious action or resting tremor.  Fine motor skills and coordination: grossly intact.  Cerebellar testing: No dysmetria or intention tremor. There is no truncal or gait ataxia.  Sensory exam: intact to light touch in the upper and lower extremities.  Gait, station and balance: She stands easily. No veering to one side is noted. No leaning to one side is noted. Posture is age-appropriate and stance is narrow based. Gait shows normal stride length and normal pace. No problems turning are noted.   Assessment and Plan:  In summary, [name] is a very pleasant 67 y.o.-year old female with an underlying complex medical history hospitalization for community-acquired pneumonia in October 2024, heart failure with preserved ejection fraction, acute kidney injury, history of bronchitis due to COVID, COPD, history of DVT, hypertension, hyperlipidemia, history of pulmonary embolism, on Xarelto, prediabetes, and obesity, who presents for evaluation of her obstructive sleep apnea.  She was diagnosed with moderate obstructive sleep apnea with a split-night sleep study several years ago, over 18 years ago.  She is no longer on PAP therapy but had a machine in the past.  She would be willing to get reevaluated and consider PAP therapy again. A laboratory attended sleep study is typically considered "gold standard" for evaluation of sleep disordered breathing.   I had a long chat with the patient about my findings and the diagnosis of sleep apnea, particularly OSA, its prognosis and treatment options. We talked about medical/conservative treatments, surgical interventions and non-pharmacological approaches for  symptom control. I explained, in particular, the risks and ramifications of untreated moderate to severe OSA, especially with respect to developing cardiovascular disease down the road, including congestive heart failure (CHF), difficult to treat hypertension, cardiac arrhythmias (particularly A-fib), neurovascular complications including TIA, stroke and dementia. Even type 2 diabetes has, in part, been linked to untreated OSA. Symptoms of untreated OSA may include (but may not be limited to) daytime sleepiness, nocturia (i.e. frequent nighttime urination), memory problems, mood irritability and suboptimally controlled or worsening mood disorder such as depression and/or anxiety, lack of energy, lack of motivation, physical discomfort, as well as recurrent headaches, especially morning or nocturnal headaches. We talked about the importance of maintaining a healthy lifestyle and striving for healthy weight. In addition, we talked about the importance of striving for and maintaining good sleep hygiene. I recommended a sleep study at this time. I outlined the differences between a laboratory attended sleep study which is considered more comprehensive and accurate over the option of a home sleep test (HST); the latter may lead to underestimation of sleep disordered breathing in some instances and does not help with diagnosing upper airway resistance syndrome and is not accurate enough to diagnose primary central sleep apnea typically. I outlined possible surgical and non-surgical treatment options of OSA, including the use of a positive airway pressure (PAP) device (i.e. CPAP, AutoPAP/APAP or BiPAP in certain circumstances), a custom-made dental device (aka oral appliance, which would require a referral to a specialist dentist or orthodontist typically, and is generally speaking not considered for patients with full dentures or edentulous state), upper airway surgical options, such as traditional UPPP (which is not  considered a first-line treatment) or the Inspire device (hypoglossal nerve stimulator, which would involve a referral for consultation with an ENT surgeon, after careful selection, following inclusion criteria - also not first-line treatment). I explained the PAP treatment option  to the patient in detail, as this is generally considered first-line treatment.  The patient indicated that she would be willing to try PAP therapy again, if the need arises. I explained the importance of being compliant with PAP treatment, not only for insurance purposes but primarily to improve patient's symptoms symptoms, and for the patient's long term health benefit, including to reduce Her cardiovascular risks longer-term.    We will pick up our discussion about the next steps and treatment options after testing.  We will keep her posted as to the test results by phone call and/or MyChart messaging where possible.  We will plan to follow-up in sleep clinic accordingly as well.  She is strongly reminded not to drive when feeling sleepy definitely not do any long distance driving at this time. I answered all her questions today and the patient was in agreement.   I encouraged her to call with any interim questions, concerns, problems or updates or email [place] through MyChart.  Generally speaking, sleep test authorizations may take up to 2 weeks, sometimes less, sometimes longer, the patient is encouraged to get in touch with [place] if they do not hear back from the sleep lab staff directly within the next 2 weeks.  Thank you very much for allowing me to participate in the care of this nice patient. If I can be of any further assistance to you please do not hesitate to call me at [phone].  Sincerely,   Huston Foley, MD, PhD

## 2023-08-11 ENCOUNTER — Other Ambulatory Visit (HOSPITAL_COMMUNITY): Payer: Self-pay

## 2023-08-11 ENCOUNTER — Other Ambulatory Visit: Payer: Self-pay

## 2023-08-25 ENCOUNTER — Ambulatory Visit
Admission: RE | Admit: 2023-08-25 | Discharge: 2023-08-25 | Disposition: A | Discharge status: Home / Self Care | Payer: Medicare PPO | Source: Ambulatory Visit | Attending: Internal Medicine | Admitting: Internal Medicine

## 2023-08-25 DIAGNOSIS — Z1231 Encounter for screening mammogram for malignant neoplasm of breast: Secondary | ICD-10-CM

## 2023-08-28 ENCOUNTER — Telehealth: Payer: Self-pay | Admitting: Neurology

## 2023-08-28 NOTE — Telephone Encounter (Signed)
 [employer]: [id number] (exp. 08/27/23 to 01/14/24)   Patient is scheduled at [facility] for 09/02/23 at 9 pm.  I spoke with the patient to schedule the appt and went over some information with her before her appt.

## 2023-09-02 ENCOUNTER — Ambulatory Visit (INDEPENDENT_AMBULATORY_CARE_PROVIDER_SITE_OTHER): Payer: Medicare PPO | Admitting: Neurology

## 2023-09-02 DIAGNOSIS — R519 Headache, unspecified: Secondary | ICD-10-CM

## 2023-09-02 DIAGNOSIS — G4719 Other hypersomnia: Secondary | ICD-10-CM

## 2023-09-02 DIAGNOSIS — E669 Obesity, unspecified: Secondary | ICD-10-CM

## 2023-09-02 DIAGNOSIS — G4733 Obstructive sleep apnea (adult) (pediatric): Secondary | ICD-10-CM

## 2023-09-02 DIAGNOSIS — R351 Nocturia: Secondary | ICD-10-CM

## 2023-09-02 DIAGNOSIS — G4734 Idiopathic sleep related nonobstructive alveolar hypoventilation: Secondary | ICD-10-CM

## 2023-09-02 DIAGNOSIS — G4720 Circadian rhythm sleep disorder, unspecified type: Secondary | ICD-10-CM

## 2023-09-08 NOTE — Procedures (Signed)
 Physician Interpretation:     [facility] POLYSOMNOGRAPHY  INTERPRETATION REPORT   STUDY DATE:  09/02/2023     PATIENT NAME:  [name]         DATE OF BIRTH:  08-03-1956  PATIENT ID:  [id number]    TYPE OF STUDY:  PSG  READING PHYSICIAN: True Mar, MD, PhD   SCORING TECHNICIAN: Donnice Counts, RPSGT  Referred by: Masters, Izetta, DO  ? History and Indication for Testing: 68 year old female with an underlying complex medical history of community-acquired pneumonia in October 2024, heart failure with preserved ejection fraction, acute kidney injury, history of bronchitis due to COVID, COPD, history of DVT, hypertension, hyperlipidemia, history of pulmonary embolism, on Xarelto , prediabetes, and obesity, who was previously diagnosed with obstructive sleep apnea and placed on PAP therapy. She no longer has a PAP machine.  Height: 63 in Weight: 215 lb (BMI 38) Neck Size: 17 in    MEDICATIONS: Tylenol , Ventolin  HFA, Norvasc , Lipitor, Coreg , Flonase , Claritin , Prilosec, Xarelto , Spiriva  Respimate   TECHNICAL DESCRIPTION: A registered sleep technologist was in attendance for the duration of the recording.  Data collection, scoring, video monitoring, and reporting were performed in compliance with the AASM Manual for the Scoring of Sleep and Associated Events; (Hypopnea is scored based on the criteria listed in Section VIII D. 1b in the AASM Manual V2.6 using a 4% oxygen desaturation rule or Hypopnea is scored based on the criteria listed in Section VIII D. 1a in the AASM Manual V2.6 using 3% oxygen desaturation and /or arousal rule).  SLEEP CONTINUITY AND SLEEP ARCHITECTURE:  Lights-out was at 22:05: and lights-on at  04:54:, with a total recording time of 6 hours, 49.5 min. Total sleep time ( TST) was 356.0 minutes with a normal sleep efficiency at 86.9%. There was  21.5% REM sleep.    BODY POSITION:  TST was divided  between the following sleep positions:  14.6% supine;  85.4% lateral;  0% prone. Duration of total sleep and percent of total sleep in their respective position is as follows: supine 52 minutes (15%), non-supine 304 minutes (85%); right 148 minutes (42%), left 155 minutes (44%), and prone 00 minutes (0%).  Total supine REM sleep time was 02 minutes (3% of total REM sleep).  Sleep latency was normal at 23.0 minutes.  REM sleep latency was mildly increased at 111.0 minutes. Of the total sleep time, the percentage of stage N1 sleep was 5.9%, stage N2 sleep was 68%, which is increased, stage N3 sleep was 4.5%, and REM sleep was 21.5%, which is normal.  Wake after sleep onset (WASO) time accounted for 30.5 minutes with intermittent minimal to mild sleep fragmentation noted.   RESPIRATORY MONITORING:   Based on CMS criteria (using a 4% oxygen desaturation rule for scoring hypopneas), there were 3 apneas (2 obstructive; 0 central; 1 mixed), and 35 hypopneas.  Apnea index was 0.5. Hypopnea index was 5.9. The apnea-hypopnea index was 6.4/hour overall (4.6 supine, 22 non-supine; 22.7 REM, 60.0 supine REM).  There were 0 respiratory effort-related arousals (RERAs).  The RERA index was 0 events/h. Total respiratory disturbance index (RDI) was 6.4 events/h. RDI results showed: supine RDI  4.6 /h; non-supine RDI 6.7 /h; REM RDI 22.7 /h, supine REM RDI 60.0 /h.   Based on AASM criteria (using a 3% oxygen desaturation and /or arousal rule for scoring hypopneas), there were 3 apneas (2 obstructive; 0 central; 1 mixed), and 38 hypopneas. Apnea index was 0.5. Hypopnea index was  6.4. The apnea-hypopnea index was 6.9 overall (5.8 supine, 23 non-supine; 23.5 REM, 60.0 supine REM).  There were 0 respiratory effort-related arousals (RERAs).  The RERA index was 0 events/h. Total respiratory disturbance index (RDI) was 6.9 events/h. RDI results showed: supine RDI  5.8 /h; non-supine RDI 7.1 /h; REM RDI 23.5 /h, supine REM RDI 60.0 /h.   OXIMETRY: Oxyhemoglobin  Saturation Nadir during sleep was at  74% from a mean of 90%.  Of the Total sleep time (TST)   hypoxemia (=<88%) was present for  63.9 minutes, or 18.0% of total sleep time.   LIMB MOVEMENTS: There were 0 periodic limb movements of sleep (0.0/hr), of which 0 (0.0/hr) were associated with an arousal.   AROUSAL: There were 39 arousals in total, for an arousal index of 7 arousals/hour.  Of these, 10 were identified as respiratory-related arousals (2 /h), 0 were PLM-related arousals (0 /h), and 32 were non-specific arousals (5 /h).   EEG: Review of the EEG showed no abnormal electrical discharges and symmetrical bihemispheric findings.    EKG: The EKG revealed normal sinus rhythm (NSR). The average heart rate during sleep was 72 bpm.   AUDIO/VIDEO REVIEW: The audio and video review did not show any abnormal or unusual behaviors, movements, phonations or vocalizations. The patient took no restroom breaks. Snoring was noted, and was intermittent, in the mild range.  POST-STUDY QUESTIONNAIRE: Post study, the patient indicated, that sleep was the same as usual.   IMPRESSION:  1. Obstructive Sleep Apnea (OSA) 2. Nocturnal Hypoxemia 3. Dysfunctions associated with sleep stages or arousal from sleep  RECOMMENDATIONS:  1. This study demonstrates overall mild obstructive sleep apnea, much more pronounced during REM sleep with significant desaturations noted during REM sleep, with a total AHI of 6.4/hour, REM AHI of 22.7/hour, supine AHI of 4.6/hour and O2 nadir of 74% and evidence of nocturnal hypoxemia. Given the patient's medical history and sleep related complaints, treatment with positive airway pressure is recommended; this can be achieved in the form of autoPAP. A full-night CPAP titration study would allow optimization of therapy if needed. Other treatment options may include avoidance of supine sleep position along with weight loss, or the use of an oral appliance in selected patients. Please note,  that untreated obstructive sleep apnea may carry additional perioperative morbidity. Patients with significant obstructive sleep apnea should receive perioperative PAP therapy and the surgeons and particularly the anesthesiologist should be informed of the diagnosis and the severity of the sleep disordered breathing. 2. This study shows some sleep fragmentation and mildly abnormal sleep stage percentages; these are nonspecific findings and per se do not signify an intrinsic sleep disorder or a cause for the patient's sleep-related symptoms. Causes include (but are not limited to) the first night effect of the sleep study, circadian rhythm disturbances, medication effect or an underlying mood disorder or medical problem.  3. The patient should be cautioned not to drive, work at heights, or operate dangerous or heavy equipment when tired or sleepy. Review and reiteration of good sleep hygiene measures should be pursued with any patient. 4. The patient will be seen in follow-up by Dr. Buck at [facility] for discussion of the test results and further management strategies. The referring provider will be notified of the test results.   I certify that I have reviewed the entire raw data recording prior to the issuance of this report in accordance with the Standards of Accreditation of the American Academy of Sleep Medicine (AASM).  True Buck, MD,  PhD Medical Director, [facility]) Diplomat, ABPN (Neurology and Sleep)               Technical Report:   General Information  Name: Raley, Novicki BMI: 61.90 Physician: True Mar, MD  ID: [id number] Height: 63.0 in Technician: Donnice Counts, RPSGT  Sex: Female Weight: 215.0 lb Record: [id number]  Age: 36 [June 08, 1956] Date: 09/02/2023    Medical & Medication History    Ms. Lycan is a 68 year old female with an underlying complex medical history hospitalization for community-acquired pneumonia in  October 2024, heart failure with preserved ejection fraction, acute kidney injury, history of bronchitis due to COVID, COPD, history of DVT, hypertension, hyperlipidemia, history of pulmonary embolism, on Xarelto , prediabetes, and obesity, who was previously diagnosed with obstructive sleep apnea and placed on PAP therapy. I was able to review a brief report on a nocturnal polysomnogram from 06/23/2005. Study was interpreted by Dr. Reggy Salt. She had a split-night sleep study at that time with a baseline AHI of 24.9/h and O2 nadir of 78%. CPAP of 12 cm was recommended at the time. She reports that she no longer has a CPAP machine, she elected in [place] . She has not been on treatment for over 5 years. She does recall benefiting from treatment in the past and would be willing to get reevaluated and consider treatment with a PAP machine again. Her Epworth sleepiness score is 18 out of 24, fatigue score is 19 out of 63. She lives with her daughter, daughter's family which includes husband and 4 children, as well as one of her daughters 2 children and boyfriend. The patient also takes care of a great grandson during the day, he is 6 year old. No PAP compliance data is available for my review today.  Tylenol , Ventolin  HFA, Norvasc , Lipitor, Coreg , Flonase , Claritin , Prilosec, Xarelto , Spiriva  Respimate   Sleep Disorder      Comments   Patient arrived for a diagnostic polysomnogram. Procedure explained and all questions answered. paste setup without complications. Patient slept supine, left, and right. Mild snoring was noted. Respiratory events observed primarily in REM sleep . Cardiac arrhythmias noted. Patient has a known cardiac history. No significant PLMS observed. No restroom visits.    Lights out: 10:05:18 PM Lights on: 04:54:46 AM   Time Total Supine Side Prone Upright  Recording (TRT) 6h 49.31m 0h 55.67m 5h 54.9m 0h 0.32m 0h 0.39m  Sleep (TST) 5h 56.9m 0h 52.19m 5h 4.12m 0h 0.81m 0h 0.12m   Latency  N1 N2 N3 REM Onset Per. Slp. Eff.  Actual 0h 0.84m 0h 4.6m 0h 59.18m 1h 51.36m 0h 23.23m 0h 43.77m 86.94%   Stg Dur Wake N1 N2 N3 REM  Total 53.5 21.0 242.5 16.0 76.5  Supine 3.5 2.0 48.0 0.0 2.0  Side 50.0 19.0 194.5 16.0 74.5  Prone 0.0 0.0 0.0 0.0 0.0  Upright 0.0 0.0 0.0 0.0 0.0   Stg % Wake N1 N2 N3 REM  Total 13.1 5.9 68.1 4.5 21.5  Supine 0.9 0.6 13.5 0.0 0.6  Side 12.2 5.3 54.6 4.5 20.9  Prone 0.0 0.0 0.0 0.0 0.0  Upright 0.0 0.0 0.0 0.0 0.0     Apnea Summary Sub Supine Side Prone Upright  Total 3 Total 3 0 3 0 0    REM 2 0 2 0 0    NREM 1 0 1 0 0  Obs 2 REM 1 0 1 0 0    NREM 1 0 1 0 0  Mix 1 REM 1 0 1 0 0    NREM 0 0 0 0 0  Cen 0 REM 0 0 0 0 0    NREM 0 0 0 0 0   Rera Summary Sub Supine Side Prone Upright  Total 0 Total 0 0 0 0 0    REM 0 0 0 0 0    NREM 0 0 0 0 0   Hypopnea Summary Sub Supine Side Prone Upright  Total 38 Total 38 5 33 0 0    REM 28 2 26  0 0    NREM 10 3 7  0 0   4% Hypopnea Summary Sub Supine Side Prone Upright  Total (4%) 35 Total 35 4 31 0 0    REM 27 2 25  0 0    NREM 8 2 6  0 0     AHI Total Obs Mix Cen  6.91 Apnea 0.51 0.34 0.17 0.00   Hypopnea 6.40 -- -- --  6.40 Hypopnea (4%) 5.90 -- -- --    Total Supine Side Prone Upright  Position AHI 6.91 5.77 7.11 0.00 0.00  REM AHI 23.53   NREM AHI 2.36   Position RDI 6.91 5.77 7.11 0.00 0.00  REM RDI 23.53   NREM RDI 2.36    4% Hypopnea Total Supine Side Prone Upright  Position AHI (4%) 6.40 4.62 6.71 0.00 0.00  REM AHI (4%) 22.75   NREM AHI (4%) 1.93   Position RDI (4%) 6.40 4.62 6.71 0.00 0.00  REM RDI (4%) 22.75   NREM RDI (4%) 1.93    Desaturation Information Threshold: 2% <100% <90% <80% <70% <60% <50% <40%  Supine 23.0 5.0 0.0 0.0 0.0 0.0 0.0  Side 102.0 60.0 7.0 0.0 0.0 0.0 0.0  Prone 0.0 0.0 0.0 0.0 0.0 0.0 0.0  Upright 0.0 0.0 0.0 0.0 0.0 0.0 0.0  Total 125.0 65.0 7.0 0.0 0.0 0.0 0.0  Index 19.4 10.1 1.1 0.0 0.0 0.0 0.0   Threshold: 3% <100% <90% <80% <70% <60% <50%  <40%  Supine 9.0 5.0 0.0 0.0 0.0 0.0 0.0  Side 52.0 41.0 7.0 0.0 0.0 0.0 0.0  Prone 0.0 0.0 0.0 0.0 0.0 0.0 0.0  Upright 0.0 0.0 0.0 0.0 0.0 0.0 0.0  Total 61.0 46.0 7.0 0.0 0.0 0.0 0.0  Index 9.5 7.2 1.1 0.0 0.0 0.0 0.0   Threshold: 4% <100% <90% <80% <70% <60% <50% <40%  Supine 6.0 5.0 0.0 0.0 0.0 0.0 0.0  Side 43.0 38.0 7.0 0.0 0.0 0.0 0.0  Prone 0.0 0.0 0.0 0.0 0.0 0.0 0.0  Upright 0.0 0.0 0.0 0.0 0.0 0.0 0.0  Total 49.0 43.0 7.0 0.0 0.0 0.0 0.0  Index 7.6 6.7 1.1 0.0 0.0 0.0 0.0   Threshold: 3% <100% <90% <80% <70% <60% <50% <40%  Supine 9 5 0 0 0 0 0  Side 52 41 7 0 0 0 0  Prone 0 0 0 0 0 0 0  Upright 0 0 0 0 0 0 0  Total 61 46 7 0 0 0 0   Awakening/Arousal Information # of Awakenings 13  Wake after sleep onset 30.25m  Wake after persistent sleep 26.79m   Arousal Assoc. Arousals Index  Apneas 1 0.2  Hypopneas 9 1.5  Leg Movements 8 1.3  Snore 0 0.0  PTT Arousals 0 0.0  Spontaneous 33 5.6  Total 51 8.6  Leg Movement Information PLMS LMs Index  Total LMs during PLMS 0 0.0  LMs w/ Microarousals 0  0.0   LM LMs Index  w/ Microarousal 8 1.3  w/ Awakening 3 0.5  w/ Resp Event 0 0.0  Spontaneous 9 1.5  Total 17 2.9     Desaturation threshold setting: 3% Minimum desaturation setting: 10 seconds SaO2 nadir: 74% The longest event was a 94 sec obstructive Hypopnea with a minimum SaO2 of 77%. The lowest SaO2 was 76% associated with a 24 sec obstructive Apnea. EKG Rates EKG Avg Max Min  Awake 77 94 64  Asleep 72 86 57  EKG Events: Tachycardia

## 2023-09-08 NOTE — Addendum Note (Signed)
Addended by: Huston Foley on: 09/08/2023 06:21 PM   Modules accepted: Orders

## 2023-09-09 ENCOUNTER — Telehealth: Payer: Self-pay | Admitting: *Deleted

## 2023-09-09 NOTE — Telephone Encounter (Signed)
Informed pt of their sleep study results. The study showed mild OSA overall but worse in REM sleep. Per Dr. Teofilo Pod recommendations, the pt was advised to start autopap therapy at home. She has verbalized understanding and agreement to proceed with autopap. Her questions were answered. We discussed the insurance compliance requirements which includes using the machine at least 4 hours at night and also being seen by our office between 30 and 90 days after setup. The pt scheduled initial f/u for 12/02/23 at 3:45 pm. Discussed DME, will refer to [facility]. Pt verbalized appreciation for the call.   Order sent to [facility]. Sleep study sent to referring provider.

## 2023-09-09 NOTE — Telephone Encounter (Signed)
-----   Message from Huston Foley sent at 09/08/2023  6:21 PM EST ----- Patient referred by PCP for re-eval of her OSA, seen by me on 08/06/23, diagnostic PSG on 09/02/23.    Please call and notify the patient that the recent sleep study did confirm the diagnosis of obstructive sleep apnea. OSA is overall mild, but much more pronounced in REM/Dream sleep with significant oxygen drop during REM sleep. OSA is definitely worth treating to see if she feels better after treatment. To that end I recommend treatment for this in the form of autoPAP, which means, that we don't have to bring her back for a second sleep study with CPAP, but will let him try an autoPAP machine at home, through a DME company (of her choice, or as per insurance requirement). The DME representative will educate her on how to use the machine, how to put the mask on, etc. I have placed an order in the chart. Please send referral, talk to patient, send report to referring MD. We will need a FU in sleep clinic for 10 weeks post-PAP set up, please arrange that with me or one of our NPs. Thanks,   Huston Foley, MD, PhD [facility])

## 2023-09-10 NOTE — Telephone Encounter (Signed)
 Zott, Linnell Fulling, Otilio Jefferson, RN; [place], [place] Got It Thank  You

## 2023-09-17 ENCOUNTER — Ambulatory Visit: Payer: Medicare PPO | Admitting: Student

## 2023-09-17 ENCOUNTER — Other Ambulatory Visit (HOSPITAL_COMMUNITY): Payer: Self-pay

## 2023-09-17 ENCOUNTER — Telehealth: Payer: Self-pay

## 2023-09-17 VITALS — BP 143/72 | HR 84 | Temp 101.1°F

## 2023-09-17 DIAGNOSIS — B349 Viral infection, unspecified: Secondary | ICD-10-CM | POA: Diagnosis not present

## 2023-09-17 LAB — RESP PANEL BY RT-PCR (RSV, FLU A&B, COVID)  RVPGX2
Influenza A by PCR: NEGATIVE
Influenza B by PCR: NEGATIVE
Resp Syncytial Virus by PCR: NEGATIVE
SARS Coronavirus 2 by RT PCR: NEGATIVE

## 2023-09-17 MED ORDER — ONDANSETRON 4 MG PO TBDP
4.0000 mg | ORAL_TABLET | Freq: Three times a day (TID) | ORAL | 0 refills | Status: DC | PRN
  Filled 2023-09-17: qty 20, 7d supply, fill #0

## 2023-09-17 NOTE — Progress Notes (Unsigned)
Subjective:  CC: Fevers, nausea vomiting.  HPI:  [name] is a 68 y.o. person with a past medical history stated below and presents today for the stated chief complaint. Please see problem based assessment and plan for additional details.  Past Medical History:  Diagnosis Date   (HFpEF) heart failure with preserved ejection fraction (HCC)    Acute kidney injury (HCC)    Bronchitis due to COVID-19 virus 03/23/2019   DVT (deep venous thrombosis) (HCC)    Hyperlipidemia    Hypertension    Leg pain    right   Obesity (BMI 30-39.9)    OSA (obstructive sleep apnea)    PE (pulmonary embolism)    Prediabetes    Sleep apnea    Trigger finger, left ring finger 01/04/2022    Current Outpatient Medications on File Prior to Visit  Medication Sig Dispense Refill   acetaminophen (TYLENOL) 325 MG tablet Take 325 mg by mouth at bedtime as needed for mild pain (pain score 1-3) or moderate pain (pain score 4-6).     albuterol (VENTOLIN HFA) 108 (90 Base) MCG/ACT inhaler Inhale 2 puffs into the lungs every 6 (six) hours as needed for wheezing or shortness of breath. 6.7 g 2   amLODipine (NORVASC) 10 MG tablet Take 1 tablet (10 mg total) by mouth daily. 30 tablet 11   atorvastatin (LIPITOR) 40 MG tablet Take 1 tablet (40 mg total) by mouth daily. 90 tablet 3   carvedilol (COREG) 6.25 MG tablet Take 1 tablet (6.25 mg total) by mouth 2 (two) times daily with a meal. 180 tablet 3   fluticasone (FLONASE) 50 MCG/ACT nasal spray Place 1 spray into both nostrils daily. (Patient not taking: Reported on 06/05/2023) 16 g 2   loratadine (CLARITIN) 10 MG tablet Take 10 mg by mouth daily.     omeprazole (PRILOSEC) 20 MG capsule Take 2 capsules (40 mg total) by mouth daily. 180 capsule 3   rivaroxaban (XARELTO) 20 MG TABS tablet Take 1 tablet (20 mg total) by mouth daily with supper. 90 tablet 3   Tiotropium Bromide Monohydrate (SPIRIVA RESPIMAT) 2.5 MCG/ACT AERS Inhale 2 puffs into the lungs  daily. 4 g 3   No current facility-administered medications on file prior to visit.    Review of Systems: Please see assessment and plan for pertinent positives and negatives.  Objective:   Vitals:   09/17/23 1459  BP: (!) 143/72  Pulse: 84  Temp: (!) 101.1 F (38.4 C)  TempSrc: Oral  SpO2: 96%    Physical Exam: Constitutional: Tired-appearing, nonseptic nontoxic. Cardiovascular: Regular rate and rhythm Pulmonary/Chest: lungs clear to auscultation bilaterally Abdominal: soft, non-tender, non-distended Extremities: 1+ pedal edema bilaterally  Psych: Pleasant affect Thought process is linear and is goal-directed.  Capillary refill and skin turgor normal   Assessment & Plan:  Acute viral syndrome This is a 68 year old female with COPD, HFpEF, history of DVT, and history of pneumonia presenting with acute onset fevers, chills, nausea, vomiting, diarrhea, cough, congestion, and sinus pressure.  She states the symptoms began around 4 AM this morning.  She has been able to hold down some Coke and crackers, but otherwise she has no appetite.  She states she has thrown up about 4 times today.  On exam, she is maintaining her blood pressures well at 143/72.  Her oxygen saturation is 96%.  Lungs are clear to auscultation, no wheezes or crackles appreciated.  She does have lower extremity edema 1+ to the ankles.  No abdominal pain.  Plan: Constellation of signs and symptoms favored to reflect acute viral illness COVID flu and RSV all negative today. No role for antiviral medications at this time. Zofran given to help with symptoms. Return precautions discussed in case of worsening symptoms, respiratory failure, heart failure exacerbation, etc.    Patient discussed with Dr. Willow Ora MD [facility]  PGY-1 Pager: [phone]  Phone: [phone] Date 09/18/2023  Time 9:56 AM

## 2023-09-17 NOTE — Telephone Encounter (Signed)
Thank you - will see patient this afternoon.

## 2023-09-17 NOTE — Telephone Encounter (Signed)
Pt called requesting  an appt .Marland Kitchen When asked what was going on pt stated that she has chills , vomiting, diarrhea  starting this morning 1/29.Marland KitchenMarland Kitchen

## 2023-09-17 NOTE — Patient Instructions (Signed)
Thank you, Ms.[name] for allowing [place] to provide your care today.  I have ordered the following tests for you:   Lab Orders         Resp panel by RT-PCR (RSV, Flu A&B, Covid) Anterior Nasal Swab       Referrals ordered today:   Referral Orders  No referral(s) requested today     I have ordered the following medication/changed the following medications:   Stop the following medications: There are no discontinued medications.   Start the following medications: Meds ordered this encounter  Medications   ondansetron (ZOFRAN-ODT) 4 MG disintegrating tablet    Sig: Dissolve 1 tablet (4 mg total) by mouth every 8 (eight) hours as needed for nausea or vomiting.    Dispense:  20 tablet    Refill:  0      Follow up:  as needed  for worsening symptoms    We look forward to seeing you next time. Please call our clinic at [phone] if you have any questions or concerns. The best time to call is Monday-Friday from 9am-4pm, but there is someone available 24/7. If after hours or the weekend, call the main hospital number and ask for the Internal Medicine Resident On-Call. If you need medication refills, please notify your pharmacy one week in advance and they will send [place] a request.   Thank you for trusting me with your care. Wishing you the best!  Lovie Macadamia MD [facility]

## 2023-09-17 NOTE — Telephone Encounter (Signed)
Called pt back who stated chills, diarrhea, vomiting started early this morning. She tried drinking some coke. Stated she does not have any med to take but she has someone can go to the pharmacy to get her Lomotil/Tylenol since her appt is this afternoon. Also informed pt try crackers. Stated her grandchild was not feeling well ;thought it was d/t teething. Appt today 1515PM with Dr Rosaura Carpenter.

## 2023-09-18 ENCOUNTER — Telehealth: Payer: Self-pay

## 2023-09-18 DIAGNOSIS — B349 Viral infection, unspecified: Secondary | ICD-10-CM | POA: Insufficient documentation

## 2023-09-18 HISTORY — DX: Viral infection, unspecified: B34.9

## 2023-09-18 NOTE — Assessment & Plan Note (Signed)
This is a 68 year old female with COPD, HFpEF, history of DVT, and history of pneumonia presenting with acute onset fevers, chills, nausea, vomiting, diarrhea, cough, congestion, and sinus pressure.  She states the symptoms began around 4 AM this morning.  She has been able to hold down some Coke and crackers, but otherwise she has no appetite.  She states she has thrown up about 4 times today.  On exam, she is maintaining her blood pressures well at 143/72.  Her oxygen saturation is 96%.  Lungs are clear to auscultation, no wheezes or crackles appreciated.  She does have lower extremity edema 1+ to the ankles.  No abdominal pain.  Plan: Constellation of signs and symptoms favored to reflect acute viral illness COVID flu and RSV all negative today. No role for antiviral medications at this time. Zofran given to help with symptoms. Return precautions discussed in case of worsening symptoms, respiratory failure, heart failure exacerbation, etc.

## 2023-09-18 NOTE — Telephone Encounter (Signed)
Attempted to call patient x2 yesterday, will try again tomorrow. If she calls back, please let her know that she does not have COVID, Flu or RSV.

## 2023-09-18 NOTE — Telephone Encounter (Signed)
Requesting lab results, please call pt back.

## 2023-09-23 NOTE — Progress Notes (Signed)
 Internal Medicine Clinic Attending  Case discussed with the resident at the time of the visit.  We reviewed the resident's history and exam and pertinent patient test results.  I agree with the assessment, diagnosis, and plan of care documented in the resident's note.

## 2023-09-29 NOTE — Telephone Encounter (Signed)
 Received a notice from [facility] stating they have been unable to reach the pt. I called the pt. She said she had spoken with someone back in January and was told she would have to pay $500 deductible before she could get her machine but she said she would give them a call back to discuss. She has their phone number [phone].

## 2023-10-23 ENCOUNTER — Emergency Department (HOSPITAL_COMMUNITY)
Admission: EM | Admit: 2023-10-23 | Discharge: 2023-10-24 | Disposition: A | Discharge status: Home / Self Care | Attending: Emergency Medicine | Admitting: Emergency Medicine

## 2023-10-23 ENCOUNTER — Other Ambulatory Visit: Payer: Self-pay

## 2023-10-23 ENCOUNTER — Encounter (HOSPITAL_COMMUNITY): Payer: Self-pay

## 2023-10-23 ENCOUNTER — Emergency Department (HOSPITAL_COMMUNITY)

## 2023-10-23 DIAGNOSIS — J9811 Atelectasis: Secondary | ICD-10-CM | POA: Diagnosis not present

## 2023-10-23 DIAGNOSIS — R051 Acute cough: Secondary | ICD-10-CM | POA: Insufficient documentation

## 2023-10-23 DIAGNOSIS — I509 Heart failure, unspecified: Secondary | ICD-10-CM | POA: Insufficient documentation

## 2023-10-23 DIAGNOSIS — Z7901 Long term (current) use of anticoagulants: Secondary | ICD-10-CM | POA: Diagnosis not present

## 2023-10-23 DIAGNOSIS — R0602 Shortness of breath: Secondary | ICD-10-CM | POA: Diagnosis not present

## 2023-10-23 DIAGNOSIS — D72829 Elevated white blood cell count, unspecified: Secondary | ICD-10-CM | POA: Diagnosis not present

## 2023-10-23 LAB — CBC WITH DIFFERENTIAL/PLATELET
Abs Immature Granulocytes: 0.03 10*3/uL (ref 0.00–0.07)
Basophils Absolute: 0 10*3/uL (ref 0.0–0.1)
Basophils Relative: 0 %
Eosinophils Absolute: 0.2 10*3/uL (ref 0.0–0.5)
Eosinophils Relative: 2 %
HCT: 44.9 % (ref 36.0–46.0)
Hemoglobin: 14.4 g/dL (ref 12.0–15.0)
Immature Granulocytes: 0 %
Lymphocytes Relative: 7 %
Lymphs Abs: 1 10*3/uL (ref 0.7–4.0)
MCH: 26.7 pg (ref 26.0–34.0)
MCHC: 32.1 g/dL (ref 30.0–36.0)
MCV: 83.3 fL (ref 80.0–100.0)
Monocytes Absolute: 0.9 10*3/uL (ref 0.1–1.0)
Monocytes Relative: 6 %
Neutro Abs: 12 10*3/uL — ABNORMAL HIGH (ref 1.7–7.7)
Neutrophils Relative %: 85 %
Platelets: 245 10*3/uL (ref 150–400)
RBC: 5.39 MIL/uL — ABNORMAL HIGH (ref 3.87–5.11)
RDW: 14.1 % (ref 11.5–15.5)
WBC: 14.3 10*3/uL — ABNORMAL HIGH (ref 4.0–10.5)
nRBC: 0 % (ref 0.0–0.2)

## 2023-10-23 LAB — BASIC METABOLIC PANEL
Anion gap: 14 (ref 5–15)
BUN: 9 mg/dL (ref 8–23)
CO2: 23 mmol/L (ref 22–32)
Calcium: 9.1 mg/dL (ref 8.9–10.3)
Chloride: 108 mmol/L (ref 98–111)
Creatinine, Ser: 0.94 mg/dL (ref 0.44–1.00)
GFR, Estimated: >60 mL/min (ref >60)
Glucose, Bld: 138 mg/dL — ABNORMAL HIGH (ref 70–99)
Potassium: 3.9 mmol/L (ref 3.5–5.1)
Sodium: 145 mmol/L (ref 135–145)

## 2023-10-23 LAB — RESP PANEL BY RT-PCR (RSV, FLU A&B, COVID)  RVPGX2
Influenza A by PCR: NEGATIVE
Influenza B by PCR: NEGATIVE
Resp Syncytial Virus by PCR: NEGATIVE
SARS Coronavirus 2 by RT PCR: NEGATIVE

## 2023-10-23 LAB — TROPONIN I (HIGH SENSITIVITY): Troponin I (High Sensitivity): 6 ng/L (ref <18)

## 2023-10-23 LAB — BRAIN NATRIURETIC PEPTIDE: B Natriuretic Peptide: 41.4 pg/mL (ref 0.0–100.0)

## 2023-10-23 NOTE — ED Triage Notes (Signed)
 Patient complaining of shortness of breath, chills, and cough x1 day. Shortness of breath at rest and worse when speaking. Denies chest pain.

## 2023-10-23 NOTE — ED Notes (Signed)
 Pt ambulated up and down the hall with little assistance. O2 saturations 94%-96% RA while ambulating.

## 2023-10-23 NOTE — ED Provider Notes (Signed)
 [facility]-EM[facility]GENCY DEPT [facility] Emergency Department Provider Note MRN:  [id number]  Arrival date & time: 10/24/23     Chief Complaint   Shortness of Breath   History of Present Illness   [name] is a 68 y.o. year-old female presents to the ED with chief complaint of shortness of breath that has been worsening over the past 2 days.  She reports associated productive cough.  She states that she has unchanged lower extremity pitting edema.  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  She denies having missed any doses.  She reports associated URI symptoms and has had hoarseness of her voice as well.  She states that she feels significantly more short of breath with activity.  She denies an pain.  She states that she had a fever earlier.  History provided by patient.   Review of Systems  Pertinent positive and negative review of systems noted in HPI.    Physical Exam   Vitals:   10/24/23 0015 10/24/23 0145  BP: 137/73 118/68  Pulse: 88 99  Resp: 19 18  Temp:    SpO2: 96% 93%    CONSTITUTIONAL:  SOB-appearing, NAD NEURO:  Alert and oriented x 3, CN 3-12 grossly intact EYES:  eyes equal and reactive ENT/NECK:  Supple, no stridor, hoarse voice, whispers during exam CARDIO:  normal rate, regular rhythm, appears well-perfused  PULM:  No respiratory distress, CTAB GI/GU:  non-distended, no focal tenderness MSK/SPINE:  No gross deformities, no edema, moves all extremities  SKIN:  no rash, atraumatic   *Additional and/or pertinent findings included in MDM below  Diagnostic and Interventional Summary    EKG Interpretation Date/Time:  Thursday October 23 2023 21:41:23 EST Ventricular Rate:  94 PR Interval:  148 QRS Duration:  76 QT Interval:  318 QTC Calculation: 397 R Axis:   39  Text Interpretation: Normal sinus rhythm Septal infarct , age undetermined Abnormal ECG Confirmed by Zadie Rhine ([id number]) on 10/23/2023 11:14:07 PM        Labs Reviewed  BASIC METABOLIC PANEL - Abnormal; Notable for the following components:      Result Value   Glucose, Bld 138 (*)    All other components within normal limits  CBC WITH DIFFERENTIAL/PLATELET - Abnormal; Notable for the following components:   WBC 14.3 (*)    RBC 5.39 (*)    Neutro Abs 12.0 (*)    All other components within normal limits  RESP PANEL BY RT-PCR (RSV, FLU A&B, COVID)  [id number]  BRAIN NATRIURETIC PEPTIDE  TROPONIN I (HIGH SENSITIVITY)  TROPONIN I (HIGH SENSITIVITY)    DG Chest Port 1 View  Final Result      Medications  ipratropium-albuterol (DUONEB) 0.5-2.5 (3) MG/3ML nebulizer solution 3 mL (3 mLs Nebulization Given 10/24/23 0153)  doxycycline (VIBRA-TABS) tablet 100 mg (100 mg Oral Given 10/24/23 0250)  benzonatate (TESSALON) capsule 100 mg (100 mg Oral Given 10/24/23 0250)     Procedures  /  Critical Care Procedures  ED Course and Medical Decision Making  I have reviewed the triage vital signs, the nursing notes, and pertinent available records from the EMR.  Social Determinants Affecting Complexity of Care: Patient has no clinically significant social determinants affecting this chief complaint..   ED Course: Clinical Course as of 10/24/23 0255  Thu Oct 23, 2023  2339 Pt ambulated up and down the hall with little assistance. O2 saturations 94%-96% RA while ambulating. [RB]  2339 CBC with DifferentialMarland Kitchen)  Leukocytosis, has had cough and fever.  COVID/flu/RSV negative. [RB]  2339 Basic metabolic panel(!) No significant electrolyte abnormality [RB]  2339 Brain natriuretic peptide Normal BNP [RB]  2339 Troponin I (High Sensitivity) Trop is 6, repeat pending. [RB]  2340 Resp panel by RT-PCR (RSV, Flu A&B, Covid) Anterior Nasal Swab [RB]  Fri Oct 24, 2023  0154 EMTALA transfer form filed by mistake.  Disregard. [RB]  0254 Brain natriuretic peptide Normal, no effusions on CXR.  Doubt CHF exacerbation. [RB]  0255 CBC with Differential(!) She  does have elevated WBC, might benefit from abx trial due to coarse cough. [RB]  0255 Troponin I (High Sensitivity) Trops are negative, non-ischemic EKG, doubt ACS. [RB]    Clinical Course User Index [RB] Roxy Horseman, PA-C    Medical Decision Making Patient here with cough, chills, and some shortness of breath for the past day or so.  She also reports that she has had URI symptoms, and laryngitis.  She has an appointment with her doctor on Monday.  She does have history of CHF, but denies any new or worsening leg swelling.  She does have history of PE, but reports compliance with her Eliquis.  She denies having chest pain.  Labs and imaging are reassuring.  Discussed further below.  Patient ambulates maintaining greater than 94% pulse oxygenation.  She does not appear toxic.  I do not believe that she requires any further workup, advanced imaging, or hospitalization at this time.  Patient seen by and discussed with Dr. Bebe Shaggy, who recommends antibiotic trial and PCP follow-up.  Problems Addressed: Acute cough: acute illness or injury  Amount and/or Complexity of Data Reviewed Labs:  Decision-making details documented in ED Course. Radiology: ordered and independent interpretation performed.    Details: No obvious large infiltrate or effusion ECG/medicine tests: ordered and independent interpretation performed.    Details: NSR, no acute ischemic changes  Risk Prescription drug management.         Consultants: No consultations were needed in caring for this patient.   Treatment and Plan: I considered admission due to patient's initial presentation, but after considering the examination and diagnostic results, patient will not require admission and can be discharged with outpatient follow-up.  Patient seen by and discussed with attending physician, Dr. Bebe Shaggy, who agrees with plan.  Final Clinical Impressions(s) / ED Diagnoses     ICD-10-CM   1. Shortness of  breath  R06.02     2. Acute cough  R05.1       ED Discharge Orders          Ordered    benzonatate (TESSALON) 100 MG capsule  2 times daily PRN        10/24/23 0237    doxycycline (VIBRAMYCIN) 100 MG capsule  2 times daily        10/24/23 6295              Discharge Instructions Discussed with and Provided to Patient:     Discharge Instructions              Roxy Horseman, PA-C 10/24/23 0255    Zadie Rhine, MD 10/24/23 [phone]

## 2023-10-23 NOTE — ED Notes (Signed)
Blue top sent down to lab as save tube.

## 2023-10-24 ENCOUNTER — Other Ambulatory Visit: Payer: Self-pay

## 2023-10-24 ENCOUNTER — Other Ambulatory Visit (HOSPITAL_COMMUNITY): Payer: Self-pay

## 2023-10-24 LAB — TROPONIN I (HIGH SENSITIVITY): Troponin I (High Sensitivity): 5 ng/L (ref <18)

## 2023-10-24 MED ORDER — IPRATROPIUM-ALBUTEROL 0.5-2.5 (3) MG/3ML IN SOLN
3.0000 mL | Freq: Once | RESPIRATORY_TRACT | Status: AC
  Administered 2023-10-24: 3 mL via RESPIRATORY_TRACT
  Filled 2023-10-24: qty 3

## 2023-10-24 MED ORDER — DOXYCYCLINE HYCLATE 100 MG PO CAPS
100.0000 mg | ORAL_CAPSULE | Freq: Two times a day (BID) | ORAL | 0 refills | Status: DC
  Filled 2023-10-24: qty 20, 10d supply, fill #0

## 2023-10-24 MED ORDER — BENZONATATE 100 MG PO CAPS
100.0000 mg | ORAL_CAPSULE | Freq: Two times a day (BID) | ORAL | 0 refills | Status: DC | PRN
  Filled 2023-10-24: qty 20, 10d supply, fill #0

## 2023-10-24 MED ORDER — DOXYCYCLINE HYCLATE 100 MG PO TABS
100.0000 mg | ORAL_TABLET | Freq: Once | ORAL | Status: AC
  Administered 2023-10-24: 100 mg via ORAL
  Filled 2023-10-24: qty 1

## 2023-10-24 MED ORDER — BENZONATATE 100 MG PO CAPS
100.0000 mg | ORAL_CAPSULE | Freq: Once | ORAL | Status: AC
  Administered 2023-10-24: 100 mg via ORAL
  Filled 2023-10-24: qty 1

## 2023-10-24 NOTE — Discharge Instructions (Addendum)
 Marland Kitchen

## 2023-10-24 NOTE — ED Notes (Signed)
..  The patient is A&OX4, wheeled out of ED via wheelchair, NAD. Pt verbalized understanding of d/c instructions and follow up care.

## 2023-10-27 ENCOUNTER — Ambulatory Visit (INDEPENDENT_AMBULATORY_CARE_PROVIDER_SITE_OTHER): Payer: Medicare PPO | Admitting: Internal Medicine

## 2023-10-27 ENCOUNTER — Other Ambulatory Visit: Payer: Self-pay

## 2023-10-27 ENCOUNTER — Encounter (HOSPITAL_COMMUNITY): Payer: Self-pay

## 2023-10-27 ENCOUNTER — Encounter: Payer: Self-pay | Admitting: Internal Medicine

## 2023-10-27 ENCOUNTER — Telehealth: Payer: Self-pay

## 2023-10-27 ENCOUNTER — Other Ambulatory Visit (HOSPITAL_COMMUNITY): Payer: Self-pay

## 2023-10-27 VITALS — BP 121/60 | HR 98 | Temp 98.3°F | Ht 63.0 in | Wt 218.8 lb

## 2023-10-27 DIAGNOSIS — B349 Viral infection, unspecified: Secondary | ICD-10-CM | POA: Diagnosis not present

## 2023-10-27 DIAGNOSIS — R7303 Prediabetes: Secondary | ICD-10-CM | POA: Diagnosis not present

## 2023-10-27 DIAGNOSIS — E669 Obesity, unspecified: Secondary | ICD-10-CM

## 2023-10-27 DIAGNOSIS — Z6838 Body mass index (BMI) 38.0-38.9, adult: Secondary | ICD-10-CM | POA: Diagnosis not present

## 2023-10-27 DIAGNOSIS — E785 Hyperlipidemia, unspecified: Secondary | ICD-10-CM | POA: Diagnosis not present

## 2023-10-27 DIAGNOSIS — I10 Essential (primary) hypertension: Secondary | ICD-10-CM

## 2023-10-27 DIAGNOSIS — G4733 Obstructive sleep apnea (adult) (pediatric): Secondary | ICD-10-CM | POA: Diagnosis not present

## 2023-10-27 MED ORDER — MOUNJARO 2.5 MG/0.5ML ~~LOC~~ SOAJ
2.5000 mg | SUBCUTANEOUS | 3 refills | Status: DC
  Filled 2023-10-27: qty 2, 28d supply, fill #0

## 2023-10-27 NOTE — Assessment & Plan Note (Addendum)
 She was seen in the ED on 3/6 for shortness of breath.  Oxygen levels at that visit were okay.  Panel was checked which was negative.  Labs are reassuring.  Chest x-ray showed no consolidations.  She was prescribed doxycycline 100 mg twice daily for 10 days.  She has completed 4 days of this course.  She is starting to feel better.  Her voice has been hoarse since Thursday.  She has been drinking plenty of fluids and hot teas at home.  Her shortness of breath has improved in the last couple of days.  On exam there is no wheezing or rhonchi to auscultation.  For her vitals her oxygen saturations are at 97% after ambulating from front entrance of hospital. P: Asked patient to discontinue doxycycline and continue with symptomatic treatment for viral illness

## 2023-10-27 NOTE — Telephone Encounter (Signed)
 Prior Authorization for patient [name] 2.5MG /0.5ML auto-injectors) came through on cover my meds was submitted with last office notes awaiting approval or denial.  ZOX:[id number]

## 2023-10-27 NOTE — Progress Notes (Addendum)
 Subjective:  CC: HTN follow-up  HPI:  [name] is a 68 y.o. female with a past medical history of COPD, HFpEF, hypertension, history of DVT who presents today for follow-up on hypertension.   She was seen in the emergency room on 3/6 for shortness of breath.  She was diagnosed with community-acquired pneumonia and prescribed doxycycline and given Tessalon Perles.  Prior to that she was seen in November for follow-up on her blood pressure.  Amlodipine was increased from 5 to 10 mg and she was continued on Coreg 6.25 mg daily.  Please see problem based assessment and plan for additional details.  Past Medical History:  Diagnosis Date   (HFpEF) heart failure with preserved ejection fraction (HCC)    Acute kidney injury (HCC)    Bronchitis due to COVID-19 virus 03/23/2019   DVT (deep venous thrombosis) (HCC)    Hyperlipidemia    Hypertension    Leg pain    right   Obesity (BMI 30-39.9)    OSA (obstructive sleep apnea)    PE (pulmonary embolism)    Prediabetes    Sleep apnea    Trigger finger, left ring finger 01/04/2022    MEDICATIONS:  Amlodipine 10 mg Carvedilol 6.25 mg BID Loratadine 10 mg daily Doxycycline 100 mg twice daily Spiriva Xarelto 20 mg Atorvastatin 40 mg omeprazole 20 mg  Family History  Problem Relation Age of Onset   Heart attack Mother    Heart attack Father    Heart attack Sister    Hypertension Other    Heart disease Other    Diabetes Other    Coronary artery disease Other     Past Surgical History:  Procedure Laterality Date   ABDOMINAL HYSTERECTOMY     APPENDECTOMY     BACK SURGERY     CHOLECYSTECTOMY     Gall Bladder removal   COLONOSCOPY WITH PROPOFOL N/A 05/06/2017   Procedure: COLONOSCOPY WITH PROPOFOL;  Surgeon: Baldo Bonds, MD;  Location: [facility];  Service: Endoscopy;  Laterality: N/A;   LAMINOTOMY     right at L4-5 with a disc bulge and superimposed right lateral recess protrusion encroaching on the L5  root   SPINE SURGERY     decompression surgery     Social History   Socioeconomic History   Marital status: Legally Separated    Spouse name: Not on file   Number of children: 2   Years of education: Not on file   Highest education level: Not on file  Occupational History   Not on file  Tobacco Use   Smoking status: Never   Smokeless tobacco: Never  Vaping Use   Vaping status: Never Used  Substance and Sexual Activity   Alcohol use: No   Drug use: Never   Sexual activity: Not Currently  Other Topics Concern   Not on file  Social History Narrative   Caffiene tea 1 large cup   Working retired:  Production designer, 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 14, greatkids 15)   Social Drivers of Corporate investment banker Strain: Medium Risk (07/02/2022)   Overall Financial Resource Strain (CARDIA)    Difficulty of Paying Living Expenses: Somewhat hard  Food Insecurity: No Food Insecurity (07/02/2022)   Hunger Vital Sign    Worried About Running Out of Food in the Last Year: Never true    Ran Out of Food in the Last Year: Never true  Transportation Needs: No Transportation Needs (07/02/2022)   PRAPARE -  Administrator, Civil Service (Medical): No    Lack of Transportation (Non-Medical): No  Physical Activity: Inactive (07/02/2022)   Exercise Vital Sign    Days of Exercise per Week: 0 days    Minutes of Exercise per Session: 0 min  Stress: No Stress Concern Present (07/02/2022)   [employer] of Occupational Health - Occupational Stress Questionnaire    Feeling of Stress : Only a little  Social Connections: Moderately Integrated (07/02/2022)   Social Connection and Isolation Panel [NHANES]    Frequency of Communication with Friends and Family: Never    Frequency of Social Gatherings with Friends and Family: Once a week    Attends Religious Services: More than 4 times per year    Active Member of [employer] or Organizations: Yes    Attends Engineer, structural:  More than 4 times per year    Marital Status: Married  Catering manager Violence: Not At Risk (07/02/2022)   Humiliation, Afraid, Rape, and Kick questionnaire    Fear of Current or Ex-Partner: No    Emotionally Abused: No    Physically Abused: No    Sexually Abused: No    Review of Systems: ROS negative except for what is noted on the assessment and plan.  Objective:   Vitals:   10/27/23 0954  BP: 121/60  Pulse: 98  Temp: 98.3 F (36.8 C)  TempSrc: Oral  SpO2: 97%  Weight: 218 lb 12.8 oz (99.2 kg)  Height: 5\' 3"  (1.6 m)    Physical Exam: Constitutional: Voice is hoarse, otherwise well-appearing, in no acute distress HENT: No erythema of pharynx Cardiovascular: regular rate and rhythm, no m/r/g Pulmonary/Chest: normal work of breathing on room air, lungs clear to auscultation bilaterally MSK: normal bulk and tone Neurological: normal gait Skin: warm and dry  Assessment & Plan:  Acute viral syndrome She was seen in the ED on 3/6 for shortness of breath.  Oxygen levels at that visit were okay.  Panel was checked which was negative.  Labs are reassuring.  Chest x-ray showed no consolidations.  She was prescribed doxycycline 100 mg twice daily for 10 days.  She has completed 4 days of this course.  She is starting to feel better.  Her voice has been hoarse since Thursday.  She has been drinking plenty of fluids and hot teas at home.  Her shortness of breath has improved in the last couple of days.  On exam there is no wheezing or rhonchi to auscultation.  For her vitals her oxygen saturations are at 97% after ambulating from front entrance of hospital. P: Asked patient to discontinue doxycycline and continue with symptomatic treatment for viral illness  Essential hypertension Amlodipine was increased from 5 to 10 mg at last visit.  She is also taking coreg 6.25 mg BID.  She reports adherence with these medication and is tolerating them well.  Her blood pressures are  well-controlled at 121/60. -Sinew amlodipine 10 mg and Coreg 6.25 mg twice daily  Pre-diabetes Last A1c more than a year ago at 5.8.  She is obese with BMI of 38. -Recheck A1c -Start Mounjaro 2.5 mg weekly for history of OSA, obesity, prediabetes, and hypertension  SLEEP APNEA, OBSTRUCTIVE She wears a CPAP at home.  Will start Mounjaro 2.5 mg for treatment of OSA. -Follow-up in 3 months for titration of Mounjaro  Addendum: Insurance denied Mounjaro even though it is approved by FDA for OSA.  This is not an off label indication as stated  in denial for prior authorization. Ozempic 0.25 mg sent in, Mounjaro discontinued  Addendum: Zepbound sent  Hyperlipidemia Last lipid panel in 2023 was at goal for primary prevention.  She reports adherence with atorvastatin 40 mg. -Recheck lipid panel today -continue atorvastatin 40 mg   Patient discussed with Dr. Loman Risk Reed Eifert, D.O. [facility]  PGY-3 Pager: [phone]  Phone: [phone] Date 12/03/2023  Time 9:25 AM

## 2023-10-27 NOTE — Assessment & Plan Note (Addendum)
 She wears a CPAP at home.  Will start Mounjaro 2.5 mg for treatment of OSA. -Follow-up in 3 months for titration of Mounjaro  Addendum: Insurance denied Mounjaro even though it is approved by FDA for OSA.  This is not an off label indication as stated in denial for prior authorization. Ozempic 0.25 mg sent in, Mounjaro discontinued  Addendum: Zepbound sent

## 2023-10-27 NOTE — Assessment & Plan Note (Signed)
 Last lipid panel in 2023 was at goal for primary prevention.  She reports adherence with atorvastatin 40 mg. -Recheck lipid panel today -continue atorvastatin 40 mg

## 2023-10-27 NOTE — Progress Notes (Signed)
 Internal Medicine Clinic Attending  Case discussed with the resident at the time of the visit.  We reviewed the resident's history and exam and pertinent patient test results.  I agree with the assessment, diagnosis, and plan of care documented in the resident's note.

## 2023-10-27 NOTE — Assessment & Plan Note (Signed)
 Amlodipine was increased from 5 to 10 mg at last visit.  She is also taking coreg 6.25 mg BID.  She reports adherence with these medication and is tolerating them well.  Her blood pressures are well-controlled at 121/60. -Sinew amlodipine 10 mg and Coreg 6.25 mg twice daily

## 2023-10-27 NOTE — Assessment & Plan Note (Signed)
 Last A1c more than a year ago at 5.8.  She is obese with BMI of 38. -Recheck A1c -Start Mounjaro 2.5 mg weekly for history of OSA, obesity, prediabetes, and hypertension

## 2023-10-27 NOTE — Patient Instructions (Addendum)
 Thank you, [name] for allowing [place] to provide your care today.   Cough I looked at your chest xray and did not see signs of bacterial lung infection. Id like to save the antibiotics for when you really need them so please discontinue doxycycline medication. Continue to take robitussin and drinks plenty of fluids to help as you recover.  Lab work: I am checking you labs for cholesterol and pre-diabetes (A1c)  I think you would benefit from a medication to help with weight loss. This would help decrease risk of diabetes and control blood pressure. I will sent that in today. The medication is called Mounjaro. I have started you on the lowest dose which can be increased if you tolerate medication well.  I do not think you need to complete additional pap smears in the future!  I have ordered the following labs for you:  Lab Orders         Lipid Profile         Hemoglobin A1c      I have ordered the following medication/changed the following medications:   Stop the following medications: Medications Discontinued During This Encounter  Medication Reason   doxycycline (VIBRAMYCIN) 100 MG capsule      Start the following medications: Meds ordered this encounter  Medications   tirzepatide (MOUNJARO) 2.5 MG/0.5ML Pen    Sig: Inject 2.5 mg into the skin once a week.    Dispense:  2 mL    Refill:  3     Follow up: 3 months   We look forward to seeing you next time. Please call our clinic at [phone] if you have any questions or concerns. The best time to call is Monday-Friday from 9am-4pm, but there is someone available 24/7. If after hours or the weekend, call the main hospital number and ask for the Internal Medicine Resident On-Call. If you need medication refills, please notify your pharmacy one week in advance and they will send [place] a request.   Thank you for trusting me with your care. Wishing you the best!   Rudene Christians, DO [facility]

## 2023-10-28 ENCOUNTER — Other Ambulatory Visit (HOSPITAL_COMMUNITY): Payer: Self-pay

## 2023-10-28 LAB — LIPID PANEL
Chol/HDL Ratio: 3.5 ratio (ref 0.0–4.4)
Cholesterol, Total: 136 mg/dL (ref 100–199)
HDL: 39 mg/dL — ABNORMAL LOW (ref >39)
LDL Chol Calc (NIH): 81 mg/dL (ref 0–99)
Triglycerides: 82 mg/dL (ref 0–149)
VLDL Cholesterol Cal: 16 mg/dL (ref 5–40)

## 2023-10-28 LAB — HEMOGLOBIN A1C
Est. average glucose Bld gHb Est-mCnc: 128 mg/dL
Hgb A1c MFr Bld: 6.1 % — ABNORMAL HIGH (ref 4.8–5.6)

## 2023-10-28 NOTE — Telephone Encounter (Signed)
 Dear [name], We're writing to share some news about your  recent prior authorization prescription request.  Unfortunately, your coverage or payment under  your Medicare Part D benefit for MOUNJARO  2.5 MG/0.5 ML PEN was denied. Here's why we made our decision: You asked for the drug above for your  Obstructive sleep apnea, Impaired fasting  glucose, and Pre-diabetes. This is an off-label  use that is not medically accepted. The  Medicare rule in the Prescription Drug Benefit  Manual (Chapter 6, Section 10.6) says a drug  must be used for a medically accepted  indication (covered use). Off-label use is  medically accepted when there is proof in one  or more of the drug guides that the drug works  for your condition. We look at the two major  drug guides (compendia): the DRUGDEX  Information System and the [facility]  Formulary Service Drug Information (AHFS-DI).  [employer] has decided that there is no proof in  either drug guide that this drug works for your  condition. Additionally, The Medicare rule in the  Prescription Drug Manual (Chapter 6, Section  20.1) says drugs used to help you lose weight  are excluded from Medicare Part D coverage.  [employer] follows Medicare rules. Per Medicare  rules, it is not covered.

## 2023-10-29 ENCOUNTER — Other Ambulatory Visit (HOSPITAL_COMMUNITY): Payer: Self-pay

## 2023-10-29 ENCOUNTER — Telehealth: Payer: Self-pay

## 2023-10-29 MED ORDER — SEMAGLUTIDE(0.25 OR 0.5MG/DOS) 2 MG/3ML ~~LOC~~ SOPN
0.2500 mg | PEN_INJECTOR | SUBCUTANEOUS | 3 refills | Status: DC
  Filled 2023-10-29: qty 3, 28d supply, fill #0

## 2023-10-29 NOTE — Telephone Encounter (Signed)
 Prior Authorization for patient (Ozempic (0.25 or 0.5 MG/DOSE) 2MG /3ML pen-injectors) came through on cover my meds was submitted with last office notes and labs awaiting approval or denial.  ZOX:[id number]

## 2023-10-29 NOTE — Telephone Encounter (Signed)
 Appeal has been stated and faxed to the patients insurance with supporting documents from Dr.Masters. I called the patient unable to reach her, so I lvm.

## 2023-10-29 NOTE — Telephone Encounter (Addendum)
 Dear [name], We're writing to share some news about your  recent prior authorization prescription request.  Unfortunately, your coverage or payment under  your Medicare Part D benefit for OZEMPIC  0.25-0.5 MG/DOSE PEN was denied. Here's why we made our decision: You asked for the drug above for your  prediabetes, obstructive sleep apnea,  hypertension, hyperlipidemia, and  gastroesophageal reflux disease. This is an offlabel use that is not medically accepted. The  Medicare rule in the Prescription Drug Benefit  Manual (Chapter 6, Section 10.6) says a drug  must be used for a medically accepted  indication (covered use). Off-label use is  medically accepted when there is proof in one  or more of the drug guides that the drug works  for your condition. We look at the two major  drug guides (compendia): the DRUGDEX  Information System and the [facility]  Formulary Service Drug Information (AHFS-DI).  [employer] has decided that there is no proof in  either drug guide that this drug works for your  condition. In addition, the Medicare rule in the  Prescription Drug Manual (Chapter 6, Section  20.1) says drugs used to help you lose weight  are excluded from Medicare Part D coverage.  Per Medicare rules, it is not covered.   Appeal has been submitted.

## 2023-10-29 NOTE — Addendum Note (Signed)
 Addended by: Lucille Passy on: 10/29/2023 08:58 AM   Modules accepted: Orders

## 2023-10-30 NOTE — Telephone Encounter (Signed)
 Appeal for [facility] and Ozempic was sent yesterday,awaiting approval or denial for both.

## 2023-12-01 ENCOUNTER — Other Ambulatory Visit (HOSPITAL_COMMUNITY): Payer: Self-pay

## 2023-12-02 ENCOUNTER — Other Ambulatory Visit (HOSPITAL_COMMUNITY): Payer: Self-pay

## 2023-12-02 ENCOUNTER — Ambulatory Visit (INDEPENDENT_AMBULATORY_CARE_PROVIDER_SITE_OTHER): Payer: Medicare PPO | Admitting: Adult Health

## 2023-12-02 ENCOUNTER — Telehealth: Payer: Self-pay

## 2023-12-02 ENCOUNTER — Encounter: Payer: Self-pay | Admitting: Adult Health

## 2023-12-02 ENCOUNTER — Ambulatory Visit: Payer: Self-pay | Admitting: Student

## 2023-12-02 VITALS — BP 128/76 | HR 81 | Ht 63.0 in | Wt 213.2 lb

## 2023-12-02 DIAGNOSIS — G4719 Other hypersomnia: Secondary | ICD-10-CM | POA: Diagnosis not present

## 2023-12-02 DIAGNOSIS — E538 Deficiency of other specified B group vitamins: Secondary | ICD-10-CM

## 2023-12-02 DIAGNOSIS — G4733 Obstructive sleep apnea (adult) (pediatric): Secondary | ICD-10-CM

## 2023-12-02 DIAGNOSIS — E559 Vitamin D deficiency, unspecified: Secondary | ICD-10-CM

## 2023-12-02 DIAGNOSIS — R7303 Prediabetes: Secondary | ICD-10-CM

## 2023-12-02 DIAGNOSIS — I82622 Acute embolism and thrombosis of deep veins of left upper extremity: Secondary | ICD-10-CM

## 2023-12-02 DIAGNOSIS — G4734 Idiopathic sleep related nonobstructive alveolar hypoventilation: Secondary | ICD-10-CM

## 2023-12-02 DIAGNOSIS — R799 Abnormal finding of blood chemistry, unspecified: Secondary | ICD-10-CM | POA: Diagnosis not present

## 2023-12-02 DIAGNOSIS — E079 Disorder of thyroid, unspecified: Secondary | ICD-10-CM | POA: Diagnosis not present

## 2023-12-02 DIAGNOSIS — K219 Gastro-esophageal reflux disease without esophagitis: Secondary | ICD-10-CM | POA: Diagnosis not present

## 2023-12-02 DIAGNOSIS — J3089 Other allergic rhinitis: Secondary | ICD-10-CM

## 2023-12-02 DIAGNOSIS — Z139 Encounter for screening, unspecified: Secondary | ICD-10-CM | POA: Diagnosis not present

## 2023-12-02 DIAGNOSIS — I10 Essential (primary) hypertension: Secondary | ICD-10-CM

## 2023-12-02 DIAGNOSIS — E785 Hyperlipidemia, unspecified: Secondary | ICD-10-CM

## 2023-12-02 DIAGNOSIS — R7989 Other specified abnormal findings of blood chemistry: Secondary | ICD-10-CM | POA: Diagnosis not present

## 2023-12-02 DIAGNOSIS — Z6837 Body mass index (BMI) 37.0-37.9, adult: Secondary | ICD-10-CM | POA: Diagnosis not present

## 2023-12-02 NOTE — Patient Instructions (Addendum)
 Thank you, Ms.Lazaro Prime for allowing us  to provide your care today. Today we discussed your allergies and medication access.  I have ordered the following labs for you:  Lab Orders  No laboratory test(s) ordered today     Tests ordered today:  None  Referrals ordered today:   Referral Orders  No referral(s) requested today     I have ordered the following medication/changed the following medications:   Stop the following medications: There are no discontinued medications.   Start the following medications: No orders of the defined types were placed in this encounter.    Follow up: 3 weeks for medication management (Ozempic/Mounjaro and Xarelto)   Remember:   - No changes to your medications today.   - You have been given a 3 week sample of Xarelto. It is very important that you come back in 3 weeks so we can determine if Arma Berkshire, our pharmacy technician, was able to provide information about a patient assistance program for you so that you do not go without your medications.   - If Ozempic or Mounjaro are approved after submitting appeals our office will call you. We can also follow up on that at your 3 week appointment.   - We are glad you are feeling better. If you develop sudden chest pain, worsening cough, or fever please call our clinic back as you may need to be re-evaluated.   Should you have any questions or concerns please call the internal medicine clinic at [phone].     Tallia Moehring Arellano Zameza, MD PGY-1 Internal Medicine Teaching Progam [facility]

## 2023-12-02 NOTE — Assessment & Plan Note (Addendum)
 Patient previously prescribed Mounjaro for OSA/weight loss. This was denied so Ozempic was prescribed. Ozempic was also recently denied. Patient has not been able to start therapy. Seems like an appeal has been submitted, will continue to wait to see if either therapy is approved.  Plan - Follow up on approva/denial at next appointment  - Patient referred to nutrition services for additional support with healthy habits/lifestyle modification

## 2023-12-02 NOTE — Assessment & Plan Note (Signed)
 See note under Morbid Obesity.

## 2023-12-02 NOTE — Telephone Encounter (Signed)
-----   Message from Sonja Box sent at 12/02/2023  9:53 AM EDT ----- [name]! This patient was just seen for an acute visit, but she let me know she will need to pay $587 to pick up her Xarelto (needs to meet her deductible), then it will cost $40 after which is also cost prohibitive. We gave her a 3 week sample for now. She says she's applied for patient assistance before, can you help her reapply? Thank you so much!  Priscila

## 2023-12-02 NOTE — Progress Notes (Signed)
 Internal Medicine Clinic Attending  Case discussed with the resident at the time of the visit.  We reviewed the resident's history and exam and pertinent patient test results.  I agree with the assessment, diagnosis, and plan of care documented in the resident's note.

## 2023-12-02 NOTE — Assessment & Plan Note (Signed)
>>  ASSESSMENT AND PLAN FOR DEEP VEIN THROMBOSIS (DVT) (HCC) WRITTEN ON 12/02/2023  3:08 PM BY ARELLANO ZAMEZA, PRISCILA, MD  Patient on Xarelto  20 mg daily. Has been taking consistently. Per patient, she received a message from her pharmacy that she would not be able to pick up her refills, including Xarelto , if she does not pay $587.10. Office staff helped to call pharmacy, clarified she needs to meet her deductible then Xarelto  would be  $40 copay thereafter. This is still cost prohibitive to patient. Samples available today, will give to patient to help bridge therapy. Will reach out to [employer] (pharmacy tech) for help with patient assistance application. Will follow up with patient at 5/06 visit.  Plan - Continue Xarelto  20 mg daily  - Samples given today (21 days) - Arma Berkshire to reach out to patient with patient assistance application

## 2023-12-02 NOTE — Assessment & Plan Note (Signed)
 Patient on Xarelto 20 mg daily. Has been taking consistently. Per patient, she received a message from her pharmacy that she would not be able to pick up her refills, including Xarelto, if she does not pay $587.10. Office staff helped to call pharmacy, clarified she needs to meet her deductible then Xarelto would be  $40 copay thereafter. This is still cost prohibitive to patient. Samples available today, will give to patient to help bridge therapy. Will reach out to [employer] (pharmacy tech) for help with patient assistance application. Will follow up with patient at 5/06 visit.  Plan - Continue Xarelto 20 mg daily  - Samples given today (21 days) - Arma [employer]kshire to reach out to patient with patient assistance application

## 2023-12-02 NOTE — Progress Notes (Signed)
 [facility] [address] [place]. [place] [address] [phone]       OFFICE FOLLOW UP NOTE  [name] Date of Birth:  Oct 26, 1955 Medical Record Number:  [id number]    Primary neurologist: Dr. Frances Furbish Reason for visit: Initial CPAP follow-up    SUBJECTIVE:   CHIEF COMPLAINT:  Chief Complaint  Patient presents with   Follow-up    Pt in room 8. Alone. Here for initial cpap follow up. Pt reports doing well on cpap machine. No concerns.    Follow-up visit:  Prior visit: 08/06/2023 with Dr. Frances Furbish (initial consult visit)  Brief HPI:   [name] is a 68 y.o. female with complex medical history who was evaluated by Dr. Frances Furbish in 07/2023 for evaluation of prior diagnosis of OSA. Prior dx of OSA in 2006 but has not been on tx over the past 5 years. ESS 18/24. Sleep study 08/2023 showed overall mild sleep apnea more pronounced in REM sleep, with total AHI of 6.4/hr, REM AHI of 22.7/h and O2 nadir of 74% with evidence of nocturnal hypoxemia. AutoPAP initiated 09/2023.     Interval history:  Patient is being seen today for initial CPAP compliance visit. Reports overall doing well on CPAP. Does believe she is sleeping better and feeling more rested upon awakening but still struggling with daytime fatigue especially in the later afternoon.  Currently using nasal pillow and tolerating well. ESS 15/24, prior to CPAP 18/24.  Has not had any recent lab work to look for other causes of daytime fatigue. Does does womens about a fusion multivitamin daily.  PCP is working on Occupational psychologist for [employer] or Ozempic to assist with weight loss.        ROS:   14 system review of systems performed and negative with exception of those listed in HPI  PMH:  Past Medical History:  Diagnosis Date   (HFpEF) heart failure with preserved ejection fraction (HCC)    Acute kidney injury (HCC)    Bronchitis due to COVID-19 virus 03/23/2019   DVT (deep venous thrombosis)  (HCC)    Hyperlipidemia    Hypertension    Leg pain    right   Obesity (BMI 30-39.9)    OSA (obstructive sleep apnea)    PE (pulmonary embolism)    Prediabetes    Sleep apnea    Trigger finger, left ring finger 01/04/2022    PSH:  Past Surgical History:  Procedure Laterality Date   ABDOMINAL HYSTERECTOMY     APPENDECTOMY     BACK SURGERY     CHOLECYSTECTOMY     Gall Bladder removal   COLONOSCOPY WITH PROPOFOL N/A 05/06/2017   Procedure: COLONOSCOPY WITH PROPOFOL;  Surgeon: Charlott Rakes, MD;  Location: [facility];  Service: Endoscopy;  Laterality: N/A;   LAMINOTOMY     right at L4-5 with a disc bulge and superimposed right lateral recess protrusion encroaching on the L5 root   SPINE SURGERY     decompression surgery    Social History:  Social History   Socioeconomic History   Marital status: Legally Separated    Spouse name: Not on file   Number of children: 2   Years of education: Not on file   Highest education level: Not on file  Occupational History   Not on file  Tobacco Use   Smoking status: Never   Smokeless tobacco: Never  Vaping Use   Vaping status: Never Used  Substance and Sexual Activity   Alcohol use:  No   Drug use: Never   Sexual activity: Not Currently  Other Topics Concern   Not on file  Social History Narrative   Caffiene tea 1 large cup   Working retired:  Production designer, 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 14, greatkids 15)   Social Drivers of Corporate investment banker Strain: Medium Risk (07/02/2022)   Overall Financial Resource Strain (CARDIA)    Difficulty of Paying Living Expenses: Somewhat hard  Food Insecurity: No Food Insecurity (07/02/2022)   Hunger Vital Sign    Worried About Running Out of Food in the Last Year: Never true    Ran Out of Food in the Last Year: Never true  Transportation Needs: No Transportation Needs (07/02/2022)   PRAPARE - Administrator, Civil Service (Medical): No    Lack of  Transportation (Non-Medical): No  Physical Activity: Inactive (07/02/2022)   Exercise Vital Sign    Days of Exercise per Week: 0 days    Minutes of Exercise per Session: 0 min  Stress: No Stress Concern Present (07/02/2022)   [employer] of Occupational Health - Occupational Stress Questionnaire    Feeling of Stress : Only a little  Social Connections: Moderately Integrated (07/02/2022)   Social Connection and Isolation Panel [NHANES]    Frequency of Communication with Friends and Family: Never    Frequency of Social Gatherings with Friends and Family: Once a week    Attends Religious Services: More than 4 times per year    Active Member of [employer] or Organizations: Yes    Attends Engineer, structural: More than 4 times per year    Marital Status: Married  Catering manager Violence: Not At Risk (07/02/2022)   Humiliation, Afraid, Rape, and Kick questionnaire    Fear of Current or Ex-Partner: No    Emotionally Abused: No    Physically Abused: No    Sexually Abused: No    Family History:  Family History  Problem Relation Age of Onset   Heart attack Mother    Heart attack Father    Heart attack Sister    Hypertension Other    Heart disease Other    Diabetes Other    Coronary artery disease Other     Medications:   Current Outpatient Medications on File Prior to Visit  Medication Sig Dispense Refill   acetaminophen (TYLENOL) 325 MG tablet Take 325 mg by mouth at bedtime as needed for mild pain (pain score 1-3) or moderate pain (pain score 4-6).     albuterol (VENTOLIN HFA) 108 (90 Base) MCG/ACT inhaler Inhale 2 puffs into the lungs every 6 (six) hours as needed for wheezing or shortness of breath. 6.7 g 2   amLODipine (NORVASC) 10 MG tablet Take 1 tablet (10 mg total) by mouth daily. 30 tablet 11   atorvastatin (LIPITOR) 40 MG tablet Take 1 tablet (40 mg total) by mouth daily. 90 tablet 3   benzonatate (TESSALON) 100 MG capsule Take 1 capsule (100 mg total) by  mouth 2 (two) times daily as needed for cough. 20 capsule 0   carvedilol (COREG) 6.25 MG tablet Take 1 tablet (6.25 mg total) by mouth 2 (two) times daily with a meal. 180 tablet 3   fluticasone (FLONASE) 50 MCG/ACT nasal spray Place 1 spray into both nostrils daily. 16 g 2   loratadine (CLARITIN) 10 MG tablet Take 10 mg by mouth daily.     omeprazole (PRILOSEC) 20 MG capsule Take 2 capsules (40 mg  total) by mouth daily. 180 capsule 3   ondansetron (ZOFRAN-ODT) 4 MG disintegrating tablet Dissolve 1 tablet (4 mg total) by mouth every 8 (eight) hours as needed for nausea or vomiting. 20 tablet 0   rivaroxaban (XARELTO) 20 MG TABS tablet Take 1 tablet (20 mg total) by mouth daily with supper. 90 tablet 3   Tiotropium Bromide Monohydrate (SPIRIVA RESPIMAT) 2.5 MCG/ACT AERS Inhale 2 puffs into the lungs daily. 4 g 3   Semaglutide,0.25 or 0.5MG /DOS, 2 MG/3ML SOPN Inject 0.25 mg into the skin once a week. (Patient not taking: Reported on 12/02/2023) 3 mL 3   No current facility-administered medications on file prior to visit.    Allergies:   Allergies  Allergen Reactions   Tramadol Nausea And Vomiting   Ciprofloxacin Rash      OBJECTIVE:  Physical Exam  Vitals:   12/02/23 1524  BP: 128/76  Pulse: 81  Weight: 213 lb 3.2 oz (96.7 kg)  Height: 5\' 3"  (1.6 m)   Body mass index is 37.77 kg/m. No results found.  General: well developed, well nourished, very pleasant middle-aged female, seated, in no evident distress Head: head normocephalic and atraumatic.   Neck: supple with no carotid or supraclavicular bruits Cardiovascular: regular rate and rhythm, no murmurs Musculoskeletal: no deformity Skin:  no rash/petichiae Vascular:  Normal pulses all extremities   Neurologic Exam Mental Status: Awake and fully alert. Oriented to place and time. Recent and remote memory intact. Attention span, concentration and fund of knowledge appropriate. Mood and affect appropriate.  Cranial Nerves:  Pupils equal, briskly reactive to light. Extraocular movements full without nystagmus. Visual fields full to confrontation. Hearing intact. Facial sensation intact. Face, tongue, palate moves normally and symmetrically.  Motor: Normal bulk and tone. Normal strength in all tested extremity muscles Gait and Station: Arises from chair without difficulty. Stance is normal. Gait demonstrates normal stride length and balance without use of AD.         ASSESSMENT/PLAN: [name] is a 68 y.o. year old female    OSA on CPAP :  Nocturnal hypoxemia: Excessive daytime sleepiness: Compliance report shows satisfactory usage with optimal residual AHI.   Continue current pressure settings of 7-13.   Recommend completion of ONO while on CPAP to assess for resolution of nocturnal hypoxemia on CPAP therapy Will obtain lab work today to look for other causes of persistent daytime sleepiness Did discuss importance of weight loss, PCP currently working on approval for [employer] or Ozempic. If denied, consider Zepbound which has been approved for those with moderate sleep apnea but will defer to PCP. Discussed continued nightly usage with ensuring greater than 4 hours nightly for optimal benefit and per insurance purposes.   Continue to follow with DME company for any needed supplies or CPAP related concerns     Follow up in 6 months or call earlier if needed   CC:  PCP: Masters, Florentina Addison, DO    I spent 25 minutes of face-to-face and non-face-to-face time with patient.  This included previsit chart review, lab review, study review, order entry, electronic health record documentation, patient education and discussion regarding above diagnoses and treatment plan and answered all other questions to patient's satisfaction  Ihor Austin, [facility]  [facility] [address] [place], [place] [address]  Phone [phone] Fax [phone] Note: This document was  prepared with digital dictation and possible smart phrase technology. Any transcriptional errors that result from this process are unintentional.

## 2023-12-02 NOTE — Patient Instructions (Signed)
 Your Plan:  Continue nightly use of CPAP with greater than 4 hours per night for optimal benefit and per insurance requirements  Continue to follow with your DME company [employer] for any needs supplies or CPAP related concerns  You will be called to set up an overnight oximetry test to ensure your low oxygen levels improved on CPAP therapy - please ensure you use your CPAP for this testing   We will check lab work today to look for other causes of fatigue  Follow up with your PCP regarding weight loss injections - can consider Zepbound which is approved for those with moderate sleep apnea      Follow-up in 6 months or call earlier if needed     Thank you for coming to see us  at [facility]. I hope we have been able to provide you high quality care today.  You may receive a patient satisfaction survey over the next few weeks. We would appreciate your feedback and comments so that we may continue to improve ourselves and the health of our patients.

## 2023-12-02 NOTE — Progress Notes (Signed)
 Acute Office Visit  Subjective:     Patient ID: [name], female    DOB: 10/14/1955, 68 y.o.   MRN: [id number]  Chief Complaint  Patient presents with   Follow-up    Patient is a 68 y.o. with a past medical history stated below who presents today for recent viral illness/allergies. She is accompanied by her young grandson. Please see problem based assessment and plan for additional details.       Review of Systems  Constitutional:  Negative for chills and fever.  HENT:  Negative for congestion and sore throat.   Respiratory:  Negative for cough.   Skin:  Negative for itching and rash.      Objective:    BP (!) 156/72 (BP Location: Right Arm, Patient Position: Sitting, Cuff Size: Small)   Pulse 87   Temp 98 F (36.7 C) (Oral)   Ht 5\' 3"  (1.6 m)   Wt 212 lb 9.6 oz (96.4 kg)   SpO2 100%   BMI 37.66 kg/m    Physical Exam HENT:     Head: Normocephalic and atraumatic.     Nose: Nose normal.     Mouth/Throat:     Mouth: Mucous membranes are moist.  Eyes:     Pupils: Pupils are equal, round, and reactive to light.  Cardiovascular:     Rate and Rhythm: Normal rate and regular rhythm.     Heart sounds: Normal heart sounds.  Pulmonary:     Effort: Pulmonary effort is normal. No respiratory distress.     Breath sounds: Normal breath sounds. No wheezing or rhonchi.  Abdominal:     General: Bowel sounds are normal.     Palpations: Abdomen is soft.  Skin:    General: Skin is warm.  Neurological:     General: No focal deficit present.     Mental Status: She is alert.  Psychiatric:        Mood and Affect: Mood normal.    No results found for any visits on 12/02/23.     Assessment & Plan:   Problem List Items Addressed This Visit     Morbid obesity (HCC) with BMI >35 with HTN, HLD, OSA, GERD - Primary (Chronic)   Patient previously prescribed Mounjaro for OSA/weight loss. This was denied so Ozempic was prescribed. Ozempic was also recently denied.  Patient has not been able to start therapy. Seems like an appeal has been submitted, will continue to wait to see if either therapy is approved.  Plan - Follow up on approva/denial at next appointment  - Patient referred to nutrition services for additional support with healthy habits/lifestyle modification      Relevant Orders   Referral to Nutrition and Diabetes Services   SLEEP APNEA, OBSTRUCTIVE   See note under Morbid Obesity.       Allergic rhinitis (Chronic)   Patient with history of allergic rhinitis. Last week she was experiencing significant cough, sneezing, congestion, red eyes, and laryngitis. Takes flonase and claritin as needed. States her symptoms improved after it rained and the pollen seemed to wash away. Denies any symptoms today. Given history of CAP discussed return precautions should she develop a fever, productive cough, chest pain, or shortness of breath.  Plan - Continue flonase daily  - Continue claritin daily  - Reviewed return precautions      Deep vein thrombosis (DVT) (HCC)   Patient on Xarelto 20 mg daily. Has been taking consistently. Per patient, she received a message  from her pharmacy that she would not be able to pick up her refills, including Xarelto, if she does not pay $587.10. Office staff helped to call pharmacy, clarified she needs to meet her deductible then Xarelto would be  $40 copay thereafter. This is still cost prohibitive to patient. Samples available today, will give to patient to help bridge therapy. Will reach out to [employer] (pharmacy tech) for help with patient assistance application. Will follow up with patient at 5/06 visit.  Plan - Continue Xarelto 20 mg daily  - Samples given today (21 days) - Arma [employer] to reach out to patient with patient assistance application       Pre-diabetes   See note under Morbid Obesity.      Relevant Orders   Referral to Nutrition and Diabetes Services   No orders of the defined types  were placed in this encounter.  Patient discussed with Dr. Lelia Putnam.  Return 3-4 weeks, for Weight loss/medication management.  Tihanna Goodson Arellano Zameza, MD PGY-1, [facility] IMTP

## 2023-12-02 NOTE — Progress Notes (Signed)
 Pharmacy Medication Assistance Program Note    12/30/2023  Patient ID: [name], female   DOB: April 05, 1956, 68 y.o.   MRN: [id number]     12/02/2023  Outreach Medication One  Manufacturer [employer]ication [employer] [facility] Xarelto   Dose of Xarelto  20MG   Type of Assistance Manufacturer Assistance  Date Application Sent to Patient 12/03/2023  Application Items Requested Proof of Income  Date Application Sent to Prescriber 12/24/2023  Name of Prescriber Jackolyn Masker  Date Application Received From Patient 12/23/2023  Application Items Received From Patient Application;Proof of Income  Date Application Submitted to Manufacturer 12/24/2023  Method Application Sent to Manufacturer Fax  Patient Assistance Determination Approved  Approval Start Date 12/29/2023  Approval End Date 12/28/2024     JJPAF - APPROVED

## 2023-12-02 NOTE — Assessment & Plan Note (Signed)
 Patient with history of allergic rhinitis. Last week she was experiencing significant cough, sneezing, congestion, red eyes, and laryngitis. Takes flonase and claritin as needed. States her symptoms improved after it rained and the pollen seemed to wash away. Denies any symptoms today. Given history of CAP discussed return precautions should she develop a fever, productive cough, chest pain, or shortness of breath.  Plan - Continue flonase daily  - Continue claritin daily  - Reviewed return precautions

## 2023-12-03 ENCOUNTER — Other Ambulatory Visit (HOSPITAL_COMMUNITY): Payer: Self-pay

## 2023-12-03 LAB — THYROID PANEL WITH TSH
Free Thyroxine Index: 2.2 (ref 1.2–4.9)
T3 Uptake Ratio: 23 % — ABNORMAL LOW (ref 24–39)
T4, Total: 9.7 ug/dL (ref 4.5–12.0)
TSH: 0.641 u[IU]/mL (ref 0.450–4.500)

## 2023-12-03 LAB — VITAMIN B12: Vitamin B-12: 1175 pg/mL (ref 232–1245)

## 2023-12-03 LAB — IRON,TIBC AND FERRITIN PANEL
Ferritin: 254 ng/mL — ABNORMAL HIGH (ref 15–150)
Iron Saturation: 25 % (ref 15–55)
Iron: 51 ug/dL (ref 27–139)
Total Iron Binding Capacity: 208 ug/dL — ABNORMAL LOW (ref 250–450)
UIBC: 157 ug/dL (ref 118–369)

## 2023-12-03 LAB — VITAMIN D 25 HYDROXY (VIT D DEFICIENCY, FRACTURES): Vit D, 25-Hydroxy: 31.5 ng/mL (ref 30.0–100.0)

## 2023-12-03 MED ORDER — ZEPBOUND 2.5 MG/0.5ML ~~LOC~~ SOAJ
2.5000 mg | SUBCUTANEOUS | 3 refills | Status: DC
  Filled 2023-12-03 – 2023-12-09 (×4): qty 2, 28d supply, fill #0
  Filled 2023-12-23 (×2): qty 4, 56d supply, fill #0

## 2023-12-03 NOTE — Progress Notes (Signed)
 Kiyani Jernigan D, [name]  Zott, Loyce Ruffini; Gamble, Tammy New orders have been placed for the above pt, DOB: 06/06/1956 Thanks

## 2023-12-03 NOTE — Addendum Note (Signed)
 Addended by: Bishop Bullock on: 12/03/2023 08:40 AM   Modules accepted: Orders

## 2023-12-04 ENCOUNTER — Telehealth: Payer: Self-pay

## 2023-12-04 NOTE — Telephone Encounter (Signed)
 Prior Authorization for patient (Zepbound 2.5MG /0.5ML pen-injectors) came through on cover my meds was submitted with last office notes awaiting approval or denial.  [id number]

## 2023-12-04 NOTE — Telephone Encounter (Addendum)
 [name] ([name]) Rx #: [id number] Zepbound 2.5MG /0.5ML pen-injectors Form [employer] Electronic PA Form Created Sent to Plan Plan Response Submit Clinical Questions Determination Favorable Message from Plan PA Case: [id number], Status: Approved, Coverage Starts on: 08/20/2023 12:00:00 AM, Coverage Ends on: 08/18/2024 12:00:00 AM. Questions? Contact [phone].Aaron Aas Authorization Expiration Date: August 18, 2024.  Patient is aware

## 2023-12-09 ENCOUNTER — Telehealth: Payer: Self-pay | Admitting: *Deleted

## 2023-12-09 ENCOUNTER — Other Ambulatory Visit (HOSPITAL_COMMUNITY): Payer: Self-pay

## 2023-12-10 ENCOUNTER — Other Ambulatory Visit (HOSPITAL_COMMUNITY): Payer: Self-pay

## 2023-12-16 ENCOUNTER — Emergency Department (HOSPITAL_BASED_OUTPATIENT_CLINIC_OR_DEPARTMENT_OTHER): Admitting: Radiology

## 2023-12-16 ENCOUNTER — Other Ambulatory Visit: Payer: Self-pay

## 2023-12-16 ENCOUNTER — Encounter (HOSPITAL_BASED_OUTPATIENT_CLINIC_OR_DEPARTMENT_OTHER): Payer: Self-pay | Admitting: Emergency Medicine

## 2023-12-16 ENCOUNTER — Emergency Department (HOSPITAL_BASED_OUTPATIENT_CLINIC_OR_DEPARTMENT_OTHER)
Admission: EM | Admit: 2023-12-16 | Discharge: 2023-12-17 | Disposition: A | Discharge status: Home / Self Care | Attending: Emergency Medicine | Admitting: Emergency Medicine

## 2023-12-16 DIAGNOSIS — I110 Hypertensive heart disease with heart failure: Secondary | ICD-10-CM | POA: Insufficient documentation

## 2023-12-16 DIAGNOSIS — Z79899 Other long term (current) drug therapy: Secondary | ICD-10-CM | POA: Diagnosis not present

## 2023-12-16 DIAGNOSIS — R059 Cough, unspecified: Secondary | ICD-10-CM | POA: Diagnosis not present

## 2023-12-16 DIAGNOSIS — Z7901 Long term (current) use of anticoagulants: Secondary | ICD-10-CM | POA: Insufficient documentation

## 2023-12-16 DIAGNOSIS — J449 Chronic obstructive pulmonary disease, unspecified: Secondary | ICD-10-CM | POA: Diagnosis not present

## 2023-12-16 DIAGNOSIS — Z86711 Personal history of pulmonary embolism: Secondary | ICD-10-CM | POA: Diagnosis not present

## 2023-12-16 DIAGNOSIS — Z7951 Long term (current) use of inhaled steroids: Secondary | ICD-10-CM | POA: Diagnosis not present

## 2023-12-16 DIAGNOSIS — J441 Chronic obstructive pulmonary disease with (acute) exacerbation: Secondary | ICD-10-CM | POA: Insufficient documentation

## 2023-12-16 DIAGNOSIS — R062 Wheezing: Secondary | ICD-10-CM | POA: Diagnosis not present

## 2023-12-16 DIAGNOSIS — I509 Heart failure, unspecified: Secondary | ICD-10-CM | POA: Insufficient documentation

## 2023-12-16 DIAGNOSIS — R079 Chest pain, unspecified: Secondary | ICD-10-CM | POA: Diagnosis not present

## 2023-12-16 DIAGNOSIS — R0602 Shortness of breath: Secondary | ICD-10-CM | POA: Diagnosis not present

## 2023-12-16 LAB — RESP PANEL BY RT-PCR (RSV, FLU A&B, COVID)  RVPGX2
Influenza A by PCR: NEGATIVE
Influenza B by PCR: NEGATIVE
Resp Syncytial Virus by PCR: NEGATIVE
SARS Coronavirus 2 by RT PCR: NEGATIVE

## 2023-12-16 LAB — CBC WITH DIFFERENTIAL/PLATELET
Abs Immature Granulocytes: 0.03 10*3/uL (ref 0.00–0.07)
Basophils Absolute: 0 10*3/uL (ref 0.0–0.1)
Basophils Relative: 0 %
Eosinophils Absolute: 0.3 10*3/uL (ref 0.0–0.5)
Eosinophils Relative: 4 %
HCT: 40.3 % (ref 36.0–46.0)
Hemoglobin: 13.2 g/dL (ref 12.0–15.0)
Immature Granulocytes: 0 %
Lymphocytes Relative: 16 %
Lymphs Abs: 1.5 10*3/uL (ref 0.7–4.0)
MCH: 26.9 pg (ref 26.0–34.0)
MCHC: 32.8 g/dL (ref 30.0–36.0)
MCV: 82.2 fL (ref 80.0–100.0)
Monocytes Absolute: 1 10*3/uL (ref 0.1–1.0)
Monocytes Relative: 10 %
Neutro Abs: 6.5 10*3/uL (ref 1.7–7.7)
Neutrophils Relative %: 70 %
Platelets: 247 10*3/uL (ref 150–400)
RBC: 4.9 MIL/uL (ref 3.87–5.11)
RDW: 14.1 % (ref 11.5–15.5)
WBC: 9.3 10*3/uL (ref 4.0–10.5)
nRBC: 0 % (ref 0.0–0.2)

## 2023-12-16 LAB — TROPONIN T, HIGH SENSITIVITY: Troponin T High Sensitivity: <15 ng/L (ref <19)

## 2023-12-16 LAB — BASIC METABOLIC PANEL WITH GFR
Anion gap: 12 (ref 5–15)
BUN: 10 mg/dL (ref 8–23)
CO2: 25 mmol/L (ref 22–32)
Calcium: 9.8 mg/dL (ref 8.9–10.3)
Chloride: 105 mmol/L (ref 98–111)
Creatinine, Ser: 1.07 mg/dL — ABNORMAL HIGH (ref 0.44–1.00)
GFR, Estimated: 57 mL/min — ABNORMAL LOW (ref >60)
Glucose, Bld: 107 mg/dL — ABNORMAL HIGH (ref 70–99)
Potassium: 3.2 mmol/L — ABNORMAL LOW (ref 3.5–5.1)
Sodium: 141 mmol/L (ref 135–145)

## 2023-12-16 LAB — D-DIMER, QUANTITATIVE: D-Dimer, Quant: <0.27 ug{FEU}/mL (ref 0.00–0.50)

## 2023-12-16 LAB — PRO BRAIN NATRIURETIC PEPTIDE: Pro Brain Natriuretic Peptide: 169 pg/mL (ref <300.0)

## 2023-12-16 MED ORDER — POTASSIUM CHLORIDE CRYS ER 20 MEQ PO TBCR
40.0000 meq | EXTENDED_RELEASE_TABLET | Freq: Once | ORAL | Status: AC
  Administered 2023-12-16: 40 meq via ORAL
  Filled 2023-12-16: qty 2

## 2023-12-16 MED ORDER — IPRATROPIUM-ALBUTEROL 0.5-2.5 (3) MG/3ML IN SOLN
3.0000 mL | Freq: Once | RESPIRATORY_TRACT | Status: AC
  Administered 2023-12-16: 3 mL via RESPIRATORY_TRACT
  Filled 2023-12-16: qty 3

## 2023-12-16 MED ORDER — AMOXICILLIN-POT CLAVULANATE 875-125 MG PO TABS
1.0000 | ORAL_TABLET | Freq: Two times a day (BID) | ORAL | 0 refills | Status: DC
  Filled 2023-12-16: qty 14, 7d supply, fill #0

## 2023-12-16 MED ORDER — SODIUM CHLORIDE 0.9 % IV SOLN
1.0000 g | Freq: Once | INTRAVENOUS | Status: AC
  Administered 2023-12-16: 1 g via INTRAVENOUS
  Filled 2023-12-16: qty 10

## 2023-12-16 MED ORDER — ALBUTEROL SULFATE (2.5 MG/3ML) 0.083% IN NEBU
2.5000 mg | INHALATION_SOLUTION | Freq: Once | RESPIRATORY_TRACT | Status: AC
  Administered 2023-12-16: 2.5 mg via RESPIRATORY_TRACT
  Filled 2023-12-16: qty 3

## 2023-12-16 MED ORDER — METHYLPREDNISOLONE SODIUM SUCC 125 MG IJ SOLR
125.0000 mg | Freq: Once | INTRAMUSCULAR | Status: AC
  Administered 2023-12-16: 125 mg via INTRAVENOUS
  Filled 2023-12-16: qty 2

## 2023-12-16 MED ORDER — SODIUM CHLORIDE 0.9 % IV SOLN
500.0000 mg | Freq: Once | INTRAVENOUS | Status: AC
  Administered 2023-12-16: 500 mg via INTRAVENOUS
  Filled 2023-12-16: qty 5

## 2023-12-16 MED ORDER — PREDNISONE 20 MG PO TABS
40.0000 mg | ORAL_TABLET | Freq: Every day | ORAL | 0 refills | Status: DC
  Filled 2023-12-16: qty 10, 5d supply, fill #0

## 2023-12-16 NOTE — ED Notes (Signed)
 Resp at bedside

## 2023-12-16 NOTE — ED Triage Notes (Addendum)
 Cough non productive ,sob Started yesterday Chest pain when coughing Bilateral exp wheezing

## 2023-12-16 NOTE — ED Notes (Signed)
   12/16/23 2200  Resting  Supplemental oxygen during test? No  Resting Heart Rate 82  Resting Sp02 94  Lap 1 (250 feet)  HR 95  02 Sat 93  Lap 2 (250 feet)  HR 87  02 Sat 94  Lap 3 (250 feet)  HR 93  02 Sat 94  Tech Comments:  (Increased WOB noted following ambulation. Patient performing pursed lip breathing while ambulating and at rest following ambulation. EDP notified.)

## 2023-12-16 NOTE — ED Provider Notes (Incomplete)
 [place] EMERGENCY DEPARTMENT AT [facility] Provider Note   CSN: [id number] Arrival date & time: 12/16/23  2014     History {Add pertinent medical, surgical, social history, OB history to HPI:1} Chief Complaint  Patient presents with  . Shortness of Breath    [name] is a 68 y.o. female with PMHx COPD, CHF, HLD, HTN, OSA, prior PE on Xarelto  who presents to ED concerned for non productive cough and SOB since yesterday. Denies sick contacts. Denies LE swelling. Does endorse some chest pain only when coughing. Denies fever, nausea, vomiting.   Patient with follow up appt with PCP in 5 days.   Shortness of Breath      Home Medications Prior to Admission medications   Medication Sig Start Date End Date Taking? Authorizing Provider  acetaminophen  (TYLENOL ) 325 MG tablet Take 325 mg by mouth at bedtime as needed for mild pain (pain score 1-3) or moderate pain (pain score 4-6).    [provider]  albuterol  (VENTOLIN  HFA) 108 (90 Base) MCG/ACT inhaler Inhale 2 puffs into the lungs every 6 (six) hours as needed for wheezing or shortness of breath. 02/11/23   Allana Arabia, MD  amLODipine  (NORVASC ) 10 MG tablet Take 1 tablet (10 mg total) by mouth daily. 07/16/23 07/15/24  Cathey Clunes, MD  atorvastatin  (LIPITOR) 40 MG tablet Take 1 tablet (40 mg total) by mouth daily. 08/04/23   Masters, Katie, DO  benzonatate  (TESSALON ) 100 MG capsule Take 1 capsule (100 mg total) by mouth 2 (two) times daily as needed for cough. 10/24/23   Sherel Dikes, PA-C  carvedilol  (COREG ) 6.25 MG tablet Take 1 tablet (6.25 mg total) by mouth 2 (two) times daily with a meal. 08/04/23   Masters, Katie, DO  fluticasone  (FLONASE ) 50 MCG/ACT nasal spray Place 1 spray into both nostrils daily. 10/24/21   Toy Freund, MD  loratadine  (CLARITIN ) 10 MG tablet Take 10 mg by mouth daily.    [provider]  omeprazole  (PRILOSEC) 20 MG capsule Take 2 capsules (40 mg total)  by mouth daily. 07/21/23   Masters, Alston Jerry, DO  ondansetron  (ZOFRAN -ODT) 4 MG disintegrating tablet Dissolve 1 tablet (4 mg total) by mouth every 8 (eight) hours as needed for nausea or vomiting. 09/17/23   Sheree Dieter, MD  rivaroxaban  (XARELTO ) 20 MG TABS tablet Take 1 tablet (20 mg total) by mouth daily with supper. 08/04/23 08/03/24  Masters, Katie, DO  Tiotropium Bromide  Monohydrate (SPIRIVA  RESPIMAT) 2.5 MCG/ACT AERS Inhale 2 puffs into the lungs daily. 06/25/23   Masters, Katie, DO  tirzepatide  (ZEPBOUND ) 2.5 MG/0.5ML Pen Inject 2.5 mg into the skin once a week. 12/03/23   Masters, Alston Jerry, DO      Allergies    Tramadol  and Ciprofloxacin    Review of Systems   Review of Systems  Respiratory:  Positive for shortness of breath.     Physical Exam Updated Vital Signs BP (!) 140/73   Pulse 81   Temp 99.3 F (37.4 C) (Oral)   Resp 14   Ht 5\' 3"  (1.6 m)   Wt 96.6 kg   SpO2 100%   BMI 37.73 kg/m  Physical Exam Vitals and nursing note reviewed.  Constitutional:      General: She is not in acute distress.    Appearance: She is not ill-appearing or toxic-appearing.  HENT:     Head: Normocephalic and atraumatic.     Mouth/Throat:     Mouth: Mucous membranes are moist.     Pharynx:  No posterior oropharyngeal erythema.  Eyes:     General: No scleral icterus.       Right eye: No discharge.        Left eye: No discharge.     Conjunctiva/sclera: Conjunctivae normal.  Cardiovascular:     Rate and Rhythm: Normal rate and regular rhythm.     Pulses: Normal pulses.     Heart sounds: Normal heart sounds. No murmur heard. Pulmonary:     Effort: Pulmonary effort is normal. No respiratory distress.     Breath sounds: Wheezing present. No rhonchi or rales.     Comments: Expiratory wheezing BL Abdominal:     Tenderness: There is no abdominal tenderness.  Musculoskeletal:     Right lower leg: No edema.     Left lower leg: No edema.  Skin:    General: Skin is warm and dry.      Findings: No rash.  Neurological:     General: No focal deficit present.     Mental Status: She is alert and oriented to person, place, and time. Mental status is at baseline.  Psychiatric:        Mood and Affect: Mood normal.        Behavior: Behavior normal.     ED Results / Procedures / Treatments   Labs (all labs ordered are listed, but only abnormal results are displayed) Labs Reviewed  BASIC METABOLIC PANEL WITH GFR - Abnormal; Notable for the following components:      Result Value   Potassium 3.2 (*)    Glucose, Bld 107 (*)    Creatinine, Ser 1.07 (*)    GFR, Estimated 57 (*)    All other components within normal limits  RESP PANEL BY RT-PCR (RSV, FLU A&B, COVID)  RVPGX2  CBC WITH DIFFERENTIAL/PLATELET  D-DIMER, QUANTITATIVE  PRO BRAIN NATRIURETIC PEPTIDE    EKG None  Radiology No results found.  Procedures Procedures  {Document cardiac monitor, telemetry assessment procedure when appropriate:1}  Medications Ordered in ED Medications  albuterol  (PROVENTIL ) (2.5 MG/3ML) 0.083% nebulizer solution 2.5 mg (2.5 mg Nebulization Given 12/16/23 2028)  ipratropium-albuterol  (DUONEB) 0.5-2.5 (3) MG/3ML nebulizer solution 3 mL (3 mLs Nebulization Given 12/16/23 2031)    ED Course/ Medical Decision Making/ A&P   {   Click here for ABCD2, HEART and other calculatorsREFRESH Note before signing :1}                              Medical Decision Making Amount and/or Complexity of Data Reviewed Labs: ordered. Radiology: ordered.  Risk Prescription drug management.   This patient presents to the ED for concern of shortness of breath, this involves an extensive number of treatment options, and is a complaint that carries with it a high risk of complications and morbidity.  The differential diagnosis includes Anxiety, Anaphylaxis/Angioedema, Aspirated FB, Arrhythmia, CHF, Asthma, COPD, PNA, COVID/Flu/RSV, STEMI, Tamponade, TPNX, Sepsis   Co morbidities that complicate the  patient evaluation  COPD, CHF, HLD, HTN, OSA, prior PE on Xarelto    Additional history obtained:  Dr. Jannice Mends PCP   Problem List / ED Course / Critical interventions / Medication management  Patient presents to ED concern for dry cough and SOB progressing since yesterday.  Patient denies any other infectious symptoms.  Does endorse some chest pain only with coughing. Physical exam with BL expiratory wheezing.  Patient ambulated with O2 >93% on RA.  Rest of physical exam reassuring.  Patient afebrile with stable vitals. I Ordered, and personally interpreted labs.  *** The patient was maintained on a cardiac monitor.  I personally viewed and interpreted the cardiac monitored which showed an underlying rhythm of: *** I ordered imaging studies including chest xray to assess for process contributing to patient's symptoms. I independently visualized and interpreted imaging which showed ***. I agree with the radiologist interpretation I requested consultation with the ***,  and discussed lab and imaging findings as well as pertinent plan - they recommend: *** I have reviewed the patients home medicines and have made adjustments as needed The patient has been appropriately medically screened and/or stabilized in the ED. I have low suspicion for any other emergent medical condition which would require further screening, evaluation or treatment in the ED or require inpatient management. At time of discharge the patient is hemodynamically stable and in no acute distress. I have discussed work-up results and diagnosis with patient and answered all questions. Patient is agreeable with discharge plan. We discussed strict return precautions for returning to the emergency department and they verbalized understanding.     Social Determinants of Health:  none    {Document critical care time when appropriate:1} {Document review of labs and clinical decision tools ie heart score, Chads2Vasc2 etc:1}   {Document your independent review of radiology images, and any outside records:1} {Document your discussion with family members, caretakers, and with consultants:1} {Document social determinants of health affecting pt's care:1} {Document your decision making why or why not admission, treatments were needed:1} Final Clinical Impression(s) / ED Diagnoses Final diagnoses:  None    Rx / DC Orders ED Discharge Orders     None

## 2023-12-16 NOTE — Discharge Instructions (Addendum)
 It was a pleasure caring for you today. Please follow up with your primary care provider. Seek emergency care if experiencing any new or worsening symptoms.

## 2023-12-16 NOTE — ED Provider Notes (Signed)
 [place] EMERGENCY DEPARTMENT AT [facility] Provider Note   CSN: [id number] Arrival date & time: 12/16/23  2014     History {Add pertinent medical, surgical, social history, OB history to HPI:1} Chief Complaint  Patient presents with   Shortness of Breath    [name] is a 68 y.o. female. Non productive cough and SOB since yesterday.    Shortness of Breath      Home Medications Prior to Admission medications   Medication Sig Start Date End Date Taking? Authorizing Provider  acetaminophen  (TYLENOL ) 325 MG tablet Take 325 mg by mouth at bedtime as needed for mild pain (pain score 1-3) or moderate pain (pain score 4-6).    [provider]  albuterol  (VENTOLIN  HFA) 108 (90 Base) MCG/ACT inhaler Inhale 2 puffs into the lungs every 6 (six) hours as needed for wheezing or shortness of breath. 02/11/23   Allana Arabia, MD  amLODipine  (NORVASC ) 10 MG tablet Take 1 tablet (10 mg total) by mouth daily. 07/16/23 07/15/24  Cathey Clunes, MD  atorvastatin  (LIPITOR) 40 MG tablet Take 1 tablet (40 mg total) by mouth daily. 08/04/23   Masters, Katie, DO  benzonatate  (TESSALON ) 100 MG capsule Take 1 capsule (100 mg total) by mouth 2 (two) times daily as needed for cough. 10/24/23   Sherel Dikes, PA-C  carvedilol  (COREG ) 6.25 MG tablet Take 1 tablet (6.25 mg total) by mouth 2 (two) times daily with a meal. 08/04/23   Masters, Katie, DO  fluticasone  (FLONASE ) 50 MCG/ACT nasal spray Place 1 spray into both nostrils daily. 10/24/21   Toy Freund, MD  loratadine  (CLARITIN ) 10 MG tablet Take 10 mg by mouth daily.    [provider]  omeprazole  (PRILOSEC) 20 MG capsule Take 2 capsules (40 mg total) by mouth daily. 07/21/23   Masters, Alston Jerry, DO  ondansetron  (ZOFRAN -ODT) 4 MG disintegrating tablet Dissolve 1 tablet (4 mg total) by mouth every 8 (eight) hours as needed for nausea or vomiting. 09/17/23   Sheree Dieter, MD  rivaroxaban  (XARELTO ) 20 MG TABS  tablet Take 1 tablet (20 mg total) by mouth daily with supper. 08/04/23 08/03/24  Masters, Katie, DO  Tiotropium Bromide  Monohydrate (SPIRIVA  RESPIMAT) 2.5 MCG/ACT AERS Inhale 2 puffs into the lungs daily. 06/25/23   Masters, Katie, DO  tirzepatide  (ZEPBOUND ) 2.5 MG/0.5ML Pen Inject 2.5 mg into the skin once a week. 12/03/23   Masters, Katie, DO      Allergies    Tramadol  and Ciprofloxacin    Review of Systems   Review of Systems  Respiratory:  Positive for shortness of breath.     Physical Exam Updated Vital Signs BP (!) 140/73   Pulse 81   Temp 99.3 F (37.4 C) (Oral)   Resp 14   Ht 5\' 3"  (1.6 m)   Wt 96.6 kg   SpO2 100%   BMI 37.73 kg/m  Physical Exam  ED Results / Procedures / Treatments   Labs (all labs ordered are listed, but only abnormal results are displayed) Labs Reviewed  BASIC METABOLIC PANEL WITH GFR - Abnormal; Notable for the following components:      Result Value   Potassium 3.2 (*)    Glucose, Bld 107 (*)    Creatinine, Ser 1.07 (*)    GFR, Estimated 57 (*)    All other components within normal limits  RESP PANEL BY RT-PCR (RSV, FLU A&B, COVID)  RVPGX2  CBC WITH DIFFERENTIAL/PLATELET  D-DIMER, QUANTITATIVE  PRO BRAIN NATRIURETIC PEPTIDE  EKG None  Radiology No results found.  Procedures Procedures  {Document cardiac monitor, telemetry assessment procedure when appropriate:1}  Medications Ordered in ED Medications  albuterol  (PROVENTIL ) (2.5 MG/3ML) 0.083% nebulizer solution 2.5 mg (2.5 mg Nebulization Given 12/16/23 2028)  ipratropium-albuterol  (DUONEB) 0.5-2.5 (3) MG/3ML nebulizer solution 3 mL (3 mLs Nebulization Given 12/16/23 2031)    ED Course/ Medical Decision Making/ A&P   {   Click here for ABCD2, HEART and other calculatorsREFRESH Note before signing :1}                              Medical Decision Making Amount and/or Complexity of Data Reviewed Labs: ordered. Radiology: ordered.  Risk Prescription drug  management.   ***  {Document critical care time when appropriate:1} {Document review of labs and clinical decision tools ie heart score, Chads2Vasc2 etc:1}  {Document your independent review of radiology images, and any outside records:1} {Document your discussion with family members, caretakers, and with consultants:1} {Document social determinants of health affecting pt's care:1} {Document your decision making why or why not admission, treatments were needed:1} Final Clinical Impression(s) / ED Diagnoses Final diagnoses:  None    Rx / DC Orders ED Discharge Orders     None

## 2023-12-17 ENCOUNTER — Other Ambulatory Visit (HOSPITAL_COMMUNITY): Payer: Self-pay

## 2023-12-17 NOTE — ED Notes (Signed)
 Pt ambulated to and from restroom without assistance. Respirations even and unlabored

## 2023-12-17 NOTE — ED Notes (Signed)
 RN reviewed discharge instructions with pt. Pt verbalized understanding and had no further questions. VSS upon discharge.

## 2023-12-18 ENCOUNTER — Telehealth: Payer: Self-pay

## 2023-12-18 ENCOUNTER — Other Ambulatory Visit (HOSPITAL_COMMUNITY): Payer: Self-pay

## 2023-12-18 NOTE — Transitions of Care (Post Inpatient/ED Visit) (Signed)
 12/18/2023  Name: [name] MRN: [id number] DOB: [birth year]  Today's TOC FU Call Status: Today's TOC FU Call Status:: Successful TOC FU Call Completed TOC FU Call Complete Date: 12/18/23 Patient's Name and Date of Birth confirmed.  Transition Care Management Follow-up Telephone Call Date of Discharge: 12/17/23 Discharge Facility: [facility] ([facility]) Type of Discharge: Emergency Department Reason for ED Visit: Other:, Respiratory Respiratory Diagnosis: COPD Exacerbation How have you been since you were released from the hospital?: Better Any questions or concerns?: No  Items Reviewed: Did you receive and understand the discharge instructions provided?: Yes Medications obtained,verified, and reconciled?: Yes (Medications Reviewed) Any new allergies since your discharge?: No Dietary orders reviewed?: Yes Do you have support at home?: Yes People in Home [RPT]: friend(s), grandchild(ren)  Medications Reviewed Today: Medications Reviewed Today     Reviewed by Darrall Ellison, LPN (Licensed Practical Nurse) on 12/18/23 at 1221  Med List Status: <None>   Medication Order Taking? Sig Documenting Provider Last Dose Status Informant  acetaminophen  (TYLENOL ) 325 MG tablet [id number] No Take 325 mg by mouth at bedtime as needed for mild pain (pain score 1-3) or moderate pain (pain score 4-6). [provider] Taking Active Self, Pharmacy Records  albuterol  (VENTOLIN  HFA) 108 [phone] Base) MCG/ACT inhaler [id number] No Inhale 2 puffs into the lungs every 6 (six) hours as needed for wheezing or shortness of breath. Allana Arabia, MD Taking Active Self, Pharmacy Records  amLODipine  (NORVASC ) 10 MG tablet [id number] No Take 1 tablet (10 mg total) by mouth daily. Gomez-Caraballo, Maria, MD Taking Active   amoxicillin -clavulanate (AUGMENTIN ) 875-125 MG tablet [id number]  Take 1 tablet by mouth every 12 (twelve) hours for 7 days. Dorisann Garre F, [place]  Active   atorvastatin   (LIPITOR) 40 MG tablet [id number] No Take 1 tablet (40 mg total) by mouth daily. Masters, Alston Jerry, DO Taking Active   benzonatate  (TESSALON ) 100 MG capsule [id number] No Take 1 capsule (100 mg total) by mouth 2 (two) times daily as needed for cough. Sherel Dikes, PA-C Taking Active   carvedilol  (COREG ) 6.25 MG tablet [id number] No Take 1 tablet (6.25 mg total) by mouth 2 (two) times daily with a meal. Masters, Alston Jerry, DO Taking Active   fluticasone  (FLONASE ) 50 MCG/ACT nasal spray [id number] No Place 1 spray into both nostrils daily. Toy Freund, MD Taking Active Self, Pharmacy Records  loratadine  (CLARITIN ) 10 MG tablet [id number] No Take 10 mg by mouth daily. [provider] Taking Active Self, Pharmacy Records  omeprazole  (PRILOSEC) 20 MG capsule [id number] No Take 2 capsules (40 mg total) by mouth daily. Masters, Alston Jerry, DO Taking Active   ondansetron  (ZOFRAN -ODT) 4 MG disintegrating tablet [id number] No Dissolve 1 tablet (4 mg total) by mouth every 8 (eight) hours as needed for nausea or vomiting. Sheree Dieter, MD Taking Active   predniSONE  (DELTASONE ) 20 MG tablet [id number]  Take 2 tablets (40 mg total) by mouth daily. Dorisann Garre F, PA-C  Active   rivaroxaban  (XARELTO ) 20 MG TABS tablet [id number] No Take 1 tablet (20 mg total) by mouth daily with supper. Masters, Alston Jerry, DO Taking Active   Tiotropium Bromide  Monohydrate (SPIRIVA  RESPIMAT) 2.5 MCG/ACT AERS [id number] No Inhale 2 puffs into the lungs daily. Masters, Alston Jerry, DO Taking Active   tirzepatide  (ZEPBOUND ) 2.5 MG/0.5ML Pen [id number]  Inject 2.5 mg into the skin once a week. Masters, Alston Jerry, DO  Active             Home Care and Equipment/Supplies: Were Home Health Services Ordered?: NA Any new  equipment or medical supplies ordered?: NA  Functional Questionnaire: Do you need assistance with bathing/showering or dressing?: No Do you need assistance with meal preparation?: No Do you need assistance with eating?:  No Do you have difficulty maintaining continence: No Do you need assistance with getting out of bed/getting out of a chair/moving?: No Do you have difficulty managing or taking your medications?: No  Follow up appointments reviewed: PCP Follow-up appointment confirmed?: Yes Date of PCP follow-up appointment?: 12/23/23 Follow-up Provider: [facility] Follow-up appointment confirmed?: NA Do you need transportation to your follow-up appointment?: No Do you understand care options if your condition(s) worsen?: Yes-patient verbalized understanding    SIGNATURE Darrall Ellison, LPN [facility] Nurse Health Advisor Direct Dial [phone]

## 2023-12-22 ENCOUNTER — Other Ambulatory Visit (HOSPITAL_COMMUNITY): Payer: Self-pay

## 2023-12-23 ENCOUNTER — Encounter: Payer: Self-pay | Admitting: Internal Medicine

## 2023-12-23 ENCOUNTER — Ambulatory Visit (INDEPENDENT_AMBULATORY_CARE_PROVIDER_SITE_OTHER): Admitting: Internal Medicine

## 2023-12-23 ENCOUNTER — Other Ambulatory Visit (HOSPITAL_COMMUNITY): Payer: Self-pay

## 2023-12-23 VITALS — BP 157/90 | HR 70 | Temp 98.2°F | Wt 215.7 lb

## 2023-12-23 DIAGNOSIS — J449 Chronic obstructive pulmonary disease, unspecified: Secondary | ICD-10-CM | POA: Diagnosis not present

## 2023-12-23 DIAGNOSIS — J439 Emphysema, unspecified: Secondary | ICD-10-CM

## 2023-12-23 DIAGNOSIS — R7303 Prediabetes: Secondary | ICD-10-CM

## 2023-12-23 DIAGNOSIS — Z86711 Personal history of pulmonary embolism: Secondary | ICD-10-CM | POA: Diagnosis not present

## 2023-12-23 DIAGNOSIS — I10 Essential (primary) hypertension: Secondary | ICD-10-CM | POA: Diagnosis not present

## 2023-12-23 DIAGNOSIS — Z13820 Encounter for screening for osteoporosis: Secondary | ICD-10-CM

## 2023-12-23 DIAGNOSIS — Z0000 Encounter for general adult medical examination without abnormal findings: Secondary | ICD-10-CM

## 2023-12-23 NOTE — Assessment & Plan Note (Signed)
 Pt was prescribed Mounjaro  but states the co-payment is around 500 for this. She states this is too much for her to afford. Will work with Aron Lard to see if we can find better alternative for her.   Addendum: Discussed with Aron Lard and this will be her co-payment with each refill. Better alternative for her would be Ozempic  as that has assistance program. Will send in Ozempic .

## 2023-12-23 NOTE — Patient Instructions (Addendum)
 [name], it was a pleasure seeing you today! You endorsed feeling well today. Below are some of the things we talked about this visit. We look forward to seeing you in the follow up appointment!  Today we discussed: For your blood pressure, please monitor this at home.   We will continue to work on getting you xarelto  and ozempic .   Use spiriva  every day instead of as needed.   I have ordered the following labs today:  Lab Orders  No laboratory test(s) ordered today      Referrals ordered today:   Referral Orders  No referral(s) requested today     I have ordered the following medication/changed the following medications:   Stop the following medications: Medications Discontinued During This Encounter  Medication Reason   benzonatate  (TESSALON ) 100 MG capsule Patient has not taken in last 30 days   ondansetron  (ZOFRAN -ODT) 4 MG disintegrating tablet Patient has not taken in last 30 days     Start the following medications: No orders of the defined types were placed in this encounter.    Follow-up: 2 week follow up   Please make sure to arrive 15 minutes prior to your next appointment. If you arrive late, you may be asked to reschedule.   We look forward to seeing you next time. Please call our clinic at [phone] if you have any questions or concerns. The best time to call is Monday-Friday from 9am-4pm, but there is someone available 24/7. If after hours or the weekend, call the main hospital number and ask for the Internal Medicine Resident On-Call. If you need medication refills, please notify your pharmacy one week in advance and they will send us  a request.  Thank you for letting us  take part in your care. Wishing you the best!  Thank you, Jackolyn Masker, MD

## 2023-12-23 NOTE — Progress Notes (Unsigned)
 CC: 3 week follow up/ED follow up  HPI:  Ms.Ginia L Carey is a 68 y.o. with medical history of HTN, HLD, Prediabetes, COPD, DVT and PE, ;and OSA presenting to [facility] for 3 week follow up from prior visit along with ED follow up for COPD exacerbation. She presented 3 weeks ago and was instructed to follow up to check the status of her Xarelto  and Moujaro which patient was given samples for.   Please see problem-based list for further details, assessments, and plans.  Past Medical History:  Diagnosis Date   (HFpEF) heart failure with preserved ejection fraction (HCC)    Abscess 02/12/2023   Acute kidney injury (HCC)    Acute viral syndrome 09/18/2023   Atypical chest pain 06/23/2022   Bronchitis due to COVID-19 virus 03/23/2019   DVT (deep venous thrombosis) (HCC)    Encounter for screening involving social determinants of health (SDoH) 09/06/2020   Hyperlipidemia    Hypertension    Leg pain    right   Obesity (BMI 30-39.9)    OSA (obstructive sleep apnea)    Pain and swelling of left forearm 10/02/2022   PE (pulmonary embolism)    Prediabetes    Sleep apnea    Trigger finger, left ring finger 01/04/2022   Upper airway cough syndrome 04/20/2019    Current Outpatient Medications (Endocrine & Metabolic):    Semaglutide ,0.25 or 0.5MG /DOS, 2 MG/3ML SOPN, Inject 0.25 mg into the skin once a week.  Current Outpatient Medications (Cardiovascular):    amLODipine  (NORVASC ) 10 MG tablet, Take 1 tablet (10 mg total) by mouth daily.   atorvastatin  (LIPITOR) 40 MG tablet, Take 1 tablet (40 mg total) by mouth daily.   carvedilol  (COREG ) 6.25 MG tablet, Take 1 tablet (6.25 mg total) by mouth 2 (two) times daily with a meal.  Current Outpatient Medications (Respiratory):    albuterol  (VENTOLIN  HFA) 108 (90 Base) MCG/ACT inhaler, Inhale 2 puffs into the lungs every 6 (six) hours as needed for wheezing or shortness of breath.   fluticasone  (FLONASE ) 50 MCG/ACT nasal spray, Place 1 spray  into both nostrils daily.   loratadine  (CLARITIN ) 10 MG tablet, Take 10 mg by mouth daily.   Tiotropium Bromide  Monohydrate (SPIRIVA  RESPIMAT) 2.5 MCG/ACT AERS, Inhale 2 puffs into the lungs daily.  Current Outpatient Medications (Analgesics):    acetaminophen  (TYLENOL ) 325 MG tablet, Take 325 mg by mouth at bedtime as needed for mild pain (pain score 1-3) or moderate pain (pain score 4-6).  Current Outpatient Medications (Hematological):    rivaroxaban  (XARELTO ) 20 MG TABS tablet, Take 1 tablet (20 mg total) by mouth daily with supper.  Current Outpatient Medications (Other):    omeprazole  (PRILOSEC) 20 MG capsule, Take 2 capsules (40 mg total) by mouth daily.  Review of Systems:  Review of system negative unless stated in the problem list or HPI.    Physical Exam:  Vitals:   12/23/23 0938 12/23/23 1004  BP: (!) 146/88 (!) 157/90  Pulse: 78 70  Temp: 98.2 F (36.8 C)   TempSrc: Oral   SpO2: 97%   Weight: 215 lb 11.2 oz (97.8 kg)    Physical Exam General: NAD HENT: NCAT Lungs: CTAB, no wheeze, rhonchi or rales.  Cardiovascular: Normal heart sounds, no r/m/g, 2+ pulses in all extremities. No LE edema Abdomen: No TTP, normal bowel sounds MSK: No asymmetry or muscle atrophy.  Skin: no lesions noted on exposed skin Neuro: Alert and oriented x4. CN grossly intact Psych: Normal mood and normal  affect   Assessment & Plan:   COPD (chronic obstructive pulmonary disease) (HCC) Pt with recent COPD exacerbation. She completed her steroid course and has one day left of prescribed antibiotic. She reports using her spiriva  as needed. Advised to use this daily as it provides little relief during an exacerbation due to reduced inspiratory effort. Pt's CAT score is 5 suggestive mild COPD. Will not step up her therapy as she was using the Spiriva  as needed instead of daily.    Morbid obesity (HCC) with BMI >35 with HTN, HLD, OSA, GERD Pt was prescribed Mounjaro  but states the co-payment is  around 500 for this. She states this is too much for her to afford. Will work with Aron Lard to see if we can find better alternative for her.   Addendum: Discussed with Aron Lard and this will be her co-payment with each refill. Better alternative for her would be Ozempic  as that has assistance program. Will send in Ozempic .   History of pulmonary embolus (PE) Pt with hx of PE on AC with Xarelto . She was given samples of Xarelto  and asked to fill out paper work to get Xarelto  approved. Pt brought completed paper work with her. Given this can take some time to process, will give her additional 2 week of samples until she can get her prescription filled. Advised 2 week follow up to ensure she doesn't have any lapse in anticoagulation. We will follow up on her blood pressure at that time.   Addendum: Pt given 2 samples of 10 mg Xarelto , so will have pt take 2 tablets per day and she will have 7 day supply. All paper work is submitted for assistance so she will likely get her prescription soon. Our office will order more samples and I advised pt to call our office next week if she still hasn't received her prescription and we can give her more samples. Pt is understanding. Teach back method used.   Essential hypertension Pt with HTN who is on amlodipine  10 mg every day and Coreg  6.25 mg BID. Her blood pressure is uncontrolled. She does not check her blood pressure at home. Advised her to check it at home and bring a log to next visit. She report having component of white coat hypertension. Will also not change her regimen as she completed course of steroid yesterday which can cause transient elevation in blood pressure. Slight creatinine elevation on last lab work but to degree to warrant repeat lab work. Follow up HTN in 2 weeks.   Healthcare maintenance Dexa Scan ordered this visit.    See Encounters Tab for problem based charting.  Patient Discussed with Dr. Starling Eck, MD Tommas Fragmin. [facility] Internal Medicine Residency, PGY-3

## 2023-12-23 NOTE — Assessment & Plan Note (Signed)
 Dexa Scan ordered this visit.

## 2023-12-23 NOTE — Assessment & Plan Note (Signed)
 Pt with hx of PE on AC with Xarelto . She was given samples of Xarelto  and asked to fill out paper work to get Xarelto  approved. Pt brought completed paper work with her. Given this can take some time to process, will give her additional 2 week of samples until she can get her prescription filled. Advised 2 week follow up to ensure she doesn't have any lapse in anticoagulation. We will follow up on her blood pressure at that time.   Addendum: Pt given 2 samples of 10 mg Xarelto , so will have pt take 2 tablets per day and she will have 7 day supply. All paper work is submitted for assistance so she will likely get her prescription soon. Our office will order more samples and I advised pt to call our office next week if she still hasn't received her prescription and we can give her more samples. Pt is understanding. Teach back method used.

## 2023-12-23 NOTE — Assessment & Plan Note (Addendum)
 Pt with recent COPD exacerbation. She completed her steroid course and has one day left of prescribed antibiotic. She reports using her spiriva  as needed. Advised to use this daily as it provides little relief during an exacerbation due to reduced inspiratory effort. Pt's CAT score is 5 suggestive mild COPD. Will not step up her therapy as she was using the Spiriva  as needed instead of daily.

## 2023-12-23 NOTE — Assessment & Plan Note (Signed)
 Pt with HTN who is on amlodipine  10 mg every day and Coreg  6.25 mg BID. Her blood pressure is uncontrolled. She does not check her blood pressure at home. Advised her to check it at home and bring a log to next visit. She report having component of white coat hypertension. Will also not change her regimen as she completed course of steroid yesterday which can cause transient elevation in blood pressure. Slight creatinine elevation on last lab work but to degree to warrant repeat lab work. Follow up HTN in 2 weeks.

## 2023-12-24 ENCOUNTER — Other Ambulatory Visit (HOSPITAL_COMMUNITY): Payer: Self-pay

## 2023-12-24 MED ORDER — SEMAGLUTIDE(0.25 OR 0.5MG/DOS) 2 MG/3ML ~~LOC~~ SOPN
0.2500 mg | PEN_INJECTOR | SUBCUTANEOUS | 0 refills | Status: DC
  Filled 2023-12-24: qty 3, 28d supply, fill #0

## 2023-12-25 ENCOUNTER — Other Ambulatory Visit (HOSPITAL_COMMUNITY): Payer: Self-pay

## 2023-12-25 ENCOUNTER — Telehealth: Payer: Self-pay | Admitting: *Deleted

## 2023-12-25 NOTE — Telephone Encounter (Signed)
 Will forward to C [place] Copied from CRM [id number]. Topic: General - Other >> Dec 25, 2023  9:03 AM Retta Caster wrote: Reason for CRM: Rakina from [facility] assistance program calling for [employer]. Needs call back. Reached out to office but was instructed to leave CRM and they will let Camela know to call back. [phone] Mon -Fri 8am-8pm EST

## 2023-12-25 NOTE — Telephone Encounter (Signed)
 Rec'd fax with info needed. All complete and updated on 12/02/23 encounter.

## 2023-12-26 ENCOUNTER — Other Ambulatory Visit (HOSPITAL_COMMUNITY): Payer: Self-pay

## 2023-12-26 NOTE — Progress Notes (Signed)
 Internal Medicine Clinic Attending  Case discussed with the resident at the time of the visit.  We reviewed the resident's history and exam and pertinent patient test results.  I agree with the assessment, diagnosis, and plan of care documented in the resident's note.

## 2023-12-31 ENCOUNTER — Telehealth: Payer: Self-pay | Admitting: *Deleted

## 2023-12-31 NOTE — Telephone Encounter (Signed)
 Patient here to pick up sample of Xarelto  20 mg tablets # 7.  Lot # [id number].    Instructions given by C. Barney Boozer o expect call from dispensing company to verify information and to do shipment to her home.  Patient voiced understanding of .

## 2024-01-06 ENCOUNTER — Other Ambulatory Visit: Payer: Self-pay

## 2024-01-06 ENCOUNTER — Ambulatory Visit (INDEPENDENT_AMBULATORY_CARE_PROVIDER_SITE_OTHER): Admitting: Internal Medicine

## 2024-01-06 VITALS — BP 134/82 | HR 75 | Temp 98.3°F | Ht 63.0 in | Wt 216.0 lb

## 2024-01-06 DIAGNOSIS — Z86711 Personal history of pulmonary embolism: Secondary | ICD-10-CM | POA: Diagnosis not present

## 2024-01-06 DIAGNOSIS — I10 Essential (primary) hypertension: Secondary | ICD-10-CM

## 2024-01-06 DIAGNOSIS — K219 Gastro-esophageal reflux disease without esophagitis: Secondary | ICD-10-CM | POA: Diagnosis not present

## 2024-01-06 DIAGNOSIS — G4733 Obstructive sleep apnea (adult) (pediatric): Secondary | ICD-10-CM

## 2024-01-06 DIAGNOSIS — Z6838 Body mass index (BMI) 38.0-38.9, adult: Secondary | ICD-10-CM | POA: Diagnosis not present

## 2024-01-06 DIAGNOSIS — E785 Hyperlipidemia, unspecified: Secondary | ICD-10-CM

## 2024-01-06 NOTE — Progress Notes (Signed)
 Internal Medicine Clinic Attending  Case discussed with the resident at the time of the visit.  We reviewed the resident's history and exam and pertinent patient test results.  I agree with the assessment, diagnosis, and plan of care documented in the resident's note.

## 2024-01-06 NOTE — Assessment & Plan Note (Signed)
 For patient's obesity that is complicated by hypertension, hyperlipidemia, OSA and GERD I prescribed her Ozempic  at last visit but she still has not heard anything regarding this.  Will reach out to pharmacy and see if there is any prior authorization that needs to be filled out.  Advised patient to follow-up on this to ensure this is resolved.

## 2024-01-06 NOTE — Assessment & Plan Note (Signed)
 Patient has been able to get her Xarelto  from the pharmacy.  She is approved for 1 year on the patient assistance program.

## 2024-01-06 NOTE — Patient Instructions (Signed)
 [name], it was a pleasure seeing you today! You endorsed feeling well today. Below are some of the things we talked about this visit. We look forward to seeing you in the follow up appointment!  Today we discussed: Continue taking your medications.   Your blood pressure is looking good. We won't change any medications. Try incorporating more walks and eating less salt to further improve it.  If it continues to stay elevated, we can add another medication.   I will work on your ozempic . Call our office, if you don't hear anything by next week.   I have ordered the following labs today:  Lab Orders  No laboratory test(s) ordered today      Referrals ordered today:   Referral Orders  No referral(s) requested today     I have ordered the following medication/changed the following medications:   Stop the following medications: There are no discontinued medications.   Start the following medications: No orders of the defined types were placed in this encounter.    Follow-up: 1 month follow up   Please make sure to arrive 15 minutes prior to your next appointment. If you arrive late, you may be asked to reschedule.   We look forward to seeing you next time. Please call our clinic at [phone] if you have any questions or concerns. The best time to call is Monday-Friday from 9am-4pm, but there is someone available 24/7. If after hours or the weekend, call the main hospital number and ask for the Internal Medicine Resident On-Call. If you need medication refills, please notify your pharmacy one week in advance and they will send us  a request.  Thank you for letting us  take part in your care. Wishing you the best!  Thank you, Jackolyn Masker, MD

## 2024-01-06 NOTE — Progress Notes (Signed)
 CC: 2 week follow up  HPI:  [name] is a 68 y.o. with medical history of HTN, HLD, Prediabetes, COPD, DVT and PE, ;and OSA presenting to [facility] for 2 week follow up  from prior visit.   Please see problem-based list for further details, assessments, and plans.  Past Medical History:  Diagnosis Date   (HFpEF) heart failure with preserved ejection fraction (HCC)    Abscess 02/12/2023   Acute kidney injury (HCC)    Acute viral syndrome 09/18/2023   Atypical chest pain 06/23/2022   Bronchitis due to COVID-19 virus 03/23/2019   DVT (deep venous thrombosis) (HCC)    Encounter for screening involving social determinants of health (SDoH) 09/06/2020   Hyperlipidemia    Hypertension    Leg pain    right   Obesity (BMI 30-39.9)    OSA (obstructive sleep apnea)    Pain and swelling of left forearm 10/02/2022   PE (pulmonary embolism)    Prediabetes    Sleep apnea    Trigger finger, left ring finger 01/04/2022   Upper airway cough syndrome 04/20/2019    Current Outpatient Medications (Endocrine & Metabolic):    Semaglutide ,0.25 or 0.5MG /DOS, 2 MG/3ML SOPN, Inject 0.25 mg into the skin once a week.  Current Outpatient Medications (Cardiovascular):    amLODipine  (NORVASC ) 10 MG tablet, Take 1 tablet (10 mg total) by mouth daily.   atorvastatin  (LIPITOR) 40 MG tablet, Take 1 tablet (40 mg total) by mouth daily.   carvedilol  (COREG ) 6.25 MG tablet, Take 1 tablet (6.25 mg total) by mouth 2 (two) times daily with a meal.  Current Outpatient Medications (Respiratory):    albuterol  (VENTOLIN  HFA) 108 (90 Base) MCG/ACT inhaler, Inhale 2 puffs into the lungs every 6 (six) hours as needed for wheezing or shortness of breath.   fluticasone  (FLONASE ) 50 MCG/ACT nasal spray, Place 1 spray into both nostrils daily.   loratadine  (CLARITIN ) 10 MG tablet, Take 10 mg by mouth daily.   Tiotropium Bromide  Monohydrate (SPIRIVA  RESPIMAT) 2.5 MCG/ACT AERS, Inhale 2 puffs into the lungs  daily.  Current Outpatient Medications (Analgesics):    acetaminophen  (TYLENOL ) 325 MG tablet, Take 325 mg by mouth at bedtime as needed for mild pain (pain score 1-3) or moderate pain (pain score 4-6).  Current Outpatient Medications (Hematological):    rivaroxaban  (XARELTO ) 20 MG TABS tablet, Take 1 tablet (20 mg total) by mouth daily with supper.  Current Outpatient Medications (Other):    omeprazole  (PRILOSEC) 20 MG capsule, Take 2 capsules (40 mg total) by mouth daily.  Review of Systems:  Review of system negative unless stated in the problem list or HPI.    Physical Exam:  Vitals:   01/06/24 0839  BP: 134/82  Pulse: 75  Temp: 98.3 F (36.8 C)  TempSrc: Oral  SpO2: 92%  Weight: 216 lb (98 kg)  Height: 5\' 3"  (1.6 m)   Physical Exam General: NAD HENT: NCAT Lungs: CTAB, no wheeze, rhonchi or rales.  Cardiovascular: Normal heart sounds, no r/m/g, 2+ pulses in all extremities. No LE edema Abdomen: No TTP, normal bowel sounds MSK: No asymmetry or muscle atrophy.  Skin: no lesions noted on exposed skin Neuro: Alert and oriented x4. CN grossly intact Psych: Normal mood and normal affect   Assessment & Plan:   Essential hypertension Well-controlled with amlodipine  10 mg and Coreg  6.25 mg twice daily.  She is not checking blood pressures at home, advised her to check her blood pressures at home.  I  did provide her with lifestyle modifications that can decrease her blood pressure further.  If she is elevated at subsequent visits, we can decrease her amlodipine  to 5 mg and add chlorthalidone .  History of pulmonary embolus (PE) Patient has been able to get her Xarelto  from the pharmacy.  She is approved for 1 year on the patient assistance program.  Morbid obesity (HCC) with BMI >35 with HTN, HLD, OSA, GERD For patient's obesity that is complicated by hypertension, hyperlipidemia, OSA and GERD I prescribed her Ozempic  at last visit but she still has not heard anything  regarding this.  Will reach out to pharmacy and see if there is any prior authorization that needs to be filled out.  Advised patient to follow-up on this to ensure this is resolved.   See Encounters Tab for problem based charting.  Patient Discussed with Dr. Blythe Wallowa, MD Tommas Fragmin. [facility] Internal Medicine Residency, PGY-3

## 2024-01-06 NOTE — Assessment & Plan Note (Signed)
 Well-controlled with amlodipine  10 mg and Coreg  6.25 mg twice daily.  She is not checking blood pressures at home, advised her to check her blood pressures at home.  I did provide her with lifestyle modifications that can decrease her blood pressure further.  If she is elevated at subsequent visits, we can decrease her amlodipine  to 5 mg and add chlorthalidone .

## 2024-01-26 ENCOUNTER — Encounter: Admitting: Student

## 2024-02-05 ENCOUNTER — Ambulatory Visit: Admitting: Student

## 2024-02-05 ENCOUNTER — Other Ambulatory Visit (HOSPITAL_COMMUNITY): Payer: Self-pay

## 2024-02-05 ENCOUNTER — Telehealth: Payer: Self-pay

## 2024-02-05 VITALS — BP 181/105 | HR 78 | Temp 98.0°F | Ht 66.0 in | Wt 217.6 lb

## 2024-02-05 DIAGNOSIS — Z6835 Body mass index (BMI) 35.0-35.9, adult: Secondary | ICD-10-CM

## 2024-02-05 DIAGNOSIS — G4733 Obstructive sleep apnea (adult) (pediatric): Secondary | ICD-10-CM | POA: Diagnosis not present

## 2024-02-05 DIAGNOSIS — K219 Gastro-esophageal reflux disease without esophagitis: Secondary | ICD-10-CM | POA: Diagnosis not present

## 2024-02-05 DIAGNOSIS — Z86711 Personal history of pulmonary embolism: Secondary | ICD-10-CM

## 2024-02-05 DIAGNOSIS — E785 Hyperlipidemia, unspecified: Secondary | ICD-10-CM

## 2024-02-05 DIAGNOSIS — I10 Essential (primary) hypertension: Secondary | ICD-10-CM | POA: Diagnosis not present

## 2024-02-05 MED ORDER — TIRZEPATIDE-WEIGHT MANAGEMENT 2.5 MG/0.5ML ~~LOC~~ SOAJ
2.5000 mg | SUBCUTANEOUS | 3 refills | Status: AC
  Filled 2024-02-05: qty 2, 28d supply, fill #0

## 2024-02-05 MED ORDER — AMLODIPINE BESYLATE 10 MG PO TABS
10.0000 mg | ORAL_TABLET | Freq: Every day | ORAL | 11 refills | Status: AC
  Filled 2024-02-05: qty 30, 30d supply, fill #0
  Filled 2024-03-11: qty 30, 30d supply, fill #1
  Filled 2024-04-22: qty 30, 30d supply, fill #2
  Filled 2024-05-27: qty 30, 30d supply, fill #3
  Filled 2024-07-05: qty 30, 30d supply, fill #4
  Filled 2024-08-11: qty 30, 30d supply, fill #5

## 2024-02-05 NOTE — Assessment & Plan Note (Signed)
 Multiple prior PE since 2010 and intermittent adherence to anticoagulation due to cost with an episode of left ulnar vein DVT when off anticoagulation.  Hypercoagulable testing in 2012 was negative for factor V Leiden but further testing while off anticoagulation has not been done.  She is currently approved for 1 year of patient assistance for her Xarelto  so we can discuss her hypercoagulable testing sometime this year.  She is not currently interested in stopping the Xarelto . - Continue Xarelto  20 mg daily

## 2024-02-05 NOTE — Progress Notes (Signed)
   CC: Routine Follow Up for hypertension after last office visit 01/06/2024  HPI:  [name] is a 68 y.o. 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 Please see assessment and plan below for further details.  Medications: Current Outpatient Medications  Medication Instructions   acetaminophen  (TYLENOL ) 325 mg, At bedtime PRN   albuterol  (VENTOLIN  HFA) 108 (90 Base) MCG/ACT inhaler 2 puffs, Inhalation, Every 6 hours PRN   amLODipine  (NORVASC ) 10 mg, Oral, Daily   atorvastatin  (LIPITOR) 40 mg, Oral, Daily   carvedilol  (COREG ) 6.25 mg, Oral, 2 times daily with meals   fluticasone  (FLONASE ) 50 MCG/ACT nasal spray 1 spray, Each Nare, Daily   loratadine  (CLARITIN ) 10 mg, Daily   omeprazole  (PRILOSEC) 40 mg, Oral, Daily   Tiotropium Bromide  Monohydrate (SPIRIVA  RESPIMAT) 2.5 MCG/ACT AERS 2 puffs, Inhalation, Daily   tirzepatide  (ZEPBOUND ) 2.5 mg, Subcutaneous, Weekly   Xarelto  20 mg, Oral, Daily with supper     Review of Systems:   Pertinent items noted in HPI and/or A&P.  Physical Exam:  Vitals:   02/05/24 0909  BP: (!) 167/91  Pulse: 93  Temp: 98 F (36.7 C)  TempSrc: Oral  SpO2: 90%  Weight: 217 lb 9.6 oz (98.7 kg)  Height: 5' 6 (1.676 m)    Constitutional: Well-appearing elderly female. In no acute distress. HEENT: Normocephalic, atraumatic, Sclera non-icteric, PERRL, EOM intact Cardio:Regular rate and rhythm. 2+ bilateral radial pulses. Pulm:Clear to auscultation bilaterally. Normal work of breathing on room air. MSK: Trace lower extremity edema bilaterally Skin:Warm and dry. Neuro:Alert and oriented x3. No focal deficit noted. Psych:Pleasant mood and affect.   Assessment & Plan:   History of pulmonary embolus (PE) Multiple prior PE since 2010 and intermittent adherence to anticoagulation due to cost with an episode of left ulnar vein DVT when off anticoagulation.   Hypercoagulable testing in 2012 was negative for factor V Leiden but further testing while off anticoagulation has not been done.  She is currently approved for 1 year of patient assistance for her Xarelto  so we can discuss her hypercoagulable testing sometime this year.  She is not currently interested in stopping the Xarelto . - Continue Xarelto  20 mg daily  Essential hypertension Blood pressure not well-controlled today at 167/91 however she has been out of amlodipine  for about 1 week and did not take her carvedilol  this morning.  She has not yet picked up her blood pressure cuff but is provided with options and [email or web address] today.  Also sent in a refill of her amlodipine . - Continue amlodipine  10 mg daily and carvedilol  6.25 mg twice daily - Telehealth in 1 month to review blood pressure log - Would recommend adding a medication like chlorthalidone  next  Morbid obesity (HCC) with BMI >35 with HTN, HLD, OSA, GERD Patient with obesity (BMI of 35), OSA (AHI of 24.9 in 2006), currently on CPAP at home, and prediabetic (A1c 6.1 in March 2025).  With her concomitant OSA and obesity she would benefit from a GLP-1 agonist.  She has tried lifestyle modifications without significant improvement. - Start tirzepatide  2.5 mg weekly    Patient discussed with Dr. Bevelyn Bryant  Cleven Dallas, DO [facility] Internal Medicine Resident PGY-2 Clinic Phone: [phone] Please contact the on call pager at [phone] for any urgent or emergent needs.

## 2024-02-05 NOTE — Patient Instructions (Addendum)
 Thank you, Ms.Lazaro Prime, for allowing us  to provide your care today. Today we discussed . . .  > High blood pressure       - Your blood pressure is high here however that is not a surprise since you have been out of your amlodipine  and have not taken the carvedilol  this morning.  I want you to pick up a blood pressure cuff either from the pharmacy or from [employer].  You can check that the brand is reliable on [employer].  I have also sent in a refill of your amlodipine  that you can pick up today and start taking.  We will plan to have a telehealth visit in about 1 month to review your blood pressure readings.  If you do have readings that are above 160 on the top consistently or 100 on the bottom consistently please let us  know sooner.  Also if you have values that are below 80 on the top or 60 on the bottom consistently especially if you have any symptoms such as dizziness or lightheadedness please let us  know right away. > Weight loss and sleep apnea       - I am sorry that the Ozempic  medicine was very expensive.  I have sent in a new medication called tirzepatide  (Zepbound ) that could be covered for sleep apnea.  Please let us  know if this medication is too expensive as it may need a prior authorization.  Will plan to discuss this at your telehealth to make sure that if the medication is covered you are able to pick it up.  Follow up: 1 month for telehealth to discuss blood pressure log    Remember:  Should you have any questions or concerns please call the internal medicine clinic at [phone].     Cleven Dallas, DO [facility]

## 2024-02-05 NOTE — Telephone Encounter (Signed)
 ERROR

## 2024-02-05 NOTE — Assessment & Plan Note (Signed)
 Blood pressure not well-controlled today at 167/91 however she has been out of amlodipine  for about 1 week and did not take her carvedilol  this morning.  She has not yet picked up her blood pressure cuff but is provided with options and [email or web address] today.  Also sent in a refill of her amlodipine . - Continue amlodipine  10 mg daily and carvedilol  6.25 mg twice daily - Telehealth in 1 month to review blood pressure log - Would recommend adding a medication like chlorthalidone  next

## 2024-02-05 NOTE — Assessment & Plan Note (Addendum)
 Patient with obesity (BMI of 35), OSA (AHI of 24.9 in 2006), currently on CPAP at home, and prediabetic (A1c 6.1 in March 2025).  With her concomitant OSA and obesity she would benefit from a GLP-1 agonist.  She has tried lifestyle modifications without significant improvement. - Start tirzepatide  2.5 mg weekly

## 2024-02-06 ENCOUNTER — Encounter: Payer: Self-pay | Admitting: *Deleted

## 2024-02-09 NOTE — Progress Notes (Signed)
 Internal Medicine Clinic Attending  Case discussed with the resident at the time of the visit.  We reviewed the resident's history and exam and pertinent patient test results.  I agree with the assessment, diagnosis, and plan of care documented in the resident's note.

## 2024-02-12 ENCOUNTER — Other Ambulatory Visit (HOSPITAL_COMMUNITY): Payer: Self-pay

## 2024-02-17 ENCOUNTER — Other Ambulatory Visit (HOSPITAL_COMMUNITY): Payer: Self-pay

## 2024-06-30 NOTE — Progress Notes (Signed)
 [facility] [address] [place].  [address] [phone]       OFFICE FOLLOW UP NOTE  [name] Date of Birth:  1956-08-15 Medical Record Number:  [id number]    Primary neurologist: Dr. Buck Reason for visit: CPAP follow-up    SUBJECTIVE:   CHIEF COMPLAINT:  Chief Complaint  Patient presents with   Follow-up    Pt in 3 alone Pt here for cpap f/u Pt states no questions or concerns for todays visit      Follow-up visit:  Prior visit: 12/02/2023  Brief HPI:   [name] is a 68 y.o. female with complex medical history who was evaluated by Dr. Buck in 07/2023 for evaluation of prior diagnosis of OSA. Prior dx of OSA in 2006 but has not been on tx over the past 5 years. ESS 18/24. Sleep study 08/2023 showed overall mild sleep apnea more pronounced in REM sleep, with total AHI of 6.4/hr, REM AHI of 22.7/h and O2 nadir of 74% with evidence of nocturnal hypoxemia. AutoPAP initiated 09/2023.     Interval history:  Patient returns for CPAP compliance visit.  Reports overall doing well with CPAP therapy.  Currently tolerating well and notes improvement of daytime energy levels and sleep quality.  ESS 3/24, at initial CPAP compliance visit 15/24 and prior to CPAP 18/24.  She was never contacted to complete ONO.  She was unable to start GLP-1 due to high co-pay.  No questions or concerns at this time.         ROS:   14 system review of systems performed and negative with exception of those listed in HPI  PMH:  Past Medical History:  Diagnosis Date   (HFpEF) heart failure with preserved ejection fraction (HCC)    Abscess 02/12/2023   Acute kidney injury    Acute viral syndrome 09/18/2023   Atypical chest pain 06/23/2022   Bronchitis due to COVID-19 virus 03/23/2019   DVT (deep venous thrombosis) (HCC)    Encounter for screening involving social determinants of health (SDoH) 09/06/2020   Hyperlipidemia    Hypertension     Leg pain    right   Obesity (BMI 30-39.9)    OSA (obstructive sleep apnea)    Pain and swelling of left forearm 10/02/2022   PE (pulmonary embolism)    Prediabetes    Sleep apnea    Trigger finger, left ring finger 01/04/2022   Upper airway cough syndrome 04/20/2019    PSH:  Past Surgical History:  Procedure Laterality Date   ABDOMINAL HYSTERECTOMY     APPENDECTOMY     BACK SURGERY     CHOLECYSTECTOMY     Gall Bladder removal   COLONOSCOPY WITH PROPOFOL  N/A 05/06/2017   Procedure: COLONOSCOPY WITH PROPOFOL ;  Surgeon: Dianna Specking, MD;  Location: [facility];  Service: Endoscopy;  Laterality: N/A;   LAMINOTOMY     right at L4-5 with a disc bulge and superimposed right lateral recess protrusion encroaching on the L5 root   SPINE SURGERY     decompression surgery    Social History:  Social History   Socioeconomic History   Marital status: Legally Separated    Spouse name: Not on file   Number of children: 2   Years of education: Not on file   Highest education level: Not on file  Occupational History   Not on file  Tobacco Use   Smoking status: Never   Smokeless tobacco: Never  Vaping Use  Vaping status: Never Used  Substance and Sexual Activity   Alcohol use: No   Drug use: Never   Sexual activity: Not Currently  Other Topics Concern   Not on file  Social History Narrative   Caffiene tea 1 large cup   Working retired:  Production Designer, 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 (grankid 14, greatkids 15)   Social Drivers of Corporate Investment Banker Strain: Medium Risk (01/06/2024)   Overall Financial Resource Strain (CARDIA)    Difficulty of Paying Living Expenses: Somewhat hard  Food Insecurity: No Food Insecurity (01/06/2024)   Hunger Vital Sign    Worried About Running Out of Food in the Last Year: Never true    Ran Out of Food in the Last Year: Never true  Transportation Needs: No Transportation Needs (01/06/2024)   PRAPARE - Scientist, Research (physical Sciences) (Medical): No    Lack of Transportation (Non-Medical): No  Physical Activity: Inactive (01/06/2024)   Exercise Vital Sign    Days of Exercise per Week: 0 days    Minutes of Exercise per Session: 0 min  Stress: No Stress Concern Present (07/02/2022)   [employer] of Occupational Health - Occupational Stress Questionnaire    Feeling of Stress : Only a little  Social Connections: Moderately Integrated (01/06/2024)   Social Connection and Isolation Panel    Frequency of Communication with Friends and Family: Twice a week    Frequency of Social Gatherings with Friends and Family: Once a week    Attends Religious Services: More than 4 times per year    Active Member of [employer] or Organizations: No    Attends Banker Meetings: Never    Marital Status: Married  Catering Manager Violence: Not At Risk (01/06/2024)   Humiliation, Afraid, Rape, and Kick questionnaire    Fear of Current or Ex-Partner: No    Emotionally Abused: No    Physically Abused: No    Sexually Abused: No    Family History:  Family History  Problem Relation Age of Onset   Heart attack Mother    Heart attack Father    Heart attack Sister    Hypertension Other    Heart disease Other    Diabetes Other    Coronary artery disease Other    Sleep apnea Neg Hx     Medications:   Current Outpatient Medications on File Prior to Visit  Medication Sig Dispense Refill   acetaminophen  (TYLENOL ) 325 MG tablet Take 325 mg by mouth at bedtime as needed for mild pain (pain score 1-3) or moderate pain (pain score 4-6).     albuterol  (VENTOLIN  HFA) 108 (90 Base) MCG/ACT inhaler Inhale 2 puffs into the lungs every 6 (six) hours as needed for wheezing or shortness of breath. 6.7 g 2   amLODipine  (NORVASC ) 10 MG tablet Take 1 tablet (10 mg total) by mouth daily. 30 tablet 11   atorvastatin  (LIPITOR) 40 MG tablet Take 1 tablet (40 mg total) by mouth daily. 90 tablet 3   carvedilol  (COREG ) 6.25 MG tablet Take  1 tablet (6.25 mg total) by mouth 2 (two) times daily with a meal. 180 tablet 3   fluticasone  (FLONASE ) 50 MCG/ACT nasal spray Place 1 spray into both nostrils daily. 16 g 2   loratadine  (CLARITIN ) 10 MG tablet Take 10 mg by mouth daily.     omeprazole  (PRILOSEC) 20 MG capsule Take 2 capsules (40 mg total) by mouth daily. 180 capsule 3   rivaroxaban  (  XARELTO ) 20 MG TABS tablet Take 1 tablet (20 mg total) by mouth daily with supper. 90 tablet 3   Tiotropium Bromide  Monohydrate (SPIRIVA  RESPIMAT) 2.5 MCG/ACT AERS Inhale 2 puffs into the lungs daily. 4 g 3   tirzepatide  (ZEPBOUND ) 2.5 MG/0.5ML Pen Inject 2.5 mg into the skin once a week. 2 mL 3   No current facility-administered medications on file prior to visit.    Allergies:   Allergies  Allergen Reactions   Tramadol  Nausea And Vomiting   Ciprofloxacin Rash      OBJECTIVE:  Physical Exam  Vitals:   07/05/24 1453  BP: 134/76  Pulse: 63  Weight: 215 lb (97.5 kg)  Height: 5' 3 (1.6 m)    Body mass index is 38.09 kg/m. No results found.  General: well developed, well nourished, very pleasant middle-aged female, seated, in no evident distress Head: head normocephalic and atraumatic.   Neck: supple with no carotid or supraclavicular bruits Cardiovascular: regular rate and rhythm, no murmurs  Neurologic Exam Mental Status: Awake and fully alert. Oriented to place and time. Recent and remote memory intact. Attention span, concentration and fund of knowledge appropriate. Mood and affect appropriate.  Cranial Nerves: Pupils equal, briskly reactive to light. Extraocular movements full without nystagmus. Visual fields full to confrontation. Hearing intact. Facial sensation intact. Face, tongue, palate moves normally and symmetrically.  Motor: Normal bulk and tone. Normal strength in all tested extremity muscles Gait and Station: Arises from chair without difficulty. Stance is normal. Gait demonstrates normal stride length and  balance without use of AD.         ASSESSMENT/PLAN: [name] is a 68 y.o. year old female    OSA on CPAP :  Nocturnal hypoxemia: Excessive daytime sleepiness, improved: Compliance report shows satisfactory usage with optimal residual AHI.   Continue current pressure settings of 7-13.   Will place order again to  completion of ONO while on CPAP to assess for resolution of nocturnal hypoxemia on CPAP therapy Lab work for reversible causes of daytime sleepiness unremarkable Discussed continued nightly usage with ensuring greater than 4 hours nightly for optimal benefit and per insurance purposes.   Continue to follow with DME company for any needed supplies or CPAP related concerns CPAP set up 09/2023     Follow up in 1 year or call earlier if needed   CC:  PCP: D'Mello, Rosalyn, DO      Harlene Bogaert, AGNP-BC  [facility] [address] [place], [place] [address]  Phone [phone] Fax [phone] Note: This document was prepared with digital dictation and possible smart phrase technology. Any transcriptional errors that result from this process are unintentional.

## 2024-07-05 ENCOUNTER — Encounter: Payer: Self-pay | Admitting: Adult Health

## 2024-07-05 ENCOUNTER — Ambulatory Visit: Admitting: Adult Health

## 2024-07-05 ENCOUNTER — Other Ambulatory Visit: Payer: Self-pay

## 2024-07-05 VITALS — BP 134/76 | HR 63 | Ht 63.0 in | Wt 215.0 lb

## 2024-07-05 DIAGNOSIS — G4734 Idiopathic sleep related nonobstructive alveolar hypoventilation: Secondary | ICD-10-CM

## 2024-07-05 DIAGNOSIS — G4733 Obstructive sleep apnea (adult) (pediatric): Secondary | ICD-10-CM | POA: Diagnosis not present

## 2024-07-05 NOTE — Patient Instructions (Addendum)
 Your Plan:  Continue nightly use of CPAP  Please follow up with [facility] regarding completing overnight oximetry test to ensure normal oxygen levels while on CPAP therapy    Follow up in 1 year or call earlier if needed     Thank you for coming to see us  at [facility]. I hope we have been able to provide you high quality care today.  You may receive a patient satisfaction survey over the next few weeks. We would appreciate your feedback and comments so that we may continue to improve ourselves and the health of our patients.

## 2024-07-08 ENCOUNTER — Other Ambulatory Visit: Payer: Self-pay | Admitting: Internal Medicine

## 2024-07-08 ENCOUNTER — Other Ambulatory Visit (HOSPITAL_COMMUNITY): Payer: Self-pay

## 2024-07-08 DIAGNOSIS — J449 Chronic obstructive pulmonary disease, unspecified: Secondary | ICD-10-CM

## 2024-07-08 MED ORDER — UMECLIDINIUM BROMIDE 62.5 MCG/ACT IN AEPB
1.0000 | INHALATION_SPRAY | Freq: Every day | RESPIRATORY_TRACT | 1 refills | Status: AC
  Filled 2024-07-08: qty 90, 90d supply, fill #0

## 2024-07-08 MED ORDER — SPIRIVA RESPIMAT 2.5 MCG/ACT IN AERS
2.0000 | INHALATION_SPRAY | Freq: Every day | RESPIRATORY_TRACT | 3 refills | Status: DC
  Filled 2024-07-08: qty 4, 30d supply, fill #0

## 2024-07-08 NOTE — Telephone Encounter (Signed)
 I called the patient I was unable to reach her,her voicemail is full.

## 2024-07-08 NOTE — Progress Notes (Signed)
 Was informed that patient's inhaler would be around $500. Will try incruse ellipta  to be in the same class but this may require prior authorization. Will contact office staff to set up follow up for patient to discuss COPD management

## 2024-07-08 NOTE — Telephone Encounter (Signed)
 Medication sent to pharmacy

## 2024-07-28 ENCOUNTER — Ambulatory Visit

## 2024-07-30 ENCOUNTER — Telehealth: Payer: Self-pay

## 2024-07-30 NOTE — Telephone Encounter (Signed)
 [name] (Key: [id number]) Zepbound  2.5MG /0.5ML pen-injectors Form Control And Instrumentation Engineer PA Form Created 5 minutes ago Sent to Plan 4 minutes ago Plan Response 4 minutes ago Submit Clinical Questions 1 minute ago Determination Favorable 1 minute ago Message from Plan PA Case: [id number], Status: Approved, Coverage Starts on: 08/20/2023 12:00:00 AM, Coverage Ends on: 08/18/2025 12:00:00 AM. Questions? Contact [phone].SABRA Authorization Expiration Date: August 18, 2025.

## 2024-07-30 NOTE — Telephone Encounter (Signed)
 Prior Authorization for patient (Zepbound  2.5MG /0.5ML pen-injectors) came through on cover my meds was submitted with last office notes awaiting approval or denial.  XZB:[id number]

## 2024-08-11 ENCOUNTER — Telehealth: Payer: Self-pay

## 2024-08-11 ENCOUNTER — Other Ambulatory Visit: Payer: Self-pay

## 2024-08-11 ENCOUNTER — Other Ambulatory Visit (HOSPITAL_COMMUNITY): Payer: Self-pay

## 2024-08-11 ENCOUNTER — Ambulatory Visit

## 2024-08-11 DIAGNOSIS — I10 Essential (primary) hypertension: Secondary | ICD-10-CM

## 2024-08-11 NOTE — Telephone Encounter (Signed)
 Copied from CRM #[id number]. Topic: Appointments - Scheduling Inquiry for Clinic >> Aug 11, 2024  9:24 AM Graeme ORN wrote: Reason for CRM: Patient called back. Missed call. Looks like appt for AWV over the phone 9:10. States she thought it was 9:50. Thank You

## 2024-08-16 ENCOUNTER — Other Ambulatory Visit (HOSPITAL_COMMUNITY): Payer: Self-pay

## 2024-08-16 MED ORDER — ATORVASTATIN CALCIUM 40 MG PO TABS
40.0000 mg | ORAL_TABLET | Freq: Every day | ORAL | 3 refills | Status: AC
  Filled 2024-08-16: qty 90, 90d supply, fill #0

## 2024-08-16 NOTE — Telephone Encounter (Signed)
 Medication sent to pharmacy

## 2024-08-23 NOTE — Telephone Encounter (Signed)
 No answer-called to schedule AWV w/ [facility].

## 2024-08-24 ENCOUNTER — Telehealth: Payer: Self-pay | Admitting: *Deleted

## 2024-08-24 NOTE — Telephone Encounter (Signed)
 Spoke  to Daughter on [facility]) please have patiebt to call [facility]  Jessica ,NP placed order for ONO 06/2024 DME has called several times no success Please call [facility] for ONO testing . Daughter states will call patient today to make her aware

## 2024-08-25 NOTE — Telephone Encounter (Signed)
 Patient returned phone call, relayed message, informed patient of [facility] phone number. Patient verbalized understand and will call [facility].

## 2024-09-07 ENCOUNTER — Telehealth: Payer: Self-pay

## 2024-09-07 NOTE — Telephone Encounter (Signed)
 Patient currently enrolled with [employer] patient assistance company for Xarelto  assistance until 12/28/24.  Received a re-enrollment approval letter from company 08/19/24 for continued enrollment for 2026:

## 2024-09-08 ENCOUNTER — Other Ambulatory Visit (HOSPITAL_COMMUNITY): Payer: Self-pay

## 2024-09-08 ENCOUNTER — Other Ambulatory Visit: Payer: Self-pay

## 2024-09-08 DIAGNOSIS — K219 Gastro-esophageal reflux disease without esophagitis: Secondary | ICD-10-CM

## 2024-09-08 MED ORDER — OMEPRAZOLE 20 MG PO CPDR
40.0000 mg | DELAYED_RELEASE_CAPSULE | Freq: Every day | ORAL | 3 refills | Status: AC
  Filled 2024-09-08: qty 180, 90d supply, fill #0

## 2024-09-08 NOTE — Telephone Encounter (Signed)
 Medication sent to pharmacy

## 2024-09-24 ENCOUNTER — Telehealth: Payer: Self-pay | Admitting: Pharmacy Technician

## 2024-09-24 NOTE — Progress Notes (Cosign Needed)
" ° °  09/24/2024  Patient ID: Ronal LITTIE Salaam, female   DOB: [birth year], 69 y.o.   MRN: [id number]  Pharmacy Quality Measure Review  This patient failed the 2025 adherence measure for cholesterol (statin) medications this calendar year.   Medication: Atorvastatin  Last fill date: 08/16/24 for 90 day supply. Previous fills of 90 on 9/4 and 12/01/23  Insurance report was not up to date. No action needed at this time. Will follow up on adherence.  Colbie Sliker, CPhT [facility] Office: [phone] Email: [email or web address]   "

## 2025-07-05 ENCOUNTER — Ambulatory Visit: Admitting: Adult Health
